# Patient Record
Sex: Female | Born: 1955 | Race: Black or African American | Hispanic: No | Marital: Married | Smoking: Never smoker
Health system: Southern US, Community
[De-identification: ages and names within clinical notes are randomized; demographics above are authoritative.]

## PROBLEM LIST (undated history)

## (undated) DIAGNOSIS — E119 Type 2 diabetes mellitus without complications: Secondary | ICD-10-CM

## (undated) DIAGNOSIS — G4730 Sleep apnea, unspecified: Secondary | ICD-10-CM

## (undated) DIAGNOSIS — T7800XA Anaphylactic reaction due to unspecified food, initial encounter: Secondary | ICD-10-CM

## (undated) DIAGNOSIS — G4733 Obstructive sleep apnea (adult) (pediatric): Secondary | ICD-10-CM

## (undated) DIAGNOSIS — Z9622 Myringotomy tube(s) status: Secondary | ICD-10-CM

## (undated) DIAGNOSIS — J324 Chronic pansinusitis: Secondary | ICD-10-CM

## (undated) DIAGNOSIS — J339 Nasal polyp, unspecified: Secondary | ICD-10-CM

## (undated) DIAGNOSIS — N189 Chronic kidney disease, unspecified: Secondary | ICD-10-CM

## (undated) DIAGNOSIS — H1010 Acute atopic conjunctivitis, unspecified eye: Secondary | ICD-10-CM

## (undated) DIAGNOSIS — M8580 Other specified disorders of bone density and structure, unspecified site: Secondary | ICD-10-CM

## (undated) DIAGNOSIS — T7840XA Allergy, unspecified, initial encounter: Secondary | ICD-10-CM

## (undated) DIAGNOSIS — J45909 Unspecified asthma, uncomplicated: Secondary | ICD-10-CM

## (undated) DIAGNOSIS — H9220 Otorrhagia, unspecified ear: Secondary | ICD-10-CM

## (undated) DIAGNOSIS — J449 Chronic obstructive pulmonary disease, unspecified: Secondary | ICD-10-CM

## (undated) DIAGNOSIS — I10 Essential (primary) hypertension: Secondary | ICD-10-CM

## (undated) DIAGNOSIS — D259 Leiomyoma of uterus, unspecified: Secondary | ICD-10-CM

## (undated) DIAGNOSIS — H7112 Cholesteatoma of tympanum, left ear: Secondary | ICD-10-CM

## (undated) HISTORY — DX: Otorrhagia, unspecified ear: H92.20

## (undated) HISTORY — DX: Other specified disorders of bone density and structure, unspecified site: M85.80

## (undated) HISTORY — PX: DILATION AND CURETTAGE OF UTERUS: SHX78

## (undated) HISTORY — DX: Obstructive sleep apnea (adult) (pediatric): G47.33

## (undated) HISTORY — DX: Unspecified asthma, uncomplicated: J45.909

## (undated) HISTORY — PX: TYMPANOSTOMY TUBE PLACEMENT: SHX32

## (undated) HISTORY — PX: NASAL SINUS SURGERY: SHX719

## (undated) HISTORY — DX: Sleep apnea, unspecified: G47.30

## (undated) HISTORY — DX: Anaphylactic reaction due to unspecified food, initial encounter: T78.00XA

## (undated) HISTORY — DX: Essential (primary) hypertension: I10

## (undated) HISTORY — DX: Allergic rhinitis, unspecified: H10.10

## (undated) HISTORY — DX: Type 2 diabetes mellitus without complications: E11.9

## (undated) HISTORY — DX: Allergy, unspecified, initial encounter: T78.40XA

## (undated) HISTORY — PX: COLONOSCOPY: SHX174

## (undated) HISTORY — DX: Chronic pansinusitis: J32.4

## (undated) HISTORY — DX: Cholesteatoma of tympanum, left ear: H71.12

## (undated) HISTORY — DX: Chronic obstructive pulmonary disease, unspecified: J44.9

## (undated) HISTORY — DX: Leiomyoma of uterus, unspecified: D25.9

## (undated) HISTORY — DX: Chronic kidney disease, unspecified: N18.9

## (undated) HISTORY — DX: Myringotomy tube(s) status: Z96.22

## (undated) HISTORY — DX: Nasal polyp, unspecified: J33.9

---
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
## 1998-01-07 ENCOUNTER — Ambulatory Visit (HOSPITAL_COMMUNITY): Admission: RE | Admit: 1998-01-07 | Discharge: 1998-01-07 | Payer: Self-pay | Admitting: Obstetrics & Gynecology

## 1998-08-14 ENCOUNTER — Ambulatory Visit (HOSPITAL_COMMUNITY): Admission: RE | Admit: 1998-08-14 | Discharge: 1998-08-14 | Payer: Self-pay | Admitting: Obstetrics & Gynecology

## 1998-08-14 ENCOUNTER — Encounter: Payer: Self-pay | Admitting: Obstetrics & Gynecology

## 2000-10-26 ENCOUNTER — Other Ambulatory Visit: Admission: RE | Admit: 2000-10-26 | Discharge: 2000-10-26 | Payer: Self-pay | Admitting: Gynecology

## 2000-11-09 ENCOUNTER — Ambulatory Visit (HOSPITAL_COMMUNITY): Admission: RE | Admit: 2000-11-09 | Discharge: 2000-11-09 | Payer: Self-pay | Admitting: Gynecology

## 2000-11-09 ENCOUNTER — Encounter: Payer: Self-pay | Admitting: Gynecology

## 2001-11-28 ENCOUNTER — Ambulatory Visit (HOSPITAL_COMMUNITY): Admission: RE | Admit: 2001-11-28 | Discharge: 2001-11-28 | Payer: Self-pay | Admitting: Gynecology

## 2001-11-28 ENCOUNTER — Encounter: Payer: Self-pay | Admitting: Gynecology

## 2001-12-20 ENCOUNTER — Other Ambulatory Visit: Admission: RE | Admit: 2001-12-20 | Discharge: 2001-12-20 | Payer: Self-pay | Admitting: Gynecology

## 2002-12-01 ENCOUNTER — Encounter: Payer: Self-pay | Admitting: Gynecology

## 2002-12-01 ENCOUNTER — Ambulatory Visit (HOSPITAL_COMMUNITY): Admission: RE | Admit: 2002-12-01 | Discharge: 2002-12-01 | Payer: Self-pay | Admitting: Gynecology

## 2003-01-03 ENCOUNTER — Other Ambulatory Visit: Admission: RE | Admit: 2003-01-03 | Discharge: 2003-01-03 | Payer: Self-pay | Admitting: Gynecology

## 2003-07-03 ENCOUNTER — Other Ambulatory Visit: Admission: RE | Admit: 2003-07-03 | Discharge: 2003-07-03 | Payer: Self-pay | Admitting: Gynecology

## 2003-12-18 ENCOUNTER — Ambulatory Visit (HOSPITAL_COMMUNITY): Admission: RE | Admit: 2003-12-18 | Discharge: 2003-12-18 | Payer: Self-pay | Admitting: Gynecology

## 2004-01-30 ENCOUNTER — Other Ambulatory Visit: Admission: RE | Admit: 2004-01-30 | Discharge: 2004-01-30 | Payer: Self-pay | Admitting: Gynecology

## 2005-02-02 ENCOUNTER — Ambulatory Visit (HOSPITAL_COMMUNITY): Admission: RE | Admit: 2005-02-02 | Discharge: 2005-02-02 | Payer: Self-pay | Admitting: Gynecology

## 2005-02-02 ENCOUNTER — Other Ambulatory Visit: Admission: RE | Admit: 2005-02-02 | Discharge: 2005-02-02 | Payer: Self-pay | Admitting: Gynecology

## 2006-02-04 ENCOUNTER — Ambulatory Visit (HOSPITAL_COMMUNITY): Admission: RE | Admit: 2006-02-04 | Discharge: 2006-02-04 | Payer: Self-pay | Admitting: Gynecology

## 2006-02-19 ENCOUNTER — Other Ambulatory Visit: Admission: RE | Admit: 2006-02-19 | Discharge: 2006-02-19 | Payer: Self-pay | Admitting: Gynecology

## 2007-03-02 ENCOUNTER — Ambulatory Visit (HOSPITAL_COMMUNITY): Admission: RE | Admit: 2007-03-02 | Discharge: 2007-03-02 | Payer: Self-pay | Admitting: Obstetrics & Gynecology

## 2007-04-18 ENCOUNTER — Ambulatory Visit (HOSPITAL_COMMUNITY): Admission: RE | Admit: 2007-04-18 | Discharge: 2007-04-18 | Payer: Self-pay | Admitting: Internal Medicine

## 2008-03-07 ENCOUNTER — Ambulatory Visit (HOSPITAL_COMMUNITY): Admission: RE | Admit: 2008-03-07 | Discharge: 2008-03-07 | Payer: Self-pay | Admitting: Obstetrics & Gynecology

## 2009-03-13 ENCOUNTER — Ambulatory Visit (HOSPITAL_COMMUNITY): Admission: RE | Admit: 2009-03-13 | Discharge: 2009-03-13 | Payer: Self-pay | Admitting: Obstetrics & Gynecology

## 2009-10-09 ENCOUNTER — Encounter: Admission: RE | Admit: 2009-10-09 | Discharge: 2009-10-09 | Payer: Self-pay | Admitting: Internal Medicine

## 2010-03-21 ENCOUNTER — Ambulatory Visit (HOSPITAL_COMMUNITY): Admission: RE | Admit: 2010-03-21 | Discharge: 2010-03-21 | Payer: Self-pay | Admitting: Obstetrics & Gynecology

## 2011-03-03 ENCOUNTER — Other Ambulatory Visit (HOSPITAL_COMMUNITY): Payer: Self-pay | Admitting: Obstetrics & Gynecology

## 2011-03-03 DIAGNOSIS — Z1231 Encounter for screening mammogram for malignant neoplasm of breast: Secondary | ICD-10-CM

## 2011-03-26 ENCOUNTER — Emergency Department (HOSPITAL_COMMUNITY)
Admission: EM | Admit: 2011-03-26 | Discharge: 2011-03-26 | Disposition: A | Discharge status: Home / Self Care | Payer: BC Managed Care – PPO | Attending: Emergency Medicine | Admitting: Emergency Medicine

## 2011-04-10 ENCOUNTER — Ambulatory Visit (HOSPITAL_COMMUNITY)
Admission: RE | Admit: 2011-04-10 | Discharge: 2011-04-10 | Disposition: A | Discharge status: Home / Self Care | Payer: BC Managed Care – PPO | Source: Ambulatory Visit | Attending: Obstetrics & Gynecology | Admitting: Obstetrics & Gynecology

## 2011-04-10 DIAGNOSIS — Z1231 Encounter for screening mammogram for malignant neoplasm of breast: Secondary | ICD-10-CM | POA: Insufficient documentation

## 2011-08-10 ENCOUNTER — Ambulatory Visit: Payer: BC Managed Care – PPO

## 2011-08-10 ENCOUNTER — Ambulatory Visit (INDEPENDENT_AMBULATORY_CARE_PROVIDER_SITE_OTHER): Payer: BC Managed Care – PPO | Admitting: Internal Medicine

## 2011-08-10 ENCOUNTER — Encounter: Payer: Self-pay | Admitting: Internal Medicine

## 2011-08-10 VITALS — BP 158/78 | HR 73 | Temp 98.4°F | Resp 16 | Ht 67.0 in | Wt 161.0 lb

## 2011-08-10 DIAGNOSIS — E119 Type 2 diabetes mellitus without complications: Secondary | ICD-10-CM | POA: Insufficient documentation

## 2011-08-10 DIAGNOSIS — R059 Cough, unspecified: Secondary | ICD-10-CM

## 2011-08-10 DIAGNOSIS — E782 Mixed hyperlipidemia: Secondary | ICD-10-CM

## 2011-08-10 DIAGNOSIS — I10 Essential (primary) hypertension: Secondary | ICD-10-CM

## 2011-08-10 DIAGNOSIS — Z131 Encounter for screening for diabetes mellitus: Secondary | ICD-10-CM

## 2011-08-10 DIAGNOSIS — R05 Cough: Secondary | ICD-10-CM

## 2011-08-10 DIAGNOSIS — T783XXA Angioneurotic edema, initial encounter: Secondary | ICD-10-CM

## 2011-08-10 DIAGNOSIS — I119 Hypertensive heart disease without heart failure: Secondary | ICD-10-CM | POA: Insufficient documentation

## 2011-08-10 DIAGNOSIS — Z7189 Other specified counseling: Secondary | ICD-10-CM

## 2011-08-10 DIAGNOSIS — Z789 Other specified health status: Secondary | ICD-10-CM

## 2011-08-10 DIAGNOSIS — Z79899 Other long term (current) drug therapy: Secondary | ICD-10-CM

## 2011-08-10 DIAGNOSIS — J45909 Unspecified asthma, uncomplicated: Secondary | ICD-10-CM

## 2011-08-10 DIAGNOSIS — E785 Hyperlipidemia, unspecified: Secondary | ICD-10-CM | POA: Insufficient documentation

## 2011-08-10 DIAGNOSIS — J45901 Unspecified asthma with (acute) exacerbation: Secondary | ICD-10-CM | POA: Insufficient documentation

## 2011-08-10 DIAGNOSIS — Z0000 Encounter for general adult medical examination without abnormal findings: Secondary | ICD-10-CM

## 2011-08-10 LAB — POCT URINALYSIS DIPSTICK
Bilirubin, UA: NEGATIVE
Glucose, UA: NEGATIVE
Ketones, UA: NEGATIVE
Leukocytes, UA: NEGATIVE
Protein, UA: 100

## 2011-08-10 LAB — POCT UA - MICROSCOPIC ONLY
Bacteria, U Microscopic: NEGATIVE
Crystals, Ur, HPF, POC: NEGATIVE
Yeast, UA: NEGATIVE

## 2011-08-10 LAB — GLUCOSE, POCT (MANUAL RESULT ENTRY): POC Glucose: 87

## 2011-08-10 LAB — POCT GLYCOSYLATED HEMOGLOBIN (HGB A1C): Hemoglobin A1C: 6

## 2011-08-10 MED ORDER — EPINEPHRINE 0.3 MG/0.3ML IJ DEVI: 0.3000 mg | Freq: Once | INTRAMUSCULAR | Status: DC

## 2011-08-10 NOTE — Progress Notes (Signed)
Addended by: Anselm Pancoast R on: 08/10/2011 02:59 PM   Modules accepted: Orders

## 2011-08-10 NOTE — Progress Notes (Signed)
  Subjective:    Patient ID: Ashley Villarreal, female    DOB: [birth year], 55 y.o.   MRN: [id number]  HPI Asthma ,allergy stable HTN not to goal Niddm controlled Hyperlipidemia near goal Recovering from laryngitis, sinusitis  See scanned hx.   Review of Systems    See scanned ROS Objective:   Physical Exam  Vitals reviewed. Constitutional: She is oriented to person, place, and time. She appears well-developed and well-nourished. No distress.  HENT:  Head: Normocephalic.  Right Ear: Tympanic membrane normal.  Left Ear: Tympanic membrane normal.  Nose: Mucosal edema and rhinorrhea present.  Mouth/Throat: Uvula is midline, oropharynx is clear and moist and mucous membranes are normal.       Hoarseness  Cardiovascular: Normal rate, regular rhythm and normal heart sounds.   Pulmonary/Chest: Effort normal and breath sounds normal.  Abdominal: Soft. Bowel sounds are normal.  Musculoskeletal: Normal range of motion.  Neurological: She is alert and oriented to person, place, and time. She has normal reflexes. No cranial nerve deficit or sensory deficit.  Skin: Skin is warm and dry.  Psychiatric: Her speech is normal and behavior is normal.   BP 148/77  Results for orders placed in visit on 08/10/11  POCT UA - MICROSCOPIC ONLY      Component Value Range   WBC, Ur, HPF, POC neg     RBC, urine, microscopic 0-2     Bacteria, U Microscopic neg     Mucus, UA trace     Epithelial cells, urine per micros 0-2     Crystals, Ur, HPF, POC neg     Casts, Ur, LPF, POC neg     Yeast, UA neg    POCT URINALYSIS DIPSTICK      Component Value Range   Color, UA yellow     Clarity, UA clear     Glucose, UA neg     Bilirubin, UA neg     Ketones, UA neg     Spec Grav, UA 1.015     Blood, UA trace     pH, UA 7.0     Protein, UA 100     Urobilinogen, UA 1.0     Nitrite, UA neg     Leukocytes, UA Negative    GLUCOSE, POCT (MANUAL RESULT ENTRY)      Component Value Range   POC Glucose 87      POCT GLYCOSYLATED HEMOGLOBIN (HGB A1C)      Component Value Range   Hemoglobin A1C 6.0          Assessment & Plan:  Healthy CPE except for URI Refill meds 1 yr.

## 2011-08-11 ENCOUNTER — Encounter: Payer: Self-pay | Admitting: *Deleted

## 2011-08-11 LAB — COMPREHENSIVE METABOLIC PANEL
ALT: 16 U/L (ref 0–35)
AST: 17 U/L (ref 0–37)
Albumin: 4.1 g/dL (ref 3.5–5.2)
Alkaline Phosphatase: 64 U/L (ref 39–117)
BUN: 12 mg/dL (ref 6–23)
CO2: 30 mEq/L (ref 19–32)
Calcium: 9.3 mg/dL (ref 8.4–10.5)
Chloride: 104 mEq/L (ref 96–112)
Creat: 0.73 mg/dL (ref 0.50–1.10)
Sodium: 142 mEq/L (ref 135–145)
Total Protein: 6.5 g/dL (ref 6.0–8.3)

## 2011-08-11 LAB — CBC WITH DIFFERENTIAL/PLATELET
Eosinophils Relative: 12 % — ABNORMAL HIGH (ref 0–5)
Lymphs Abs: 2.3 10*3/uL (ref 0.7–4.0)
MCHC: 30.7 g/dL (ref 30.0–36.0)
MCV: 86.6 fL (ref 78.0–100.0)
Monocytes Absolute: 0.4 10*3/uL (ref 0.1–1.0)
Neutrophils Relative %: 55 % (ref 43–77)
Platelets: 294 10*3/uL (ref 150–400)
RBC: 4.63 MIL/uL (ref 3.87–5.11)

## 2011-08-11 LAB — LIPID PANEL
LDL Cholesterol: 120 mg/dL — ABNORMAL HIGH (ref 0–99)
Triglycerides: 104 mg/dL (ref <150)

## 2011-08-31 ENCOUNTER — Other Ambulatory Visit: Payer: Self-pay | Admitting: Family Medicine

## 2011-08-31 MED ORDER — LOSARTAN POTASSIUM 100 MG PO TABS: 100.0000 mg | ORAL_TABLET | Freq: Every day | ORAL | Status: DC

## 2011-10-15 ENCOUNTER — Other Ambulatory Visit: Payer: Self-pay | Admitting: Internal Medicine

## 2011-11-08 ENCOUNTER — Other Ambulatory Visit: Payer: Self-pay | Admitting: Internal Medicine

## 2011-11-08 DIAGNOSIS — D259 Leiomyoma of uterus, unspecified: Secondary | ICD-10-CM | POA: Insufficient documentation

## 2012-03-31 ENCOUNTER — Other Ambulatory Visit (HOSPITAL_COMMUNITY): Payer: Self-pay | Admitting: Obstetrics & Gynecology

## 2012-03-31 DIAGNOSIS — Z1231 Encounter for screening mammogram for malignant neoplasm of breast: Secondary | ICD-10-CM

## 2012-04-11 ENCOUNTER — Ambulatory Visit (HOSPITAL_COMMUNITY)
Admission: RE | Admit: 2012-04-11 | Discharge: 2012-04-11 | Disposition: A | Discharge status: Home / Self Care | Payer: BC Managed Care – PPO | Source: Ambulatory Visit | Attending: Obstetrics & Gynecology | Admitting: Obstetrics & Gynecology

## 2012-04-11 DIAGNOSIS — Z1231 Encounter for screening mammogram for malignant neoplasm of breast: Secondary | ICD-10-CM

## 2012-04-23 ENCOUNTER — Ambulatory Visit (INDEPENDENT_AMBULATORY_CARE_PROVIDER_SITE_OTHER): Payer: BC Managed Care – PPO | Admitting: Family Medicine

## 2012-04-23 VITALS — BP 172/75 | HR 78 | Temp 98.3°F | Resp 18 | Ht 68.0 in | Wt 171.4 lb

## 2012-04-23 DIAGNOSIS — E119 Type 2 diabetes mellitus without complications: Secondary | ICD-10-CM

## 2012-04-23 DIAGNOSIS — J329 Chronic sinusitis, unspecified: Secondary | ICD-10-CM

## 2012-04-23 MED ORDER — PREDNISONE 20 MG PO TABS: 40.0000 mg | ORAL_TABLET | Freq: Every day | ORAL | Status: DC

## 2012-04-23 MED ORDER — AMOXICILLIN 875 MG PO TABS: 875.0000 mg | ORAL_TABLET | Freq: Two times a day (BID) | ORAL | Status: DC

## 2012-04-23 NOTE — Progress Notes (Signed)
@[email or web address] @   Patient ID: Ashley Villarreal MRN: [id number], DOB: [birth year], 56 y.o. Date of Encounter: 04/23/2012, 10:05 AM  Primary Physician: No primary provider on file.  Chief Complaint:  Chief Complaint  Patient presents with  . Bronchitis    Hx of Bronchitix and Pneumonia; sinus press; chest congestion; cough - brown; wheezing x 3 dys    HPI: 56 y.o. year old female presents with 3 day history of nasal congestion, post nasal drip, sore throat, sinus pressure, and cough. Afebrile. No chills. Nasal congestion thick and green/yellow. Sinus pressure is the worst symptom. Cough is productive secondary to post nasal drip and not associated with time of day. Ears feel full, leading to sensation of muffled hearing. Has tried OTC cold preps without success. No GI complaints. Appetite okay.  Blood sugars running "normal" Has not had problems with prednisone in the past.  No recent antibiotics, recent travels, or sick contacts   No leg trauma, sedentary periods, h/o cancer, or tobacco use.  Past Medical History  Diagnosis Date  . Allergy   . Diabetes mellitus without complication   . Asthma   . Hypertension      Home Meds: Prior to Admission medications   Medication Sig Start Date End Date Taking? Authorizing Provider  atorvastatin (LIPITOR) 10 MG tablet TAKE 1 TABLET BY MOUTH AT BEDTIME 10/15/11  Yes Rickard Patience, PA-C  BYSTOLIC 10 MG tablet TAKE 1 TABLET EVERY MORNING 11/08/11  Yes Jonita Albee, MD  EPINEPHrine (EPIPEN) 0.3 mg/0.3 mL DEVI Inject 0.3 mLs (0.3 mg total) into the muscle once. 08/10/11  Yes Jonita Albee, MD  losartan (COZAAR) 100 MG tablet Take 1 tablet (100 mg total) by mouth daily. 08/31/11  Yes Morrell Riddle, PA-C  metFORMIN (GLUCOPHAGE) 500 MG tablet TAKE 1 TABLET BY MOUTH 2 TIMES A DAY 10/15/11  Yes Rickard Patience, PA-C  omeprazole-sodium bicarbonate (ZEGERID) 40-1100 MG per capsule  06/13/11  Yes Historical Provider, MD  ONE TOUCH ULTRA TEST test strip  05/24/11   Yes Historical Provider, MD  [facility] 160-4.5 MCG/ACT inhaler  06/24/11  Yes Historical Provider, MD  [facility]off[facility] Paradise 150 MG injection  07/31/11  Yes Historical Provider, MD    Allergies:  Allergies  Allergen Reactions  . Loratadine     dizziness    History   Social History  . Marital Status: Married    Spouse Name: N/A    Number of Children: N/A  . Years of Education: N/A   Occupational History  . Not on file.   Social History Main Topics  . Smoking status: Never Smoker   . Smokeless tobacco: Not on file  . Alcohol Use: No  . Drug Use: No  . Sexually Active: Not on file   Other Topics Concern  . Not on file   Social History Narrative  . No narrative on file     Review of Systems: Constitutional: negative for chills, fever, night sweats or weight changes Cardiovascular: negative for chest pain or palpitations Respiratory: negative for hemoptysis, wheezing, or shortness of breath Abdominal: negative for abdominal pain, nausea, vomiting or diarrhea Dermatological: negative for rash Neurologic: negative for headache   Physical Exam: Blood pressure 172/75, pulse 78, temperature 98.3 F (36.8 C), temperature source Oral, resp. rate 18, height 5\' 8"  (1.727 m), weight 171 lb 6.4 oz (77.747 kg), SpO2 96.00%., Body mass index is 26.06 kg/(m^2). General: Well developed, well nourished, in no acute distress. Head: Normocephalic, atraumatic, eyes without discharge, sclera non-icteric,  nares are congested. Bilateral auditory canals clear, TM's are without perforation, pearly grey with reflective cone of light bilaterally. Serous effusion bilaterally behind TM's. Maxillary sinus TTP. Oral cavity moist, dentition normal. Posterior pharynx with post nasal drip and mild erythema. No peritonsillar abscess or tonsillar exudate. Neck: Supple. No thyromegaly. Full ROM. No lymphadenopathy. Lungs: Clear bilaterally to auscultation without wheezes, rales, or rhonchi. Breathing is unlabored.  Heart:  RRR with S1 S2. No murmurs, rubs, or gallops appreciated. Msk:  Strength and tone normal for age. Extremities: No clubbing or cyanosis. No edema. Neuro: Alert and oriented X 3. Moves all extremities spontaneously. CNII-XII grossly in tact. Psych:  Responds to questions appropriately with a normal affect.   Labs:   ASSESSMENT AND PLAN:  56 y.o. year old female with sinusitis -  -Tylenol/Motrin prn -Rest/fluids -RTC precautions -RTC 3-5 days if no improvement  Signed, Elvina Sidle, MD 04/23/2012 10:05 AM

## 2012-04-23 NOTE — Patient Instructions (Addendum)

## 2012-05-02 ENCOUNTER — Other Ambulatory Visit: Payer: Self-pay | Admitting: Physician Assistant

## 2012-05-04 ENCOUNTER — Ambulatory Visit (INDEPENDENT_AMBULATORY_CARE_PROVIDER_SITE_OTHER): Payer: BC Managed Care – PPO | Admitting: Family Medicine

## 2012-05-04 ENCOUNTER — Ambulatory Visit: Payer: BC Managed Care – PPO

## 2012-05-04 VITALS — BP 153/79 | HR 86 | Resp 18 | Ht 68.25 in | Wt 171.0 lb

## 2012-05-04 DIAGNOSIS — R0789 Other chest pain: Secondary | ICD-10-CM

## 2012-05-04 DIAGNOSIS — R079 Chest pain, unspecified: Secondary | ICD-10-CM

## 2012-05-04 DIAGNOSIS — R071 Chest pain on breathing: Secondary | ICD-10-CM

## 2012-05-04 MED ORDER — METHYLPREDNISOLONE ACETATE 80 MG/ML IJ SUSP
80.0000 mg | Freq: Once | INTRAMUSCULAR | Status: AC
  Administered 2012-05-04: 80 mg via INTRAMUSCULAR

## 2012-05-04 NOTE — Progress Notes (Signed)
Chest Pain: Patient complains of chest pain. Onset was 1 day ago, with unchanged course since that time. The patient describes the pain as constant, costochondral in nature, does not radiate. Patient rates pain as a 5/10 in intensity.  Associated symptoms are none. Aggravating factors are exercise and palpation of chest.  Alleviating factors are: rest. Patient's cardiac risk factors are diabetes mellitus, hypertension and microalbuminuria.  Patient's risk factors for DVT/PE: none. Previous cardiac testing: none.  The patient does give history though of having a cold approximately 2-3 weeks ago. Patient was seen was given Augmentin as well as prednisone. Patient has a past medical history for asthma but does not feel like she is wheezing or having shortness of breath.  Patient also denies any fever, chills, any sweating, any cough out of the ordinary. Patient did have a cough though when she did have her cold.  Significant past medical history includes diabetes, asthma, and hypertension. Patient also had an abnormal chest x-ray in the past which was worked up with a CT scan which was unremarkable.  Lab Results  Component Value Date   HGBA1C 6.0 08/10/2011   EKG was ordered in view of by me today. Patient has sinus rhythm with regular activities. Patient has no significant QRS or T-wave inversions. Normal EKG.  Chest x-ray was ordered and reviewed by me today. Patient does have cardiomegaly but this seems to be stable from previous x-ray. There is no infiltrate or any other concerning abnormality.  Physical exam Blood pressure 153/79, pulse 86, resp. rate 18, height 5' 8.25" (1.734 m), weight 171 lb (77.565 kg), SpO2 100.00%. General: No apparent distress alert and oriented x3 mood and affect normal. Cardiovascular: Regular rate and rhythm no murmur appreciated Pulmonary: Clear to auscultation bilaterally HEENT: Patient does have a postnasal drip otherwise unremarkable with moist mucous  membranes. Abdomen: Bowel sounds positive nontender nondistended no palpable mass no pulsatile mass. Chest wall exam: Patient is severely tender on the left side of the sternum at the costochondral insertion mostly of her ribs 3 through 6.  Assessment: Costochondritis  Plan: Patient does have risk factors of diabetes and hypertension for cardiac disease. Patient though does not presenting such as coronary artery disease. Patient does not have any lower extremity edema and cardiomegaly appears to be stable. Patient is not short of breath and I did not think that this is an asthma exacerbation. At this point with her having palpable tenderness over the costochondral regions I do think this is costochondritis. Patient is given a shot of Depo-Medrol today. Patient given home modalities that she can do including icing that could be helping. Patient's nose a red flags and when to seek medical attention. Patient will followup again next Friday for further evaluation. At that time as well she will get a flu shot.

## 2012-05-04 NOTE — Patient Instructions (Signed)
Your EKG as well as your chest x-ray are normal. Come back next Friday for your flu shot as well as reevaluation.   Costochondritis Costochondritis (Tietze syndrome), or costochondral separation, is a swelling and irritation (inflammation) of the tissue (cartilage) that connects your ribs with your breastbone (sternum). It may occur on its own (spontaneously), through damage caused by an accident (trauma), or simply from coughing or minor exercise. It may take up to 6 weeks to get better and longer if you are unable to be conservative in your activities. HOME CARE INSTRUCTIONS   Avoid exhausting physical activity. Try not to strain your ribs during normal activity. This would include any activities using chest, belly (abdominal), and side muscles, especially if heavy weights are used.  Use ice for 15 to 20 minutes per hour while awake for the first 2 days. Place the ice in a plastic bag, and place a towel between the bag of ice and your skin.  Only take over-the-counter or prescription medicines for pain, discomfort, or fever as directed by your caregiver. SEEK IMMEDIATE MEDICAL CARE IF:   Your pain increases or you are very uncomfortable.  You have a fever.  You develop difficulty with your breathing.  You cough up blood.  You develop worse chest pains, shortness of breath, sweating, or vomiting.  You develop new, unexplained problems (symptoms). MAKE SURE YOU:   Understand these instructions.  Will watch your condition.  Will get help right away if you are not doing well or get worse. Document Released: 03/18/2005 Document Revised: 08/31/2011 Document Reviewed: 01/25/2008 [facility] Patient Information 2013 [place], [place].

## 2012-05-04 NOTE — Progress Notes (Signed)
Patient discussed with Dr. Katrinka Blazing.EKG reviewed.  Agree with assessment and plan of care per his note. Radiology overread: IMPRESSION:  1. No acute cardiopulmonary abnormalities.

## 2012-05-14 ENCOUNTER — Other Ambulatory Visit: Payer: Self-pay | Admitting: Physician Assistant

## 2012-07-06 ENCOUNTER — Other Ambulatory Visit: Payer: Self-pay | Admitting: Internal Medicine

## 2012-07-11 ENCOUNTER — Ambulatory Visit (INDEPENDENT_AMBULATORY_CARE_PROVIDER_SITE_OTHER): Payer: BC Managed Care – PPO | Admitting: Emergency Medicine

## 2012-07-11 VITALS — BP 140/82 | HR 77 | Temp 98.6°F | Resp 16 | Ht 67.0 in | Wt 161.0 lb

## 2012-07-11 DIAGNOSIS — A088 Other specified intestinal infections: Secondary | ICD-10-CM

## 2012-07-11 MED ORDER — LOPERAMIDE HCL 2 MG PO TABS: ORAL_TABLET | ORAL | Status: DC

## 2012-07-11 MED ORDER — ONDANSETRON 8 MG PO TBDP: 8.0000 mg | ORAL_TABLET | Freq: Three times a day (TID) | ORAL | Status: DC | PRN

## 2012-07-11 NOTE — Patient Instructions (Addendum)
Viral Gastroenteritis Viral gastroenteritis is also known as stomach flu. This condition affects the stomach and intestinal tract. It can cause sudden diarrhea and vomiting. The illness typically lasts 3 to 8 days. Most people develop an immune response that eventually gets rid of the virus. While this natural response develops, the virus can make you quite ill. CAUSES  Many different viruses can cause gastroenteritis, such as rotavirus or noroviruses. You can catch one of these viruses by consuming contaminated food or water. You may also catch a virus by sharing utensils or other personal items with an infected person or by touching a contaminated surface. SYMPTOMS  The most common symptoms are diarrhea and vomiting. These problems can cause a severe loss of body fluids (dehydration) and a body salt (electrolyte) imbalance. Other symptoms may include:  Fever.  Headache.  Fatigue.  Abdominal pain. DIAGNOSIS  Your caregiver can usually diagnose viral gastroenteritis based on your symptoms and a physical exam. A stool sample may also be taken to test for the presence of viruses or other infections. TREATMENT  This illness typically goes away on its own. Treatments are aimed at rehydration. The most serious cases of viral gastroenteritis involve vomiting so severely that you are not able to keep fluids down. In these cases, fluids must be given through an intravenous line (IV). HOME CARE INSTRUCTIONS   Drink enough fluids to keep your urine clear or pale yellow. Drink small amounts of fluids frequently and increase the amounts as tolerated.  Ask your caregiver for specific rehydration instructions.  Avoid:  Foods high in sugar.  Alcohol.  Carbonated drinks.  Tobacco.  Juice.  Caffeine drinks.  Extremely hot or cold fluids.  Fatty, greasy foods.  Too much intake of anything at one time.  Dairy products until 24 to 48 hours after diarrhea stops.  You may consume probiotics.  Probiotics are active cultures of beneficial bacteria. They may lessen the amount and number of diarrheal stools in adults. Probiotics can be found in yogurt with active cultures and in supplements.  Wash your hands well to avoid spreading the virus.  Only take over-the-counter or prescription medicines for pain, discomfort, or fever as directed by your caregiver. Do not give aspirin to children. Antidiarrheal medicines are not recommended.  Ask your caregiver if you should continue to take your regular prescribed and over-the-counter medicines.  Keep all follow-up appointments as directed by your caregiver. SEEK IMMEDIATE MEDICAL CARE IF:   You are unable to keep fluids down.  You do not urinate at least once every 6 to 8 hours.  You develop shortness of breath.  You notice blood in your stool or vomit. This may look like coffee grounds.  You have abdominal pain that increases or is concentrated in one small area (localized).  You have persistent vomiting or diarrhea.  You have a fever.  The patient is a child younger than 3 months, and he or she has a fever.  The patient is a child older than 3 months, and he or she has a fever and persistent symptoms.  The patient is a child older than 3 months, and he or she has a fever and symptoms suddenly get worse.  The patient is a baby, and he or she has no tears when crying. MAKE SURE YOU:   Understand these instructions.  Will watch your condition.  Will get help right away if you are not doing well or get worse. Document Released: 06/08/2005 Document Revised: 08/31/2011 Document Reviewed: 03/25/2011   [employer] Patient Information 2013 [employer].  

## 2012-07-11 NOTE — Progress Notes (Signed)
[facility] [address], [place] [address] [phone]  Date:  07/11/2012   Name:  Ashley Villarreal   DOB:  04-Jun-1956   MRN:  [id number]  PCP:  Tally Due, MD    Chief Complaint: Abdominal Cramping, Nausea and Diarrhea   History of Present Illness:  Ashley Villarreal is a 57 y.o. very pleasant female patient who presents with the following:  Has been fasting for three weeks through church.  Eating only fruits and vegetables.  Has had nausea and diarrhea since last week.  No vomiting.  No fever or chills.  No muscle aches or joints.  No sick contacts.  Denies blood mucous or pus in stools, no black stools.  No improvement with pepto bismol.   No specific food intolerance.  No fried food.  Patient Active Problem List  Diagnosis  . HTN (hypertension)  . DM (diabetes mellitus screen)  . Hyperlipemia  . Asthma  . Allergy history unknown    Past Medical History  Diagnosis Date  . Allergy   . Diabetes mellitus without complication   . Asthma   . Hypertension     Past Surgical History  Procedure Date  . Tonsillectomy 1975    History  Substance Use Topics  . Smoking status: Never Smoker   . Smokeless tobacco: Not on file  . Alcohol Use: No    Family History  Problem Relation Age of Onset  . Stroke Father     Allergies  Allergen Reactions  . Loratadine     dizziness    Medication list has been reviewed and updated.  Current Outpatient Prescriptions on File Prior to Visit  Medication Sig Dispense Refill  . atorvastatin (LIPITOR) 10 MG tablet Take 1 tablet (10 mg total) by mouth daily. Needs office visit  30 tablet  0  . BYSTOLIC 10 MG tablet TAKE 1 TABLET EVERY MORNING  90 tablet  2  . EPINEPHrine (EPIPEN) 0.3 mg/0.3 mL DEVI Inject 0.3 mLs (0.3 mg total) into the muscle once.  2 Device  3  . losartan (COZAAR) 100 MG tablet TAKE 1 TABLET (100 MG TOTAL) BY MOUTH DAILY.  90 tablet  1  . losartan (COZAAR) 100 MG tablet TAKE 1  TABLET (100 MG TOTAL) BY MOUTH DAILY.  90 tablet  0  . metFORMIN (GLUCOPHAGE) 500 MG tablet Take 1 tablet (500 mg total) by mouth 2 (two) times daily with a meal. Needs office visit  60 tablet  0  . omeprazole-sodium bicarbonate (ZEGERID) 40-1100 MG per capsule       . SYMBICORT 160-4.5 MCG/ACT inhaler       . XOLAIR 150 MG injection       . ONE TOUCH ULTRA TEST test strip       . predniSONE (DELTASONE) 20 MG tablet Take 2 tablets (40 mg total) by mouth daily.  10 tablet  0    Review of Systems: As per HPI, otherwise negative.    Physical Examination: Filed Vitals:   07/11/12 1405  BP: 140/82  Pulse: 77  Temp: 98.6 F (37 C)  Resp: 16   Filed Vitals:   07/11/12 1405  Height: 5\' 7"  (1.702 m)  Weight: 161 lb (73.029 kg)   Body mass index is 25.22 kg/(m^2). Ideal Body Weight: Weight in (lb) to have BMI = 25: 159.3   GEN: WDWN, NAD, Non-toxic, A & O x 3 HEENT: Atraumatic, Normocephalic. Neck supple. No masses, No LAD. Ears and  Nose: No external deformity. CV: RRR, No M/G/R. No JVD. No thrill. No extra heart sounds. PULM: CTA B, no wheezes, crackles, rhonchi. No retractions. No resp. distress. No accessory muscle use. ABD: S, diffuse tenderness, ND, +BS. No rebound. No HSM. EXTR: No c/c/e NEURO Normal gait.  PSYCH: Normally interactive. Conversant. Not depressed or anxious appearing.  Calm demeanor.    Assessment and Plan: Gastroenteritis zofran Imodium Fluids   Carmelina Dane, MD

## 2012-07-16 ENCOUNTER — Ambulatory Visit (INDEPENDENT_AMBULATORY_CARE_PROVIDER_SITE_OTHER): Payer: BC Managed Care – PPO | Admitting: Physician Assistant

## 2012-07-16 VITALS — BP 150/80 | HR 88 | Temp 98.2°F | Resp 18 | Wt 163.0 lb

## 2012-07-16 DIAGNOSIS — J069 Acute upper respiratory infection, unspecified: Secondary | ICD-10-CM

## 2012-07-16 MED ORDER — IPRATROPIUM BROMIDE 0.03 % NA SOLN: 2.0000 | Freq: Three times a day (TID) | NASAL | Status: DC

## 2012-07-16 MED ORDER — HYDROCOD POLST-CHLORPHEN POLST 10-8 MG/5ML PO LQCR: 5.0000 mL | Freq: Two times a day (BID) | ORAL | Status: DC

## 2012-07-16 NOTE — Progress Notes (Signed)
   [address] [place] [address]   Phone [phone]  Subjective:    Patient ID: Ashley Villarreal, female    DOB: May 20, 1956, 57 y.o.   MRN: [id number]  HPI Pt presents to clinic with sinus congestion and cough for the last 24h.  She was terribly congestion last pm and took a left over prednisone and in the middle of the night she was able to breath through her nose.  She has some yellow rhinorrhea.  She has a cough that is relieved by her Xopenex but she does not feel like her asthma is acting up.  She has no SOB or wheezing.  Productive cough from PND.  She has used no otc medications.   Review of Systems  Constitutional: Negative for chills and fatigue.  HENT: Positive for congestion (yellow), rhinorrhea, postnasal drip and sinus pressure (better this am). Negative for sore throat.   Respiratory: Positive for cough. Negative for shortness of breath and wheezing.   Neurological: Positive for dizziness. Negative for headaches.       Objective:   Physical Exam  Vitals reviewed. Constitutional: She is oriented to person, place, and time. She appears well-developed and well-nourished.  HENT:  Head: Normocephalic and atraumatic.  Right Ear: Hearing, tympanic membrane, external ear and ear canal normal.  Left Ear: Hearing, tympanic membrane, external ear and ear canal normal.  Nose: Mucosal edema present.  Mouth/Throat: Uvula is midline, oropharynx is clear and moist and mucous membranes are normal.  Eyes: Conjunctivae normal are normal.  Neck: Neck supple.  Cardiovascular: Normal rate, regular rhythm and normal heart sounds.   Pulmonary/Chest: Effort normal and breath sounds normal. No respiratory distress. She has no wheezes.  Lymphadenopathy:    She has no cervical adenopathy.  Neurological: She is alert and oriented to person, place, and time.  Skin: Skin is warm and dry.  Psychiatric: She has a normal mood and affect. Her behavior is normal. Judgment and thought content  normal.          Assessment & Plan:   1. Viral URI with cough  chlorpheniramine-HYDROcodone (TUSSIONEX PENNKINETIC ER) 10-8 MG/5ML LQCR, ipratropium (ATROVENT) 0.03 % nasal spray   Due to symptoms only being <24h I do not believe the patient has a bacterial sinus infection, I think that she has a viral URI.  We do have to be careful with her asthma but at this time she should continue to use her rescue inhaler and monitor.  If she is not better in 48h with symptomatic care she should call with her symptoms and an antibiotic may be called in.  If her asthma starts to cause problems she should RTC because it is not a problem today.

## 2012-08-17 ENCOUNTER — Telehealth: Payer: Self-pay

## 2012-08-17 ENCOUNTER — Other Ambulatory Visit: Payer: Self-pay | Admitting: Physician Assistant

## 2012-08-17 ENCOUNTER — Other Ambulatory Visit: Payer: Self-pay | Admitting: Internal Medicine

## 2012-08-17 NOTE — Telephone Encounter (Signed)
Pt called today, she had an appt with Dr. Perrin Maltese for March 10 but he cancelled the clinic. She is already scheduled for her CPE with him for April 14 and he has no other appts before that date. She has multiple medicines that need refilling and is contacting [facility] [address] for refills. Just wanted [place] to have this info when considering the refill request.

## 2012-08-17 NOTE — Telephone Encounter (Signed)
What meds does she require refills on?

## 2012-08-18 NOTE — Telephone Encounter (Signed)
She has several meds and did not have all of their names, she was going to contact [facility] so they would send [place] the specific refill info.

## 2012-08-29 ENCOUNTER — Ambulatory Visit: Payer: BC Managed Care – PPO | Admitting: Internal Medicine

## 2012-09-13 ENCOUNTER — Other Ambulatory Visit: Payer: Self-pay | Admitting: Physician Assistant

## 2012-09-13 ENCOUNTER — Other Ambulatory Visit: Payer: Self-pay

## 2012-09-13 MED ORDER — ATORVASTATIN CALCIUM 10 MG PO TABS: 10.0000 mg | ORAL_TABLET | Freq: Every day | ORAL | Status: DC

## 2012-09-24 ENCOUNTER — Ambulatory Visit (INDEPENDENT_AMBULATORY_CARE_PROVIDER_SITE_OTHER): Payer: BC Managed Care – PPO | Admitting: Physician Assistant

## 2012-09-24 VITALS — BP 169/75 | HR 76 | Temp 98.3°F | Resp 16 | Ht 67.25 in | Wt 161.2 lb

## 2012-09-24 DIAGNOSIS — J029 Acute pharyngitis, unspecified: Secondary | ICD-10-CM

## 2012-09-24 DIAGNOSIS — J069 Acute upper respiratory infection, unspecified: Secondary | ICD-10-CM

## 2012-09-24 DIAGNOSIS — M940 Chondrocostal junction syndrome [Tietze]: Secondary | ICD-10-CM

## 2012-09-24 LAB — POCT RAPID STREP A (OFFICE): Rapid Strep A Screen: NEGATIVE

## 2012-09-24 MED ORDER — IPRATROPIUM BROMIDE 0.03 % NA SOLN: 2.0000 | Freq: Three times a day (TID) | NASAL | Status: DC

## 2012-09-24 MED ORDER — FIRST-DUKES MOUTHWASH MT SUSP: 10.0000 mL | OROMUCOSAL | Status: DC | PRN

## 2012-09-24 MED ORDER — AMOXICILLIN 875 MG PO TABS: 875.0000 mg | ORAL_TABLET | Freq: Two times a day (BID) | ORAL | Status: DC

## 2012-09-24 NOTE — Patient Instructions (Addendum)
Begin using the Magic Mouthwash every 2 hours if needed for sore throat.  Additionally you may use 600mg  ibuprofen every 8 hours with food for sore throat and costochondritis (chest pain).  Continue using the Atrovent nasal spray to help with runny nose and post-nasal drainage.  I would stay away from cold medicines because they are elevating your blood pressure.  I have sent amoxicillin to your pharmacy - start this if you are feeling worse, or if you hear from me that your culture is positive for strep.  If you begin this medication, you need to be sure to finish the full course.  Plenty of fluids and rest.  Please let me know if anything is worsening or not improving

## 2012-09-24 NOTE — Progress Notes (Signed)
Subjective:    Patient ID: Ashley Villarreal, female    DOB: [birth year], 57 y.o.   MRN: [id number]  HPI   Ms. Ashley Villarreal is a 57 yr old female here with concern for illness.  "I have a terrible sore throat."   This started 5 days ago.  States someone was sick with strep at work.  In addition to the sore throat she endorses cough, ear fullness, nasal congestion, post-nasal drainage, and headache.  She denies fevers or chills.  Denies GI symptoms, body aches, or muscle aches.  She does state she is having some chest pain that started at the same time as the sore throat and cough.  Her chest is tender to the touch.  States this feels similar to an episode of costochondritis that she had several months ago.  Denies radiation of the pain, dyspnea, dizziness, diaphoresis.   Review of Systems  Constitutional: Negative for fever and diaphoresis.  HENT: Positive for ear pain, congestion, sore throat, rhinorrhea and postnasal drip. Negative for sinus pressure.   Respiratory: Positive for cough. Negative for shortness of breath and wheezing.   Cardiovascular: Positive for chest pain.  Gastrointestinal: Negative.   Musculoskeletal: Negative.   Skin: Negative.   Neurological: Positive for headaches.       Objective:   Physical Exam  Vitals reviewed. Constitutional: She is oriented to person, place, and time. She appears well-developed and well-nourished. No distress.  HENT:  Head: Normocephalic and atraumatic.  Right Ear: Tympanic membrane and ear canal normal.  Left Ear: Tympanic membrane and ear canal normal.  Nose: Mucosal edema present. Right sinus exhibits no maxillary sinus tenderness and no frontal sinus tenderness. Left sinus exhibits no maxillary sinus tenderness and no frontal sinus tenderness.  Mouth/Throat: Uvula is midline and mucous membranes are normal. Oropharyngeal exudate and posterior oropharyngeal erythema present. No posterior oropharyngeal edema or tonsillar abscesses.  Eyes:  Conjunctivae are normal. No scleral icterus.  Neck: Neck supple.  Cardiovascular: Normal rate, regular rhythm and normal heart sounds.  Exam reveals no gallop and no friction rub.   No murmur heard. Pulmonary/Chest: Effort normal and breath sounds normal. She has no wheezes. She has no rales. She exhibits tenderness.  Lymphadenopathy:    She has cervical adenopathy.  Neurological: She is alert and oriented to person, place, and time.  Skin: Skin is warm and dry.  Psychiatric: She has a normal mood and affect. Her behavior is normal.     Filed Vitals:   09/24/12 0912  BP: 169/75  Pulse: 76  Temp: 98.3 F (36.8 C)  Resp: 16     Results for orders placed in visit on 09/24/12  POCT RAPID STREP A (OFFICE)      Result Value Range   Rapid Strep A Screen Negative  Negative        Assessment & Plan:  Sore throat - Plan: POCT rapid strep A, Culture, Group A Strep, amoxicillin (AMOXIL) 875 MG tablet, Diphenhyd-Hydrocort-Nystatin (FIRST-DUKES MOUTHWASH) SUSP  -- Rapid strep is negative.  There is a small amount of exudate in the oropharynx.  There is some tender anterior cervical adenopathy.  Pt also has URI symptoms.  I have sent a throat culture to the lab.  Suspect that this is viral, but given some concerning symptoms with her known exposure I have sent amoxicillin to the pharmacy.  Discussed with pt that she could start the today if she would like, or she may wait to hear final culture results.  Magic Mouthwash  and ibuprofen for pain relief.  Push fluids.    Viral URI with cough - Plan: ipratropium (ATROVENT) 0.03 % nasal spray  -- Atrovent nasal spray for relief of nasal congestion, post-nasal drainage, and ear fullness.  Encouraged pt to refrain from otc cold preps due to elevation of BP.  Push fluids, rest.    Costochondritis  -- Pt complaining of chest pain with onset of sore throat and cough.  Feels similar to previous episode of costochronditis.  Pain is reproducible on  palpation of anterior chest.  Symptoms are not concerning for ACS.  Will treat with NSAIDs.  If worsening, not improving, or if new symptoms develop, pt will RTC.

## 2012-09-26 LAB — CULTURE, GROUP A STREP: Organism ID, Bacteria: NORMAL

## 2012-10-03 ENCOUNTER — Encounter: Payer: Self-pay | Admitting: Internal Medicine

## 2012-10-03 ENCOUNTER — Ambulatory Visit (INDEPENDENT_AMBULATORY_CARE_PROVIDER_SITE_OTHER): Payer: BC Managed Care – PPO | Admitting: Internal Medicine

## 2012-10-03 VITALS — BP 144/96 | HR 84 | Temp 98.5°F | Resp 16 | Ht 67.0 in | Wt 158.8 lb

## 2012-10-03 DIAGNOSIS — Z23 Encounter for immunization: Secondary | ICD-10-CM

## 2012-10-03 DIAGNOSIS — E119 Type 2 diabetes mellitus without complications: Secondary | ICD-10-CM

## 2012-10-03 DIAGNOSIS — I10 Essential (primary) hypertension: Secondary | ICD-10-CM

## 2012-10-03 DIAGNOSIS — J45909 Unspecified asthma, uncomplicated: Secondary | ICD-10-CM

## 2012-10-03 DIAGNOSIS — T7840XD Allergy, unspecified, subsequent encounter: Secondary | ICD-10-CM

## 2012-10-03 DIAGNOSIS — Z0000 Encounter for general adult medical examination without abnormal findings: Secondary | ICD-10-CM

## 2012-10-03 DIAGNOSIS — Z7189 Other specified counseling: Secondary | ICD-10-CM

## 2012-10-03 DIAGNOSIS — Z79899 Other long term (current) drug therapy: Secondary | ICD-10-CM

## 2012-10-03 DIAGNOSIS — J301 Allergic rhinitis due to pollen: Secondary | ICD-10-CM

## 2012-10-03 LAB — POCT URINALYSIS DIPSTICK
Glucose, UA: NEGATIVE
Nitrite, UA: NEGATIVE
Protein, UA: >=300
Spec Grav, UA: >=1.03
Urobilinogen, UA: 0.2

## 2012-10-03 LAB — CBC WITH DIFFERENTIAL/PLATELET
Eosinophils Relative: 7 % — ABNORMAL HIGH (ref 0–5)
HCT: 39.8 % (ref 36.0–46.0)
Hemoglobin: 13 g/dL (ref 12.0–15.0)
Lymphocytes Relative: 27 % (ref 12–46)
Lymphs Abs: 2.2 10*3/uL (ref 0.7–4.0)
MCV: 82.9 fL (ref 78.0–100.0)
Monocytes Absolute: 0.5 10*3/uL (ref 0.1–1.0)
Monocytes Relative: 6 % (ref 3–12)
Platelets: 305 10*3/uL (ref 150–400)
RBC: 4.8 MIL/uL (ref 3.87–5.11)
WBC: 8.1 10*3/uL (ref 4.0–10.5)

## 2012-10-03 LAB — GLUCOSE, POCT (MANUAL RESULT ENTRY): POC Glucose: 113 mg/dl — AB (ref 70–99)

## 2012-10-03 LAB — COMPREHENSIVE METABOLIC PANEL
ALT: 17 U/L (ref 0–35)
CO2: 28 mEq/L (ref 19–32)
Calcium: 9.6 mg/dL (ref 8.4–10.5)
Chloride: 103 mEq/L (ref 96–112)
Creat: 0.79 mg/dL (ref 0.50–1.10)

## 2012-10-03 LAB — LIPID PANEL
Cholesterol: 171 mg/dL (ref 0–200)
Total CHOL/HDL Ratio: 4.3 Ratio

## 2012-10-03 LAB — POCT UA - MICROSCOPIC ONLY
Casts, Ur, LPF, POC: NEGATIVE
Crystals, Ur, HPF, POC: NEGATIVE
Yeast, UA: NEGATIVE

## 2012-10-03 MED ORDER — LOSARTAN POTASSIUM 100 MG PO TABS: 100.0000 mg | ORAL_TABLET | Freq: Every day | ORAL | Status: DC

## 2012-10-03 MED ORDER — METFORMIN HCL 500 MG PO TABS: 500.0000 mg | ORAL_TABLET | Freq: Two times a day (BID) | ORAL | Status: DC

## 2012-10-03 MED ORDER — METHYLPREDNISOLONE ACETATE 80 MG/ML IJ SUSP
80.0000 mg | Freq: Once | INTRAMUSCULAR | Status: AC
  Administered 2012-10-03: 80 mg via INTRAMUSCULAR

## 2012-10-03 MED ORDER — PREDNISONE 10 MG PO TABS: ORAL_TABLET | ORAL | Status: DC

## 2012-10-03 MED ORDER — SIMVASTATIN 10 MG PO TABS: 10.0000 mg | ORAL_TABLET | Freq: Every day | ORAL | Status: DC

## 2012-10-03 MED ORDER — CHLORTHALIDONE 25 MG PO TABS: 25.0000 mg | ORAL_TABLET | Freq: Every day | ORAL | Status: DC

## 2012-10-03 MED ORDER — NEBIVOLOL HCL 10 MG PO TABS: 10.0000 mg | ORAL_TABLET | Freq: Every day | ORAL | Status: DC

## 2012-10-03 NOTE — Progress Notes (Signed)
  Subjective:    Patient ID: Ashley Villarreal, female    DOB: 01/06/1956, 57 y.o.   MRN: [id number]  HPI Struggling with allergys and asthma daily, BP not to goal, DM is controlled nicely. Needs CPE today, needs all meds rfed.  Mammogram colonoscopy UTD. Needs Tdap   Review of Systems  Constitutional: Negative.   HENT: Positive for congestion, rhinorrhea and sneezing.   Eyes: Negative.   Respiratory: Positive for cough and wheezing.   Cardiovascular: Negative.   Gastrointestinal: Negative.   Endocrine: Negative.   Genitourinary: Negative.   Musculoskeletal: Negative.   Allergic/Immunologic: Positive for environmental allergies.  Neurological: Positive for speech difficulty.  Hematological: Negative.   Psychiatric/Behavioral: Negative.        Objective:   Physical Exam  Vitals reviewed. Constitutional: She is oriented to person, place, and time. She appears well-developed and well-nourished. No distress.  HENT:  Right Ear: External ear normal.  Left Ear: External ear normal.  Nose: Nose normal.  Mouth/Throat: Oropharynx is clear and moist.  Eyes: Conjunctivae and EOM are normal. Pupils are equal, round, and reactive to light. Right eye exhibits no discharge. Left eye exhibits no discharge.  Neck: Normal range of motion. Neck supple. No tracheal deviation present. No thyromegaly present.  Cardiovascular: Normal rate, regular rhythm, normal heart sounds and intact distal pulses.   No murmur heard. Pulmonary/Chest: Effort normal. No respiratory distress. She has wheezes. She has no rales. She exhibits no tenderness.  Abdominal: Bowel sounds are normal. She exhibits no mass. There is no tenderness.  Musculoskeletal: Normal range of motion.  Lymphadenopathy:    She has no cervical adenopathy.  Neurological: She is alert and oriented to person, place, and time. She has normal strength and normal reflexes. No cranial nerve deficit or sensory deficit. She exhibits normal muscle  tone. She displays a negative Romberg sign. Coordination and gait normal.      Results for orders placed in visit on 10/03/12  POCT UA - MICROSCOPIC ONLY      Result Value Range   WBC, Ur, HPF, POC 0-4     RBC, urine, microscopic 0-6     Bacteria, U Microscopic trace     Mucus, UA large     Epithelial cells, urine per micros 0-6     Crystals, Ur, HPF, POC neg     Casts, Ur, LPF, POC neg     Yeast, UA neg    POCT URINALYSIS DIPSTICK      Result Value Range   Color, UA yellow     Clarity, UA clear     Glucose, UA neg     Bilirubin, UA neg     Ketones, UA neg     Spec Grav, UA >=1.030     Blood, UA moderate     pH, UA 6.5     Protein, UA >=300     Urobilinogen, UA 0.2     Nitrite, UA neg     Leukocytes, UA Negative    GLUCOSE, POCT (MANUAL RESULT ENTRY)      Result Value Range   POC Glucose 113 (*) 70 - 99 mg/dl  POCT GLYCOSYLATED HEMOGLOBIN (HGB A1C)      Result Value Range   Hemoglobin A1C 6.1         Assessment & Plan:  Start chlorthalidone 25mg  qd/Eat K RF meds Depomedrol to relieve acute severe allergys Reck 2-4 weeks bmet and bp

## 2012-10-03 NOTE — Patient Instructions (Signed)
Potassium (K) Potassium is an electrolyte that helps regulate the amount of fluid in the body. It also stimulates muscle contraction and maintains a stable acid-base balance. Most of the body's potassium is inside of cells, and only a very small amount is in the blood. Because the amount in the blood is so small, minor changes can have big effects. PREPARATION FOR TEST Testing for potassium requires taking a blood sample taken by needle from a vein in the arm. The skin is cleaned thoroughly before the sample is drawn. There is no other special preparation needed. NORMAL FINDINGS  Adults: 3.5-5 mEq/L (3.5-5 mmol/L).  Premature neonates, cord blood: 5-10.2 mEq/L (5-10.2 mmol/L).  Premature neonates, 48 hours: 3-6 mEq/L (3-6 mmol/L).  Neonates: 3.7-5.9 mEq/L (3.7-5.9 mmol/L).  Neonates, cord blood: 5.6-12 mEq/L (5.6-12 mmol/L).  Infants: 4.1-5.3 mEq/L (4.1-5.3 mmol/L).  Children: 3.4-4.7 mEq/L (3.4-4.7 mmol/L). Ranges for normal findings may vary among different laboratories and hospitals. You should always check with your doctor after having lab work or other tests done to discuss the meaning of your test results and whether your values are considered within normal limits. MEANING OF TEST Your caregiver will go over the test results with you and discuss the importance and meaning of your results, as well as treatment options and the need for additional tests if necessary. OBTAINING THE TEST RESULTS It is your responsibility to obtain your test results. Ask the lab or department performing the test when and how you will get your results. Document Released: 07/11/2004 Document Revised: 08/31/2011 Document Reviewed: 05/20/2008 [facility] Patient Information 2013 [place], [place]. DASH Diet The DASH diet stands for "Dietary Approaches to Stop Hypertension." It is a healthy eating plan that has been shown to reduce high blood pressure (hypertension) in as little as 14 days, while also possibly  providing other significant health benefits. These other health benefits include reducing the risk of breast cancer after menopause and reducing the risk of type 2 diabetes, heart disease, colon cancer, and stroke. Health benefits also include weight loss and slowing kidney failure in patients with chronic kidney disease.  DIET GUIDELINES  Limit salt (sodium). Your diet should contain less than 1500 mg of sodium daily.  Limit refined or processed carbohydrates. Your diet should include mostly whole grains. Desserts and added sugars should be used sparingly.  Include small amounts of heart-healthy fats. These types of fats include nuts, oils, and tub margarine. Limit saturated and trans fats. These fats have been shown to be harmful in the body. CHOOSING FOODS  The following food groups are based on a 2000 calorie diet. See your Registered Dietitian for individual calorie needs. Grains and Grain Products (6 to 8 servings daily)  Eat More Often: Whole-wheat bread, brown rice, whole-grain or wheat pasta, quinoa, popcorn without added fat or salt (air popped).  Eat Less Often: White bread, white pasta, white rice, cornbread. Vegetables (4 to 5 servings daily)  Eat More Often: Fresh, frozen, and canned vegetables. Vegetables may be raw, steamed, roasted, or grilled with a minimal amount of fat.  Eat Less Often/Avoid: Creamed or fried vegetables. Vegetables in a cheese sauce. Fruit (4 to 5 servings daily)  Eat More Often: All fresh, canned (in natural juice), or frozen fruits. Dried fruits without added sugar. One hundred percent fruit juice ( cup [237 mL] daily).  Eat Less Often: Dried fruits with added sugar. Canned fruit in light or heavy syrup. [employer], Fish, and Poultry (2 servings or less daily. One serving is 3 to  4 oz [85-114 g]).  Eat More Often: Ninety percent or leaner ground beef, tenderloin, sirloin. Round cuts of beef, chicken breast, [place] breast. All fish. Grill, bake, or  broil your meat. Nothing should be fried.  Eat Less Often/Avoid: Fatty cuts of meat, [place], or chicken leg, thigh, or wing. Fried cuts of meat or fish. Dairy (2 to 3 servings)  Eat More Often: Low-fat or fat-free milk, low-fat plain or light yogurt, reduced-fat or part-skim cheese.  Eat Less Often/Avoid: Milk (whole, 2%).Whole milk yogurt. Full-fat cheeses. Nuts, Seeds, and Legumes (4 to 5 servings per week)  Eat More Often: All without added salt.  Eat Less Often/Avoid: Salted nuts and seeds, canned beans with added salt. Fats and Sweets (limited)  Eat More Often: Vegetable oils, tub margarines without trans fats, sugar-free gelatin. Mayonnaise and salad dressings.  Eat Less Often/Avoid: Coconut oils, palm oils, butter, stick margarine, cream, half and half, cookies, candy, pie. FOR MORE INFORMATION The Dash Diet Eating Plan: www.dashdiet.org Document Released: 05/28/2011 Document Revised: 08/31/2011 Document Reviewed: 05/28/2011 [facility] Patient Information 2013 [place], [place].

## 2012-10-03 NOTE — Progress Notes (Signed)
  Subjective:    Patient ID: Ashley Villarreal, female    DOB: 27-Dec-1955, 57 y.o.   MRN: [id number]  HPI    Review of Systems  Constitutional: Negative.   HENT: Negative.   Eyes: Negative.   Respiratory: Negative.   Cardiovascular: Negative.   Gastrointestinal: Negative.   Endocrine: Negative.   Genitourinary: Negative.   Musculoskeletal: Negative.   Skin: Negative.   Allergic/Immunologic: Negative.   Neurological: Negative.   Hematological: Negative.   Psychiatric/Behavioral: Negative.        Objective:   Physical Exam        Assessment & Plan:

## 2012-10-06 ENCOUNTER — Telehealth: Payer: Self-pay

## 2012-10-06 NOTE — Telephone Encounter (Signed)
Pt missed a call from here and she doesn't know who called if someone could call her back at [phone]

## 2012-10-06 NOTE — Telephone Encounter (Signed)
I did not call, there is not any indication we called her , will you review labs ? So I can advise on this?

## 2012-10-07 ENCOUNTER — Encounter: Payer: Self-pay | Admitting: Family Medicine

## 2012-10-07 NOTE — Telephone Encounter (Signed)
Pt states that she was placed on a new medication and was supposed to see Dr.Guest one month after, pt was told that he did not have an appt available until July. Pt would like to know if Dr.Guest could let the appt building know she can be scheduled before July. Best# [phone]

## 2012-10-07 NOTE — Telephone Encounter (Signed)
Double book

## 2012-10-10 NOTE — Telephone Encounter (Signed)
Overbook appt made per provider for 5/12 at 9:30, pt informed.

## 2012-10-17 ENCOUNTER — Other Ambulatory Visit: Payer: Self-pay

## 2012-10-17 DIAGNOSIS — Z0000 Encounter for general adult medical examination without abnormal findings: Secondary | ICD-10-CM

## 2012-10-17 DIAGNOSIS — Z7189 Other specified counseling: Secondary | ICD-10-CM

## 2012-10-17 DIAGNOSIS — E119 Type 2 diabetes mellitus without complications: Secondary | ICD-10-CM

## 2012-10-17 DIAGNOSIS — I10 Essential (primary) hypertension: Secondary | ICD-10-CM

## 2012-10-17 MED ORDER — SIMVASTATIN 10 MG PO TABS: 10.0000 mg | ORAL_TABLET | Freq: Every day | ORAL | Status: DC

## 2012-10-17 MED ORDER — LOSARTAN POTASSIUM 100 MG PO TABS: 100.0000 mg | ORAL_TABLET | Freq: Every day | ORAL | Status: DC

## 2012-10-17 MED ORDER — METFORMIN HCL 500 MG PO TABS: 500.0000 mg | ORAL_TABLET | Freq: Two times a day (BID) | ORAL | Status: DC

## 2012-10-17 MED ORDER — NEBIVOLOL HCL 10 MG PO TABS: 10.0000 mg | ORAL_TABLET | Freq: Every day | ORAL | Status: DC

## 2012-10-31 ENCOUNTER — Ambulatory Visit (INDEPENDENT_AMBULATORY_CARE_PROVIDER_SITE_OTHER): Payer: BC Managed Care – PPO | Admitting: Internal Medicine

## 2012-10-31 ENCOUNTER — Encounter: Payer: Self-pay | Admitting: Internal Medicine

## 2012-10-31 VITALS — BP 117/68 | HR 77 | Temp 97.4°F | Resp 16 | Ht 67.0 in | Wt 155.0 lb

## 2012-10-31 DIAGNOSIS — E119 Type 2 diabetes mellitus without complications: Secondary | ICD-10-CM

## 2012-10-31 DIAGNOSIS — Z79899 Other long term (current) drug therapy: Secondary | ICD-10-CM

## 2012-10-31 DIAGNOSIS — I10 Essential (primary) hypertension: Secondary | ICD-10-CM

## 2012-10-31 LAB — BASIC METABOLIC PANEL
BUN: 19 mg/dL (ref 6–23)
CO2: 28 mEq/L (ref 19–32)
Calcium: 9.8 mg/dL (ref 8.4–10.5)
Glucose, Bld: 152 mg/dL — ABNORMAL HIGH (ref 70–99)
Sodium: 138 mEq/L (ref 135–145)

## 2012-10-31 NOTE — Progress Notes (Signed)
Subjective:    Patient ID: Ashley Villarreal, female    DOB: [birth year], 57 y.o.   MRN: [id number]  HPI HTN not to goal and new med started. Needs f/up and bmet BP home record excellent.  Review of Systems     Objective:   Physical Exam  Vitals reviewed. Constitutional: She is oriented to person, place, and time. She appears well-developed and well-nourished.  Eyes: EOM are normal.  Cardiovascular: Normal rate, regular rhythm and normal heart sounds.   Pulmonary/Chest: Effort normal.  Neurological: She is alert and oriented to person, place, and time. She exhibits normal muscle tone. Coordination normal.  Psychiatric: She has a normal mood and affect.     Results for orders placed in visit on 10/03/12  CBC WITH DIFFERENTIAL      Result Value Range   WBC 8.1  4.0 - 10.5 K/uL   RBC 4.80  3.87 - 5.11 MIL/uL   Hemoglobin 13.0  12.0 - 15.0 g/dL   HCT 14.7  82.9 - 56.2 %   MCV 82.9  78.0 - 100.0 fL   MCH 27.1  26.0 - 34.0 pg   MCHC 32.7  30.0 - 36.0 g/dL   RDW 13.0  86.5 - 78.4 %   Platelets 305  150 - 400 K/uL   Neutrophils Relative 59  43 - 77 %   Neutro Abs 4.8  1.7 - 7.7 K/uL   Lymphocytes Relative 27  12 - 46 %   Lymphs Abs 2.2  0.7 - 4.0 K/uL   Monocytes Relative 6  3 - 12 %   Monocytes Absolute 0.5  0.1 - 1.0 K/uL   Eosinophils Relative 7 (*) 0 - 5 %   Eosinophils Absolute 0.6  0.0 - 0.7 K/uL   Basophils Relative 1  0 - 1 %   Basophils Absolute 0.1  0.0 - 0.1 K/uL   Smear Review Criteria for review not met    COMPREHENSIVE METABOLIC PANEL      Result Value Range   Sodium 141  135 - 145 mEq/L   Potassium 4.0  3.5 - 5.3 mEq/L   Chloride 103  96 - 112 mEq/L   CO2 28  19 - 32 mEq/L   Glucose, Bld 102 (*) 70 - 99 mg/dL   BUN 14  6 - 23 mg/dL   Creat 6.96  2.95 - 2.84 mg/dL   Total Bilirubin 0.4  0.3 - 1.2 mg/dL   Alkaline Phosphatase 68  39 - 117 U/L   AST 17  0 - 37 U/L   ALT 17  0 - 35 U/L   Total Protein 6.9  6.0 - 8.3 g/dL   Albumin 4.2  3.5 - 5.2 g/dL   Calcium 9.6  8.4 - 13.2 mg/dL  LIPID PANEL      Result Value Range   Cholesterol 171  0 - 200 mg/dL   Triglycerides 84  <440 mg/dL   HDL 40  >10 mg/dL   Total CHOL/HDL Ratio 4.3     VLDL 17  0 - 40 mg/dL   LDL Cholesterol 272 (*) 0 - 99 mg/dL  TSH      Result Value Range   TSH 0.742  0.350 - 4.500 uIU/mL  POCT UA - MICROSCOPIC ONLY      Result Value Range   WBC, Ur, HPF, POC 0-4     RBC, urine, microscopic 0-6     Bacteria, U Microscopic trace     Mucus, UA large  Epithelial cells, urine per micros 0-6     Crystals, Ur, HPF, POC neg     Casts, Ur, LPF, POC neg     Yeast, UA neg    POCT URINALYSIS DIPSTICK      Result Value Range   Color, UA yellow     Clarity, UA clear     Glucose, UA neg     Bilirubin, UA neg     Ketones, UA neg     Spec Grav, UA >=1.030     Blood, UA moderate     pH, UA 6.5     Protein, UA >=300     Urobilinogen, UA 0.2     Nitrite, UA neg     Leukocytes, UA Negative    GLUCOSE, POCT (MANUAL RESULT ENTRY)      Result Value Range   POC Glucose 113 (*) 70 - 99 mg/dl  IFOBT (OCCULT BLOOD)      Result Value Range   IFOBT Negative    POCT GLYCOSYLATED HEMOGLOBIN (HGB A1C)      Result Value Range   Hemoglobin A1C 6.1          Assessment & Plan:  RF meds 90 day and 3 rfs when pharm calls

## 2012-10-31 NOTE — Patient Instructions (Signed)
Chlorthalidone tablets What is this medicine? CHLORTHALIDONE (klor THAL i done) is a diuretic. It increases the amount of urine passed, which causes the body to lose salt and water. This medicine is used to treat high blood pressure and edema or water retention. This medicine may be used for other purposes; ask your health care provider or pharmacist if you have questions. What should I tell my health care provider before I take this medicine? They need to know if you have any of these conditions: -asthma -diabetes -gout -kidney disease -liver disease -parathyroid disease -systemic lupus erythematosus (SLE) -taking cortisone, digoxin, lithium carbonate, or drugs for diabetes -an unusual or allergic reaction to chlorthalidone, sulfa drugs, other medicines, foods, dyes, or preservatives -pregnant or trying to get pregnant -breast-feeding How should I use this medicine? Take this medicine by mouth with a glass of water. Follow the directions on the prescription label. It is best to take your dose in the morning with food. Take your medicine at regular intervals. Do not take your medicine more often than directed. Do not stop taking except on your doctor's advice. Talk to your pediatrician regarding the use of this medicine in children. Special care may be needed. Overdosage: If you think you have taken too much of this medicine contact a poison control center or emergency room at once. NOTE: This medicine is only for you. Do not share this medicine with others. What if I miss a dose? If you miss a dose, take it as soon as you can. If it is almost time for your next dose, take only that dose. Do not take double or extra doses. What may interact with this medicine? -barbiturate medicines for sleep or seizure control -digoxin -lithium -medicines for diabetes -norepinephrine -other medicines for high blood pressure -some pain medicines -steroid hormones like prednisone, cortisone,  hydrocortisone, corticotropin -tubocurarine This list may not describe all possible interactions. Give your health care provider a list of all the medicines, herbs, non-prescription drugs, or dietary supplements you use. Also tell them if you smoke, drink alcohol, or use illegal drugs. Some items may interact with your medicine. What should I watch for while using this medicine? Visit your doctor or health care professional for regular check ups. Check your blood pressure as directed. Ask your doctor or health care professional what your blood pressure should be and when you should contact him or her. You may need to be on a special diet while taking this medicine. Ask your doctor. You may get drowsy or dizzy. Do not drive, use machinery, or do anything that needs mental alertness until you know how this medicine affects you. Do not stand or sit up quickly, especially if you are an older patient. This reduces the risk of dizzy or fainting spells. Alcohol may interfere with the effect of this medicine. Avoid alcoholic drinks. This medicine may affect your blood sugar level. If you have diabetes, check with your doctor or health care professional before changing the dose of your diabetic medicine. This medicine can make you more sensitive to the sun. Keep out of the sun. If you cannot avoid being in the sun, wear protective clothing and use sunscreen. Do not use sun lamps or tanning beds/booths. What side effects may I notice from receiving this medicine? Side effects that you should report to your doctor or health care professional as soon as possible: -allergic reactions like skin rash, itching or hives, swelling of the face, lips, or tongue -dark urine -dry mouth -excess  thirst -fast, irregular heart rate -fever, chills -muscle pain, cramps, or spasm -nausea, vomiting -redness, blistering, peeling or loosening of the skin, including inside the mouth -tingling, pain or numbness in the hands or  feet -unusually weak or tired -yellowing of the eyes or skin Side effects that usually do not require medical attention (report to your doctor or health care professional if they continue or are bothersome): -diarrhea or constipation -headache -impotence -loss of appetite -stomach upset This list may not describe all possible side effects. Call your doctor for medical advice about side effects. You may report side effects to FDA at 1-800-FDA-1088. Where should I keep my medicine? Keep out of the reach of children. Store at room temperature between 15 and 30 degrees C (59 and 86 degrees F). Keep container tightly closed. Throw away any unused medicine after the expiration date. NOTE: This sheet is a summary. It may not cover all possible information. If you have questions about this medicine, talk to your doctor, pharmacist, or health care provider.  2012, [employer]. (09/13/2007 3:28:48 PM)

## 2013-03-10 ENCOUNTER — Other Ambulatory Visit (HOSPITAL_COMMUNITY): Payer: Self-pay | Admitting: Obstetrics & Gynecology

## 2013-03-10 DIAGNOSIS — Z1231 Encounter for screening mammogram for malignant neoplasm of breast: Secondary | ICD-10-CM

## 2013-04-12 ENCOUNTER — Ambulatory Visit (HOSPITAL_COMMUNITY): Payer: BC Managed Care – PPO

## 2013-04-14 ENCOUNTER — Ambulatory Visit (HOSPITAL_COMMUNITY)
Admission: RE | Admit: 2013-04-14 | Discharge: 2013-04-14 | Disposition: A | Discharge status: Home / Self Care | Payer: BC Managed Care – PPO | Source: Ambulatory Visit | Attending: Obstetrics & Gynecology | Admitting: Obstetrics & Gynecology

## 2013-04-14 DIAGNOSIS — Z1231 Encounter for screening mammogram for malignant neoplasm of breast: Secondary | ICD-10-CM | POA: Insufficient documentation

## 2013-06-24 ENCOUNTER — Ambulatory Visit: Payer: BC Managed Care – PPO

## 2013-06-24 ENCOUNTER — Ambulatory Visit (INDEPENDENT_AMBULATORY_CARE_PROVIDER_SITE_OTHER): Payer: BC Managed Care – PPO | Admitting: Internal Medicine

## 2013-06-24 VITALS — BP 94/46 | HR 96 | Temp 102.6°F | Resp 18 | Ht 67.0 in | Wt 162.0 lb

## 2013-06-24 DIAGNOSIS — J189 Pneumonia, unspecified organism: Secondary | ICD-10-CM

## 2013-06-24 DIAGNOSIS — R509 Fever, unspecified: Secondary | ICD-10-CM

## 2013-06-24 DIAGNOSIS — R059 Cough, unspecified: Secondary | ICD-10-CM

## 2013-06-24 DIAGNOSIS — R05 Cough: Secondary | ICD-10-CM

## 2013-06-24 LAB — POCT CBC
Granulocyte percent: 53.4 %G (ref 37–80)
HCT, POC: 39.3 % (ref 37.7–47.9)
HEMOGLOBIN: 12 g/dL — AB (ref 12.2–16.2)
LYMPH, POC: 2.4 (ref 0.6–3.4)
MCH: 26.7 pg — AB (ref 27–31.2)
MCHC: 30.5 g/dL — AB (ref 31.8–35.4)
MCV: 87.3 fL (ref 80–97)
MID (CBC): 0.6 (ref 0–0.9)
MPV: 10.6 fL (ref 0–99.8)
POC Granulocyte: 3.5 (ref 2–6.9)
POC LYMPH PERCENT: 36.8 %L (ref 10–50)
POC MID %: 9.8 %M (ref 0–12)
Platelet Count, POC: 257 10*3/uL (ref 142–424)
RBC: 4.5 M/uL (ref 4.04–5.48)
RDW, POC: 14.8 %
WBC: 6.6 10*3/uL (ref 4.6–10.2)

## 2013-06-24 LAB — POCT INFLUENZA A/B
Influenza A, POC: POSITIVE
Influenza B, POC: NEGATIVE

## 2013-06-24 MED ORDER — HYDROCODONE-HOMATROPINE 5-1.5 MG/5ML PO SYRP: 5.0000 mL | ORAL_SOLUTION | Freq: Four times a day (QID) | ORAL | Status: DC | PRN

## 2013-06-24 MED ORDER — LEVOFLOXACIN 750 MG PO TABS: 750.0000 mg | ORAL_TABLET | Freq: Every day | ORAL | Status: DC

## 2013-06-24 NOTE — Progress Notes (Signed)
Subjective:    Patient ID: Ashley Villarreal, female    DOB: [birth year], 58 y.o.   MRN: [id number]  HPI Cough,fever,HAs 3 days No st,nasal congestion No N/V Has been requiring Xopenex/history of asthma as detailed below No night sweats or weight loss Cough interferes with sleep  Patient Active Problem List   Diagnosis Date Noted  . HTN (hypertension) 08/10/2011  . DM (diabetes mellitus screen) 08/10/2011  . Hyperlipemia 08/10/2011  . Asthma 08/10/2011  . Allergy history unknown 08/10/2011  Current outpatient prescriptions:atorvastatin (LIPITOR) 10 MG tablet, Take 1 tablet (10 mg total) by mouth daily.   chlorthalidone (HYGROTON) 25 MG tablet, Take 1 tablet (25 mg total) by mouth daily. EPINEPHrine (EPIPEN) 0.3 mg/0.3 mL DEVI, Inject 0.3 mLs (0.3 mg total) into the muscle once., Disp: 2 Device, Rfl: 3 ipratropium (ATROVENT) 0.03 % nasal spray, Place 2 sprays into the nose 3 (three) times daily.  levalbuterol (XOPENEX HFA) 45 MCG/ACT inhaler, Inhale 1-2 puffs into the lungs every 4 (four) hours as needed. losartan (COZAAR) 100 MG tablet, Take 1 tablet (100 mg total) by mouth daily., Disp: 90 tablet, Rfl: 3 metFORMIN (GLUCOPHAGE) 500 MG tablet, Take 1 tablet (500 mg total) by mouth 2 (two) times daily with a meal.,   nebivolol (BYSTOLIC) 10 MG tablet, Take 1 tablet (10 mg total) by mouth daily., Disp: 90 tablet, Rfl: 3;  omeprazole-sodium bicarbonate (ZEGERID) 40-1100 MG per capsule simvastatin (ZOCOR) 10 MG tablet, Take 1 tablet (10 mg total) by mouth at bedtime., SYMBICORT 160-4.5 MCG/ACT inhaler, , Disp: , Rfl: ;   XOLAIR 150 MG injection, , Disp: , Rfl: ;   Her asthma has been stable for many months since starting Xolair   Review of Systems  Constitutional: Positive for activity change, appetite change and fatigue. Negative for chills and unexpected weight change.  HENT: Negative for sore throat and trouble swallowing.   Cardiovascular: Negative for chest pain, palpitations and  leg swelling.  Gastrointestinal: Negative for nausea and abdominal pain.  Skin: Negative for rash.       Objective:   Physical Exam BP 94/46  Pulse 96  Temp(Src) 102.6 F (39.2 C) (Oral)  Resp 18  Ht 5\' 7"  (1.702 m)  Wt 162 lb (73.483 kg)  BMI 25.37 kg/m2  SpO2 97% No acute distress Conjunctiva clear TMs clear/nares clear Oropharynx clear No nodes Chest with rales on the right posteriorly and wheezing bilaterally with forced expiration    Results for orders placed in visit on 06/24/13  POCT CBC      Result Value Range   WBC 6.6  4.6 - 10.2 K/uL   Lymph, poc 2.4  0.6 - 3.4   POC LYMPH PERCENT 36.8  10 - 50 %L   MID (cbc) 0.6  0 - 0.9   POC MID % 9.8  0 - 12 %M   POC Granulocyte 3.5  2 - 6.9   Granulocyte percent 53.4  37 - 80 %G   RBC 4.50  4.04 - 5.48 M/uL   Hemoglobin 12.0 (*) 12.2 - 16.2 g/dL   HCT, POC 39.3  37.7 - 47.9 %   MCV 87.3  80 - 97 fL   MCH, POC 26.7 (*) 27 - 31.2 pg   MCHC 30.5 (*) 31.8 - 35.4 g/dL   RDW, POC 14.8     Platelet Count, POC 257  142 - 424 K/uL   MPV 10.6  0 - 99.8 fL   [facility] reading (PRIMARY) by  Dr. Johnney Killian  infiltr   Assessment & Plan:  Fever, unspecified - CAP (community acquired pneumonia) - -underlying asthma  Meds ordered this encounter  Medications  . levofloxacin (LEVAQUIN) 750 MG tablet    Sig: Take 1 tablet (750 mg total) by mouth daily.    Dispense:  7 tablet    Refill:  0  . HYDROcodone-homatropine (HYCODAN) 5-1.5 MG/5ML syrup    Sig: Take 5 mLs by mouth every 6 (six) hours as needed for cough.    Dispense:  120 mL    Refill:  0

## 2013-06-27 ENCOUNTER — Telehealth: Payer: Self-pay

## 2013-06-27 NOTE — Telephone Encounter (Signed)
Patient calling dr Laney Pastor to let him know she is not feeling any better please call patient at [phone]

## 2013-06-27 NOTE — Telephone Encounter (Signed)
Any suggestions Dr Laney Pastor or questions you would like me to ask pt.

## 2013-06-27 NOTE — Telephone Encounter (Signed)
Very likely the flu if not responding to levaquin-- If having more trouble breathing or worse in any way, we should recheck

## 2013-06-28 NOTE — Telephone Encounter (Signed)
Called her to advise. She does feel better today.

## 2013-09-30 ENCOUNTER — Ambulatory Visit (INDEPENDENT_AMBULATORY_CARE_PROVIDER_SITE_OTHER): Payer: BC Managed Care – PPO | Admitting: Family Medicine

## 2013-09-30 VITALS — BP 122/70 | HR 77 | Temp 98.4°F | Resp 16 | Ht 67.5 in | Wt 157.0 lb

## 2013-09-30 DIAGNOSIS — J189 Pneumonia, unspecified organism: Secondary | ICD-10-CM

## 2013-09-30 DIAGNOSIS — J45901 Unspecified asthma with (acute) exacerbation: Secondary | ICD-10-CM

## 2013-09-30 DIAGNOSIS — R059 Cough, unspecified: Secondary | ICD-10-CM

## 2013-09-30 DIAGNOSIS — R05 Cough: Secondary | ICD-10-CM

## 2013-09-30 DIAGNOSIS — J209 Acute bronchitis, unspecified: Secondary | ICD-10-CM

## 2013-09-30 MED ORDER — AZITHROMYCIN 250 MG PO TABS: ORAL_TABLET | ORAL | Status: DC

## 2013-09-30 MED ORDER — HYDROCODONE-HOMATROPINE 5-1.5 MG/5ML PO SYRP: 5.0000 mL | ORAL_SOLUTION | Freq: Four times a day (QID) | ORAL | Status: DC | PRN

## 2013-09-30 MED ORDER — BENZONATATE 100 MG PO CAPS: 100.0000 mg | ORAL_CAPSULE | Freq: Three times a day (TID) | ORAL | Status: DC | PRN

## 2013-09-30 NOTE — Progress Notes (Signed)
Subjective

## 2013-09-30 NOTE — Patient Instructions (Signed)
Drink plenty of fluids and get enough rest  Take the azithromycin 2 tablets initially, then one daily for 4 days for antibiotic  Take the Tessalon cough pills (benzonatate) one or 2 pills 3 times daily as needed for cough  Use the cough syrup at nighttime for cough or rest  Return if worse

## 2013-10-13 ENCOUNTER — Ambulatory Visit (INDEPENDENT_AMBULATORY_CARE_PROVIDER_SITE_OTHER): Payer: BC Managed Care – PPO | Admitting: Internal Medicine

## 2013-10-13 VITALS — BP 110/58 | HR 71 | Temp 98.5°F | Resp 16 | Ht 66.5 in | Wt 156.6 lb

## 2013-10-13 DIAGNOSIS — Z79899 Other long term (current) drug therapy: Secondary | ICD-10-CM

## 2013-10-13 DIAGNOSIS — Z0000 Encounter for general adult medical examination without abnormal findings: Secondary | ICD-10-CM

## 2013-10-13 DIAGNOSIS — E119 Type 2 diabetes mellitus without complications: Secondary | ICD-10-CM

## 2013-10-13 DIAGNOSIS — I10 Essential (primary) hypertension: Secondary | ICD-10-CM

## 2013-10-13 DIAGNOSIS — J45909 Unspecified asthma, uncomplicated: Secondary | ICD-10-CM

## 2013-10-13 LAB — POCT URINALYSIS DIPSTICK
BILIRUBIN UA: NEGATIVE
GLUCOSE UA: NEGATIVE
Ketones, UA: NEGATIVE
LEUKOCYTES UA: NEGATIVE
NITRITE UA: NEGATIVE
Protein, UA: 100
Spec Grav, UA: 1.015
UROBILINOGEN UA: 0.2
pH, UA: 8

## 2013-10-13 LAB — POCT CBC
Granulocyte percent: 69.7 %G (ref 37–80)
HCT, POC: 36.8 % — AB (ref 37.7–47.9)
Hemoglobin: 11.4 g/dL — AB (ref 12.2–16.2)
LYMPH, POC: 2.2 (ref 0.6–3.4)
MCH, POC: 27 pg (ref 27–31.2)
MCHC: 31 g/dL — AB (ref 31.8–35.4)
MCV: 87 fL (ref 80–97)
MID (CBC): 0.6 (ref 0–0.9)
MPV: 10.6 fL (ref 0–99.8)
PLATELET COUNT, POC: 298 10*3/uL (ref 142–424)
POC Granulocyte: 6.5 (ref 2–6.9)
POC LYMPH PERCENT: 23.7 %L (ref 10–50)
POC MID %: 6.6 % (ref 0–12)
RBC: 4.23 M/uL (ref 4.04–5.48)
RDW, POC: 14.3 %
WBC: 9.3 10*3/uL (ref 4.6–10.2)

## 2013-10-13 LAB — GLUCOSE, POCT (MANUAL RESULT ENTRY): POC GLUCOSE: 123 mg/dL — AB (ref 70–99)

## 2013-10-13 LAB — POCT UA - MICROSCOPIC ONLY
Bacteria, U Microscopic: NEGATIVE
CASTS, UR, LPF, POC: NEGATIVE
Crystals, Ur, HPF, POC: NEGATIVE
Mucus, UA: NEGATIVE
YEAST UA: NEGATIVE

## 2013-10-13 LAB — POCT GLYCOSYLATED HEMOGLOBIN (HGB A1C): HEMOGLOBIN A1C: 7

## 2013-10-13 NOTE — Patient Instructions (Signed)
DASH Diet  The DASH diet stands for "Dietary Approaches to Stop Hypertension." It is a healthy eating plan that has been shown to reduce high blood pressure (hypertension) in as little as 14 days, while also possibly providing other significant health benefits. These other health benefits include reducing the risk of breast cancer after menopause and reducing the risk of type 2 diabetes, heart disease, colon cancer, and stroke. Health benefits also include weight loss and slowing kidney failure in patients with chronic kidney disease.   DIET GUIDELINES  · Limit salt (sodium). Your diet should contain less than 1500 mg of sodium daily.  · Limit refined or processed carbohydrates. Your diet should include mostly whole grains. Desserts and added sugars should be used sparingly.  · Include small amounts of heart-healthy fats. These types of fats include nuts, oils, and tub margarine. Limit saturated and trans fats. These fats have been shown to be harmful in the body.  CHOOSING FOODS   The following food groups are based on a 2000 calorie diet. See your Registered Dietitian for individual calorie needs.  Grains and Grain Products (6 to 8 servings daily)  · Eat More Often: Whole-wheat bread, brown rice, whole-grain or wheat pasta, quinoa, popcorn without added fat or salt (air popped).  · Eat Less Often: White bread, white pasta, white rice, cornbread.  Vegetables (4 to 5 servings daily)  · Eat More Often: Fresh, frozen, and canned vegetables. Vegetables may be raw, steamed, roasted, or grilled with a minimal amount of fat.  · Eat Less Often/Avoid: Creamed or fried vegetables. Vegetables in a cheese sauce.  Fruit (4 to 5 servings daily)  · Eat More Often: All fresh, canned (in natural juice), or frozen fruits. Dried fruits without added sugar. One hundred percent fruit juice (½ cup [237 mL] daily).  · Eat Less Often: Dried fruits with added sugar. Canned fruit in light or heavy syrup.  Lean Meats, Fish, and Poultry (2  servings or less daily. One serving is 3 to 4 oz [85-114 g]).  · Eat More Often: Ninety percent or leaner ground beef, tenderloin, sirloin. Round cuts of beef, chicken breast, turkey breast. All fish. Grill, bake, or broil your meat. Nothing should be fried.  · Eat Less Often/Avoid: Fatty cuts of meat, turkey, or chicken leg, thigh, or wing. Fried cuts of meat or fish.  Dairy (2 to 3 servings)  · Eat More Often: Low-fat or fat-free milk, low-fat plain or light yogurt, reduced-fat or part-skim cheese.  · Eat Less Often/Avoid: Milk (whole, 2%). Whole milk yogurt. Full-fat cheeses.  Nuts, Seeds, and Legumes (4 to 5 servings per week)  · Eat More Often: All without added salt.  · Eat Less Often/Avoid: Salted nuts and seeds, canned beans with added salt.  Fats and Sweets (limited)  · Eat More Often: Vegetable oils, tub margarines without trans fats, sugar-free gelatin. Mayonnaise and salad dressings.  · Eat Less Often/Avoid: Coconut oils, palm oils, butter, stick margarine, cream, half and half, cookies, candy, pie.  FOR MORE INFORMATION  The Dash Diet Eating Plan: www.dashdiet.org  Document Released: 05/28/2011 Document Revised: 08/31/2011 Document Reviewed: 05/28/2011  ExitCare® Patient Information ©2014 [employer].

## 2013-10-13 NOTE — Progress Notes (Signed)
Subjective:    Patient ID: Ashley Villarreal, female    DOB: 1955/11/02, 58 y.o.   MRN: [id number]  HPI Ashley Villarreal very well for her. Asthma/allergy controlled better than ever by Dr. Neldon Mc. HTN well controlled T2D controlled Meds are tolerated.   Review of Systems  Constitutional: Negative.   HENT: Negative.   Eyes: Negative.   Respiratory: Negative.   Cardiovascular: Negative.   Gastrointestinal: Negative.   Endocrine: Negative.   Genitourinary: Negative.   Musculoskeletal: Negative.   Skin: Negative.   Allergic/Immunologic: Positive for environmental allergies.  Neurological: Negative.   Psychiatric/Behavioral: Negative.        Objective:   Physical Exam  Constitutional: She is oriented to person, place, and time. She appears well-developed and well-nourished.  HENT:  Head: Normocephalic.  Right Ear: External ear normal.  Left Ear: External ear normal.  Mouth/Throat: Oropharynx is clear and moist.  Eyes: Conjunctivae and EOM are normal. Pupils are equal, round, and reactive to light. No scleral icterus.  Neck: Normal range of motion. Neck supple. No tracheal deviation present. No thyromegaly present.  Cardiovascular: Normal rate, regular rhythm, normal heart sounds and intact distal pulses.   Pulmonary/Chest: No accessory muscle usage. Tachypnea noted. She has no decreased breath sounds. She has wheezes. She has no rhonchi. She exhibits no mass. Right breast exhibits no inverted nipple, no mass, no nipple discharge, no skin change and no tenderness. Left breast exhibits no inverted nipple, no mass, no nipple discharge, no skin change and no tenderness. Breasts are symmetrical.  Abdominal: Soft. Bowel sounds are normal.  Musculoskeletal: Normal range of motion.  Lymphadenopathy:    She has no cervical adenopathy.  Neurological: She is alert and oriented to person, place, and time. She has normal reflexes. No cranial nerve deficit. She exhibits normal muscle tone.  Coordination normal.  Skin: Skin is warm and dry.  Psychiatric: She has a normal mood and affect. Her behavior is normal. Judgment normal.     Results for orders placed in visit on 10/13/13  POCT UA - MICROSCOPIC ONLY      Result Value Ref Range   WBC, Ur, HPF, POC 0-1     RBC, urine, microscopic 0-2     Bacteria, U Microscopic negative     Mucus, UA negative     Epithelial cells, urine per micros 0-1     Crystals, Ur, HPF, POC negative     Casts, Ur, LPF, POC negative     Yeast, UA negative    POCT URINALYSIS DIPSTICK      Result Value Ref Range   Color, UA yellow     Clarity, UA clear     Glucose, UA negative     Bilirubin, UA negative     Ketones, UA negative     Spec Grav, UA 1.015     Blood, UA trace-lysed     pH, UA 8.0     Protein, UA 100     Urobilinogen, UA 0.2     Nitrite, UA negative     Leukocytes, UA Negative     Results for orders placed in visit on 10/13/13  POCT CBC      Result Value Ref Range   WBC 9.3  4.6 - 10.2 K/uL   Lymph, poc 2.2  0.6 - 3.4   POC LYMPH PERCENT 23.7  10 - 50 %L   MID (cbc) 0.6  0 - 0.9   POC MID % 6.6  0 - 12 %M  POC Granulocyte 6.5  2 - 6.9   Granulocyte percent 69.7  37 - 80 %G   RBC 4.23  4.04 - 5.48 M/uL   Hemoglobin 11.4 (*) 12.2 - 16.2 g/dL   HCT, POC 36.8 (*) 37.7 - 47.9 %   MCV 87.0  80 - 97 fL   MCH, POC 27.0  27 - 31.2 pg   MCHC 31.0 (*) 31.8 - 35.4 g/dL   RDW, POC 14.3     Platelet Count, POC 298  142 - 424 K/uL   MPV 10.6  0 - 99.8 fL  POCT UA - MICROSCOPIC ONLY      Result Value Ref Range   WBC, Ur, HPF, POC 0-1     RBC, urine, microscopic 0-2     Bacteria, U Microscopic negative     Mucus, UA negative     Epithelial cells, urine per micros 0-1     Crystals, Ur, HPF, POC negative     Casts, Ur, LPF, POC negative     Yeast, UA negative    POCT URINALYSIS DIPSTICK      Result Value Ref Range   Color, UA yellow     Clarity, UA clear     Glucose, UA negative     Bilirubin, UA negative     Ketones, UA  negative     Spec Grav, UA 1.015     Blood, UA trace-lysed     pH, UA 8.0     Protein, UA 100     Urobilinogen, UA 0.2     Nitrite, UA negative     Leukocytes, UA Negative    GLUCOSE, POCT (MANUAL RESULT ENTRY)      Result Value Ref Range   POC Glucose 123 (*) 70 - 99 mg/dl  POCT GLYCOSYLATED HEMOGLOBIN (HGB A1C)      Result Value Ref Range   Hemoglobin A1C 7.0          Assessment & Plan:  All controlled htn,dm,asthma,allergy RF all meds 1 year Weight is controlled

## 2013-10-14 LAB — COMPREHENSIVE METABOLIC PANEL
ALK PHOS: 67 U/L (ref 39–117)
ALT: 16 U/L (ref 0–35)
AST: 16 U/L (ref 0–37)
Albumin: 4.1 g/dL (ref 3.5–5.2)
BUN: 14 mg/dL (ref 6–23)
CO2: 29 mEq/L (ref 19–32)
CREATININE: 1.23 mg/dL — AB (ref 0.50–1.10)
Calcium: 9.6 mg/dL (ref 8.4–10.5)
Chloride: 100 mEq/L (ref 96–112)
Glucose, Bld: 113 mg/dL — ABNORMAL HIGH (ref 70–99)
Potassium: 4.2 mEq/L (ref 3.5–5.3)
SODIUM: 142 meq/L (ref 135–145)
TOTAL PROTEIN: 6.9 g/dL (ref 6.0–8.3)
Total Bilirubin: 0.3 mg/dL (ref 0.2–1.2)

## 2013-10-14 LAB — LIPID PANEL
CHOLESTEROL: 214 mg/dL — AB (ref 0–200)
HDL: 42 mg/dL (ref >39)
LDL CALC: 142 mg/dL — AB (ref 0–99)
Total CHOL/HDL Ratio: 5.1 Ratio
Triglycerides: 149 mg/dL (ref <150)
VLDL: 30 mg/dL (ref 0–40)

## 2013-10-14 LAB — TSH: TSH: 0.633 u[IU]/mL (ref 0.350–4.500)

## 2013-10-19 ENCOUNTER — Encounter: Payer: Self-pay | Admitting: *Deleted

## 2013-10-23 ENCOUNTER — Other Ambulatory Visit: Payer: Self-pay | Admitting: Internal Medicine

## 2014-01-14 ENCOUNTER — Other Ambulatory Visit: Payer: Self-pay | Admitting: Internal Medicine

## 2014-04-16 ENCOUNTER — Other Ambulatory Visit (HOSPITAL_COMMUNITY): Payer: Self-pay | Admitting: Obstetrics & Gynecology

## 2014-04-16 DIAGNOSIS — Z1231 Encounter for screening mammogram for malignant neoplasm of breast: Secondary | ICD-10-CM

## 2014-04-20 ENCOUNTER — Ambulatory Visit (HOSPITAL_COMMUNITY)
Admission: RE | Admit: 2014-04-20 | Discharge: 2014-04-20 | Disposition: A | Discharge status: Home / Self Care | Payer: BC Managed Care – PPO | Source: Ambulatory Visit | Attending: Obstetrics & Gynecology | Admitting: Obstetrics & Gynecology

## 2014-04-20 DIAGNOSIS — Z1231 Encounter for screening mammogram for malignant neoplasm of breast: Secondary | ICD-10-CM | POA: Insufficient documentation

## 2014-04-28 ENCOUNTER — Ambulatory Visit (INDEPENDENT_AMBULATORY_CARE_PROVIDER_SITE_OTHER): Payer: BC Managed Care – PPO | Admitting: Family Medicine

## 2014-04-28 VITALS — BP 128/70 | HR 93 | Temp 98.9°F | Resp 16 | Ht 67.5 in | Wt 163.6 lb

## 2014-04-28 DIAGNOSIS — J0121 Acute recurrent ethmoidal sinusitis: Secondary | ICD-10-CM

## 2014-04-28 MED ORDER — AZITHROMYCIN 250 MG PO TABS
ORAL_TABLET | ORAL | Status: DC
Start: 2014-04-28 — End: 2014-06-28

## 2014-04-28 MED ORDER — GUAIFENESIN ER 1200 MG PO TB12: 1.0000 | ORAL_TABLET | Freq: Two times a day (BID) | ORAL | Status: DC | PRN

## 2014-04-28 NOTE — Progress Notes (Signed)
Subjective:  This chart was scribed for Delman Cheadle, MD by Dellis Filbert, ED Scribe at [facility].The patient was seen in exam room 11 and the patient's care was started at 10:00 AM.     Chief Complaint  Patient presents with  . Cough    productive/yellow x 4 days  . Sore Throat  . Generalized Body Aches   Patient ID: Ashley Villarreal, female    DOB: [birth year], 59 y.o.   MRN: [id number]  HPI HPI Comments: Ashley Villarreal is a 58 y.o. female with a history of DM, asthma, and COPD who presents to [facility] complaining of a productive cough and generalized body aches which began 5 days ago, and a sore throat that began 3 days ago. Her cough is producing a yellow sputum. She has sinus pressure, rhinorrhea, and trouble sleeping as associated symptoms. Pt states her left ear felt wet but this complaint has resolved. Pt is not taking over the counter medication but she has taken 2 prednisone (10 mg) together on Tuesday. She denies fever, chills, wheezing, an asthma flare up, and trouble swallowing.  Pt has asthma and uses symbicort  two puffs once a day and xopenex as needed but she has not needed this recently. Pt has Atrovent as needed but has not been using for this current complaint.  Pt takes qnasl regularly.  Pt does not have a pulmonologist. Her allergy specialist is Dr. Roswell Nickel   Pt has gotten her flu shot one month ago on 10/13.  Past Medical History  Diagnosis Date  . Allergy   . Diabetes mellitus without complication   . Asthma   . Hypertension   . COPD (chronic obstructive pulmonary disease)    Current Outpatient Prescriptions on File Prior to Visit  Medication Sig Dispense Refill  . BYSTOLIC 10 MG tablet TAKE 1 TABLET (10 MG TOTAL) BY MOUTH DAILY. 90 tablet 3  . chlorthalidone (HYGROTON) 25 MG tablet TAKE 1 TABLET BY MOUTH EVERY DAY 90 tablet 3  . EPINEPHrine (EPIPEN) 0.3 mg/0.3 mL DEVI Inject 0.3 mLs (0.3 mg total) into the muscle once. 2 Device 3  .  ipratropium (ATROVENT) 0.03 % nasal spray Place 2 sprays into the nose 3 (three) times daily. 30 mL 0  . levalbuterol (XOPENEX HFA) 45 MCG/ACT inhaler Inhale 1-2 puffs into the lungs every 4 (four) hours as needed.    Marland Kitchen losartan (COZAAR) 100 MG tablet TAKE 1 TABLET (100 MG TOTAL) BY MOUTH DAILY. 90 tablet 3  . metFORMIN (GLUCOPHAGE) 500 MG tablet TAKE 1 TABLET (500 MG TOTAL) BY MOUTH 2 (TWO) TIMES DAILY WITH A MEAL. 180 tablet 3  . omeprazole-sodium bicarbonate (ZEGERID) 40-1100 MG per capsule     . ONE TOUCH ULTRA TEST test strip     . SYMBICORT 160-4.5 MCG/ACT inhaler     . XOLAIR 150 MG injection     . atorvastatin (LIPITOR) 10 MG tablet Take 1 tablet (10 mg total) by mouth daily. NEEDS OFFICE VISIT/LABS FOR ADD'L REFILLS 30 tablet 0   No current facility-administered medications on file prior to visit.   Allergies  Allergen Reactions  . Claritin [Loratadine]     dizziness  . Other     Magic mouthwash     Review of Systems  Constitutional: Negative for fever and chills.  HENT: Positive for rhinorrhea, sinus pressure and sore throat. Negative for trouble swallowing.   Respiratory: Positive for cough. Negative for wheezing.   Musculoskeletal: Positive for  myalgias.  Psychiatric/Behavioral: Positive for sleep disturbance.       Objective:   Physical Exam  Constitutional: She is oriented to person, place, and time. She appears well-developed and well-nourished. No distress.  HENT:  Head: Normocephalic and atraumatic.  Right Ear: Tympanic membrane, external ear and ear canal normal.  Left Ear: External ear and ear canal normal. Tympanic membrane is retracted.  Nose: Nose normal. No mucosal edema or rhinorrhea.  Mouth/Throat: Posterior oropharyngeal erythema present. No oropharyngeal exudate.     Eyes: EOM are normal.  Neck: Normal range of motion.  Cardiovascular: Normal rate and regular rhythm.  Exam reveals no gallop and no friction rub.   No murmur  heard. Pulmonary/Chest: Effort normal and breath sounds normal.  Lymphadenopathy:    She has no cervical adenopathy.  Neurological: She is alert and oriented to person, place, and time.  Skin: Skin is warm and dry.  Psychiatric: She has a normal mood and affect. Her behavior is normal.  Nursing note and vitals reviewed. BP 128/70 mmHg  Pulse 93  Temp(Src) 98.9 F (37.2 C) (Oral)  Resp 16  Ht 5' 7.5" (1.715 m)  Wt 163 lb 9.6 oz (74.208 kg)  BMI 25.23 kg/m2  SpO2 99%     Assessment & Plan:   Acute recurrent ethmoidal sinusitis  Meds ordered this encounter  Medications  . simvastatin (ZOCOR) 10 MG tablet    Sig: Take 10 mg by mouth daily.  Marland Kitchen azithromycin (ZITHROMAX) 250 MG tablet    Sig: Take 2 tabs PO x 1 dose, then 1 tab PO QD x 4 days    Dispense:  6 tablet    Refill:  0  . Guaifenesin (MUCINEX MAXIMUM STRENGTH) 1200 MG TB12    Sig: Take 1 tablet (1,200 mg total) by mouth every 12 (twelve) hours as needed.    Dispense:  14 tablet    Refill:  1    I personally performed the services described in this documentation, which was scribed in my presence. The recorded information has been reviewed and considered, and addended by me as needed.  Delman Cheadle, MD MPH

## 2014-04-28 NOTE — Patient Instructions (Signed)
Upper Respiratory Infection, Adult An upper respiratory infection (URI) is also sometimes known as the common cold. The upper respiratory tract includes the nose, sinuses, throat, trachea, and bronchi. Bronchi are the airways leading to the lungs. Most people improve within 1 week, but symptoms can last up to 2 weeks. A residual cough may last even longer.  CAUSES Many different viruses can infect the tissues lining the upper respiratory tract. The tissues become irritated and inflamed and often become very moist. Mucus production is also common. A cold is contagious. You can easily spread the virus to others by oral contact. This includes kissing, sharing a glass, coughing, or sneezing. Touching your mouth or nose and then touching a surface, which is then touched by another person, can also spread the virus. SYMPTOMS  Symptoms typically develop 1 to 3 days after you come in contact with a cold virus. Symptoms vary from person to person. They may include:  Runny nose.  Sneezing.  Nasal congestion.  Sinus irritation.  Sore throat.  Loss of voice (laryngitis).  Cough.  Fatigue.  Muscle aches.  Loss of appetite.  Headache.  Low-grade fever. DIAGNOSIS  You might diagnose your own cold based on familiar symptoms, since most people get a cold 2 to 3 times a year. Your caregiver can confirm this based on your exam. Most importantly, your caregiver can check that your symptoms are not due to another disease such as strep throat, sinusitis, pneumonia, asthma, or epiglottitis. Blood tests, throat tests, and X-rays are not necessary to diagnose a common cold, but they may sometimes be helpful in excluding other more serious diseases. Your caregiver will decide if any further tests are required. RISKS AND COMPLICATIONS  You may be at risk for a more severe case of the common cold if you smoke cigarettes, have chronic heart disease (such as heart failure) or lung disease (such as asthma), or if  you have a weakened immune system. The very young and very old are also at risk for more serious infections. Bacterial sinusitis, middle ear infections, and bacterial pneumonia can complicate the common cold. The common cold can worsen asthma and chronic obstructive pulmonary disease (COPD). Sometimes, these complications can require emergency medical care and may be life-threatening. PREVENTION  The best way to protect against getting a cold is to practice good hygiene. Avoid oral or hand contact with people with cold symptoms. Wash your hands often if contact occurs. There is no clear evidence that vitamin C, vitamin E, echinacea, or exercise reduces the chance of developing a cold. However, it is always recommended to get plenty of rest and practice good nutrition. TREATMENT  Treatment is directed at relieving symptoms. There is no cure. Antibiotics are not effective, because the infection is caused by a virus, not by bacteria. Treatment may include:  Increased fluid intake. Sports drinks offer valuable electrolytes, sugars, and fluids.  Breathing heated mist or steam (vaporizer or shower).  Eating chicken soup or other clear broths, and maintaining good nutrition.  Getting plenty of rest.  Using gargles or lozenges for comfort.  Controlling fevers with ibuprofen or acetaminophen as directed by your caregiver.  Increasing usage of your inhaler if you have asthma. Zinc gel and zinc lozenges, taken in the first 24 hours of the common cold, can shorten the duration and lessen the severity of symptoms. Pain medicines may help with fever, muscle aches, and throat pain. A variety of non-prescription medicines are available to treat congestion and runny nose. Your caregiver   can make recommendations and may suggest nasal or lung inhalers for other symptoms.  HOME CARE INSTRUCTIONS   Only take over-the-counter or prescription medicines for pain, discomfort, or fever as directed by your  caregiver.  Use a warm mist humidifier or inhale steam from a shower to increase air moisture. This may keep secretions moist and make it easier to breathe.  Drink enough water and fluids to keep your urine clear or pale yellow.  Rest as needed.  Return to work when your temperature has returned to normal or as your caregiver advises. You may need to stay home longer to avoid infecting others. You can also use a face mask and careful hand washing to prevent spread of the virus. SEEK MEDICAL CARE IF:   After the first few days, you feel you are getting worse rather than better.  You need your caregiver's advice about medicines to control symptoms.  You develop chills, worsening shortness of breath, or brown or red sputum. These may be signs of pneumonia.  You develop yellow or brown nasal discharge or pain in the face, especially when you bend forward. These may be signs of sinusitis.  You develop a fever, swollen neck glands, pain with swallowing, or white areas in the back of your throat. These may be signs of strep throat. SEEK IMMEDIATE MEDICAL CARE IF:   You have a fever.  You develop severe or persistent headache, ear pain, sinus pain, or chest pain.  You develop wheezing, a prolonged cough, cough up blood, or have a change in your usual mucus (if you have chronic lung disease).  You develop sore muscles or a stiff neck. Document Released: 12/02/2000 Document Revised: 08/31/2011 Document Reviewed: 09/13/2013 Exit[employer] Patient Information 2015 [employer]. This information is not intended to replace advice given to you by your health care provider. Make sure you discuss any questions you have with your health care provider.  

## 2014-06-28 ENCOUNTER — Ambulatory Visit (INDEPENDENT_AMBULATORY_CARE_PROVIDER_SITE_OTHER): Payer: 59 | Admitting: Family Medicine

## 2014-06-28 VITALS — BP 110/70 | HR 81 | Temp 98.2°F | Resp 16 | Ht 67.0 in | Wt 161.0 lb

## 2014-06-28 DIAGNOSIS — H6591 Unspecified nonsuppurative otitis media, right ear: Secondary | ICD-10-CM

## 2014-06-28 DIAGNOSIS — J069 Acute upper respiratory infection, unspecified: Secondary | ICD-10-CM

## 2014-06-28 MED ORDER — AMOXICILLIN 875 MG PO TABS: 875.0000 mg | ORAL_TABLET | Freq: Two times a day (BID) | ORAL | Status: DC

## 2014-06-28 NOTE — Progress Notes (Signed)
Subjective:  This chart was scribed for Ashley Ray, MD by Jeanell Sparrow, ED Scribe. This patient was seen in room 12 and the patient's care was started at 11:11 AM.  Patient ID: Ashley Villarreal, female    DOB: 09/15/1955, 59 y.o.   MRN: [id number]  HPI HPI Comments: Ashley Villarreal is a 59 y.o. female who presents to the [facility] complaining of a sore throat.  She reports that she started off with a sore throat two days ago. She states that it later on progressed to congestion, and generalized myalgias. She reports that she took no OTC treatment PTA. She states that she has sick contacts at work. She reports that she had her flu shot this year. She denies any fever.   Patient Active Problem List   Diagnosis Date Noted  . HTN (hypertension) 08/10/2011  . DM (diabetes mellitus screen) 08/10/2011  . Hyperlipemia 08/10/2011  . Asthma with acute exacerbation 08/10/2011  . Allergy history unknown 08/10/2011   Past Medical History  Diagnosis Date  . Allergy   . Diabetes mellitus without complication   . Asthma   . Hypertension   . COPD (chronic obstructive pulmonary disease)    Past Surgical History  Procedure Laterality Date  . Tonsillectomy  1975   Allergies  Allergen Reactions  . Claritin [Loratadine]     dizziness  . Other     Magic mouthwash   Prior to Admission medications   Medication Sig Start Date End Date Taking? Authorizing Provider  BYSTOLIC 10 MG tablet TAKE 1 TABLET (10 MG TOTAL) BY MOUTH DAILY. 10/23/13  Yes Orma Flaming, MD  chlorthalidone (HYGROTON) 25 MG tablet TAKE 1 TABLET BY MOUTH EVERY DAY 10/23/13  Yes Orma Flaming, MD  EPINEPHrine (EPIPEN) 0.3 mg/0.3 mL DEVI Inject 0.3 mLs (0.3 mg total) into the muscle once. 08/10/11  Yes Orma Flaming, MD  ipratropium (ATROVENT) 0.03 % nasal spray Place 2 sprays into the nose 3 (three) times daily. 09/24/12  Yes Eleanore Kurtis Bushman, PA-C  levalbuterol (XOPENEX HFA) 45 MCG/ACT inhaler Inhale 1-2 puffs  into the lungs every 4 (four) hours as needed.   Yes Historical Provider, MD  losartan (COZAAR) 100 MG tablet TAKE 1 TABLET (100 MG TOTAL) BY MOUTH DAILY. 10/23/13  Yes Orma Flaming, MD  metFORMIN (GLUCOPHAGE) 500 MG tablet TAKE 1 TABLET (500 MG TOTAL) BY MOUTH 2 (TWO) TIMES DAILY WITH A MEAL. 10/23/13  Yes Orma Flaming, MD  omeprazole-sodium bicarbonate (ZEGERID) 40-1100 MG per capsule  06/13/11  Yes Historical Provider, MD  ONE TOUCH ULTRA TEST test strip  05/24/11  Yes Historical Provider, MD  simvastatin (ZOCOR) 10 MG tablet Take 10 mg by mouth daily.   Yes Historical Provider, MD  [facility] 160-4.5 MCG/ACT inhaler  06/24/11  Yes Historical Provider, MD  Arvid Right 150 MG injection  07/31/11  Yes Historical Provider, MD  atorvastatin (LIPITOR) 10 MG tablet Take 1 tablet (10 mg total) by mouth daily. NEEDS OFFICE VISIT/LABS FOR ADD'L REFILLS Patient not taking: Reported on 06/28/2014 09/13/12   Theda Sers, PA-C   History   Social History  . Marital Status: Married    Spouse Name: N/A    Number of Children: N/A  . Years of Education: N/A   Occupational History  . Accountant    Social History Main Topics  . Smoking status: Never Smoker   . Smokeless tobacco: Not on file  . Alcohol Use: No  .  Drug Use: No  . Sexual Activity:    Partners: Male    Birth Control/ Protection: Condom   Other Topics Concern  . Not on file   Social History Narrative   Married. Education: college.   Review of Systems  Constitutional: Negative for fever.  HENT: Positive for congestion and sore throat.   Musculoskeletal: Positive for myalgias (generalized).      Objective:   Physical Exam  Constitutional: She is oriented to person, place, and time. She appears well-developed and well-nourished. No distress.  HENT:  Head: Normocephalic and atraumatic.  Minimal cerumen in the left canal, but not obstructive. Left TM is normal.  Right TM is injected with an area of yellow effusion.  Minimal sinus pressure  noted when palpating the maxillary.  Minimal swelling of the turbinates.   Neck: Neck supple.  Cardiovascular: Normal rate.   Pulmonary/Chest: Effort normal. No respiratory distress. She has no wheezes.  Musculoskeletal: Normal range of motion.  Neurological: She is alert and oriented to person, place, and time.  Skin: Skin is warm and dry.  Psychiatric: She has a normal mood and affect. Her behavior is normal.  Nursing note and vitals reviewed.  Filed Vitals:   06/28/14 1022  BP: 110/70  Pulse: 81  Temp: 98.2 F (36.8 C)  TempSrc: Oral  Resp: 16  Height: 5\' 7"  (1.702 m)  Weight: 161 lb (73.029 kg)  SpO2: 100%       Assessment & Plan:   [name] is a 59 y.o. female Acute upper respiratory infection  Right otitis media with effusion - Plan: amoxicillin (AMOXIL) 875 MG tablet  Initial sx's likely viral URI, now with early R AOM.   -start amoxicillin, sx care for pain and arthralgias, sx care for congestion. rtc precautions.   Meds ordered this encounter  Medications  . amoxicillin (AMOXIL) 875 MG tablet    Sig: Take 1 tablet (875 mg total) by mouth 2 (two) times daily.    Dispense:  20 tablet    Refill:  0   Patient Instructions  Start amoxicillin for ear infection. Tylenol or motrin as needed, and sore throat lozenges/cough drops. Return to the clinic or go to the nearest emergency room if any of your symptoms worsen or new symptoms occur.  Upper Respiratory Infection, Adult An upper respiratory infection (URI) is also sometimes known as the common cold. The upper respiratory tract includes the nose, sinuses, throat, trachea, and bronchi. Bronchi are the airways leading to the lungs. Most people improve within 1 week, but symptoms can last up to 2 weeks. A residual cough may last even longer.  CAUSES Many different viruses can infect the tissues lining the upper respiratory tract. The tissues become irritated and inflamed and often become very moist. Mucus  production is also common. A cold is contagious. You can easily spread the virus to others by oral contact. This includes kissing, sharing a glass, coughing, or sneezing. Touching your mouth or nose and then touching a surface, which is then touched by another person, can also spread the virus. SYMPTOMS  Symptoms typically develop 1 to 3 days after you come in contact with a cold virus. Symptoms vary from person to person. They may include:  Runny nose.  Sneezing.  Nasal congestion.  Sinus irritation.  Sore throat.  Loss of voice (laryngitis).  Cough.  Fatigue.  Muscle aches.  Loss of appetite.  Headache.  Low-grade fever. DIAGNOSIS  You might diagnose your own cold based on  familiar symptoms, since most people get a cold 2 to 3 times a year. Your caregiver can confirm this based on your exam. Most importantly, your caregiver can check that your symptoms are not due to another disease such as strep throat, sinusitis, pneumonia, asthma, or epiglottitis. Blood tests, throat tests, and X-rays are not necessary to diagnose a common cold, but they may sometimes be helpful in excluding other more serious diseases. Your caregiver will decide if any further tests are required. RISKS AND COMPLICATIONS  You may be at risk for a more severe case of the common cold if you smoke cigarettes, have chronic heart disease (such as heart failure) or lung disease (such as asthma), or if you have a weakened immune system. The very young and very old are also at risk for more serious infections. Bacterial sinusitis, middle ear infections, and bacterial pneumonia can complicate the common cold. The common cold can worsen asthma and chronic obstructive pulmonary disease (COPD). Sometimes, these complications can require emergency medical care and may be life-threatening. PREVENTION  The best way to protect against getting a cold is to practice good hygiene. Avoid oral or hand contact with people with cold  symptoms. Wash your hands often if contact occurs. There is no clear evidence that vitamin C, vitamin E, echinacea, or exercise reduces the chance of developing a cold. However, it is always recommended to get plenty of rest and practice good nutrition. TREATMENT  Treatment is directed at relieving symptoms. There is no cure. Antibiotics are not effective, because the infection is caused by a virus, not by bacteria. Treatment may include:  Increased fluid intake. Sports drinks offer valuable electrolytes, sugars, and fluids.  Breathing heated mist or steam (vaporizer or shower).  Eating chicken soup or other clear broths, and maintaining good nutrition.  Getting plenty of rest.  Using gargles or lozenges for comfort.  Controlling fevers with ibuprofen or acetaminophen as directed by your caregiver.  Increasing usage of your inhaler if you have asthma. Zinc gel and zinc lozenges, taken in the first 24 hours of the common cold, can shorten the duration and lessen the severity of symptoms. Pain medicines may help with fever, muscle aches, and throat pain. A variety of non-prescription medicines are available to treat congestion and runny nose. Your caregiver can make recommendations and may suggest nasal or lung inhalers for other symptoms.  HOME CARE INSTRUCTIONS   Only take over-the-counter or prescription medicines for pain, discomfort, or fever as directed by your caregiver.  Use a warm mist humidifier or inhale steam from a shower to increase air moisture. This may keep secretions moist and make it easier to breathe.  Drink enough water and fluids to keep your urine clear or pale yellow.  Rest as needed.  Return to work when your temperature has returned to normal or as your caregiver advises. You may need to stay home longer to avoid infecting others. You can also use a face mask and careful hand washing to prevent spread of the virus. SEEK MEDICAL CARE IF:   After the first few  days, you feel you are getting worse rather than better.  You need your caregiver's advice about medicines to control symptoms.  You develop chills, worsening shortness of breath, or brown or red sputum. These may be signs of pneumonia.  You develop yellow or brown nasal discharge or pain in the face, especially when you bend forward. These may be signs of sinusitis.  You develop a fever, swollen neck  glands, pain with swallowing, or white areas in the back of your throat. These may be signs of strep throat. SEEK IMMEDIATE MEDICAL CARE IF:   You have a fever.  You develop severe or persistent headache, ear pain, sinus pain, or chest pain.  You develop wheezing, a prolonged cough, cough up blood, or have a change in your usual mucus (if you have chronic lung disease).  You develop sore muscles or a stiff neck. Document Released: 12/02/2000 Document Revised: 08/31/2011 Document Reviewed: 09/13/2013 [facility] Patient Information 2015 [place], [place]. This information is not intended to replace advice given to you by your health care provider. Make sure you discuss any questions you have with your health care provider.   Otitis Media Otitis media is redness, soreness, and inflammation of the middle ear. Otitis media may be caused by allergies or, most commonly, by infection. Often it occurs as a complication of the common cold. SIGNS AND SYMPTOMS Symptoms of otitis media may include:  Earache.  Fever.  Ringing in your ear.  Headache.  Leakage of fluid from the ear. DIAGNOSIS To diagnose otitis media, your health care provider will examine your ear with an otoscope. This is an instrument that allows your health care provider to see into your ear in order to examine your eardrum. Your health care provider also will ask you questions about your symptoms. TREATMENT  Typically, otitis media resolves on its own within 3-5 days. Your health care provider may prescribe medicine to ease your  symptoms of pain. If otitis media does not resolve within 5 days or is recurrent, your health care provider may prescribe antibiotic medicines if he or she suspects that a bacterial infection is the cause. HOME CARE INSTRUCTIONS   If you were prescribed an antibiotic medicine, finish it all even if you start to feel better.  Take medicines only as directed by your health care provider.  Keep all follow-up visits as directed by your health care provider. SEEK MEDICAL CARE IF:  You have otitis media only in one ear, or bleeding from your nose, or both.  You notice a lump on your neck.  You are not getting better in 3-5 days.  You feel worse instead of better. SEEK IMMEDIATE MEDICAL CARE IF:   You have pain that is not controlled with medicine.  You have swelling, redness, or pain around your ear or stiffness in your neck.  You notice that part of your face is paralyzed.  You notice that the bone behind your ear (mastoid) is tender when you touch it. MAKE SURE YOU:   Understand these instructions.  Will watch your condition.  Will get help right away if you are not doing well or get worse. Document Released: 03/13/2004 Document Revised: 10/23/2013 Document Reviewed: 01/03/2013 [facility] Patient Information 2015 [place], [place]. This information is not intended to replace advice given to you by your health care provider. Make sure you discuss any questions you have with your health care provider.     I personally performed the services described in this documentation, which was scribed in my presence. The recorded information has been reviewed and considered, and addended by me as needed.

## 2014-06-28 NOTE — Patient Instructions (Signed)
Start amoxicillin for ear infection. Tylenol or motrin as needed, and sore throat lozenges/cough drops. Return to the clinic or go to the nearest emergency room if any of your symptoms worsen or new symptoms occur.  Upper Respiratory Infection, Adult An upper respiratory infection (URI) is also sometimes known as the common cold. The upper respiratory tract includes the nose, sinuses, throat, trachea, and bronchi. Bronchi are the airways leading to the lungs. Most people improve within 1 week, but symptoms can last up to 2 weeks. A residual cough may last even longer.  CAUSES Many different viruses can infect the tissues lining the upper respiratory tract. The tissues become irritated and inflamed and often become very moist. Mucus production is also common. A cold is contagious. You can easily spread the virus to others by oral contact. This includes kissing, sharing a glass, coughing, or sneezing. Touching your mouth or nose and then touching a surface, which is then touched by another person, can also spread the virus. SYMPTOMS  Symptoms typically develop 1 to 3 days after you come in contact with a cold virus. Symptoms vary from person to person. They may include:  Runny nose.  Sneezing.  Nasal congestion.  Sinus irritation.  Sore throat.  Loss of voice (laryngitis).  Cough.  Fatigue.  Muscle aches.  Loss of appetite.  Headache.  Low-grade fever. DIAGNOSIS  You might diagnose your own cold based on familiar symptoms, since most people get a cold 2 to 3 times a year. Your caregiver can confirm this based on your exam. Most importantly, your caregiver can check that your symptoms are not due to another disease such as strep throat, sinusitis, pneumonia, asthma, or epiglottitis. Blood tests, throat tests, and X-rays are not necessary to diagnose a common cold, but they may sometimes be helpful in excluding other more serious diseases. Your caregiver will decide if any further tests  are required. RISKS AND COMPLICATIONS  You may be at risk for a more severe case of the common cold if you smoke cigarettes, have chronic heart disease (such as heart failure) or lung disease (such as asthma), or if you have a weakened immune system. The very young and very old are also at risk for more serious infections. Bacterial sinusitis, middle ear infections, and bacterial pneumonia can complicate the common cold. The common cold can worsen asthma and chronic obstructive pulmonary disease (COPD). Sometimes, these complications can require emergency medical care and may be life-threatening. PREVENTION  The best way to protect against getting a cold is to practice good hygiene. Avoid oral or hand contact with people with cold symptoms. Wash your hands often if contact occurs. There is no clear evidence that vitamin C, vitamin E, echinacea, or exercise reduces the chance of developing a cold. However, it is always recommended to get plenty of rest and practice good nutrition. TREATMENT  Treatment is directed at relieving symptoms. There is no cure. Antibiotics are not effective, because the infection is caused by a virus, not by bacteria. Treatment may include:  Increased fluid intake. Sports drinks offer valuable electrolytes, sugars, and fluids.  Breathing heated mist or steam (vaporizer or shower).  Eating chicken soup or other clear broths, and maintaining good nutrition.  Getting plenty of rest.  Using gargles or lozenges for comfort.  Controlling fevers with ibuprofen or acetaminophen as directed by your caregiver.  Increasing usage of your inhaler if you have asthma. Zinc gel and zinc lozenges, taken in the first 24 hours of the common  cold, can shorten the duration and lessen the severity of symptoms. Pain medicines may help with fever, muscle aches, and throat pain. A variety of non-prescription medicines are available to treat congestion and runny nose. Your caregiver can make  recommendations and may suggest nasal or lung inhalers for other symptoms.  HOME CARE INSTRUCTIONS   Only take over-the-counter or prescription medicines for pain, discomfort, or fever as directed by your caregiver.  Use a warm mist humidifier or inhale steam from a shower to increase air moisture. This may keep secretions moist and make it easier to breathe.  Drink enough water and fluids to keep your urine clear or pale yellow.  Rest as needed.  Return to work when your temperature has returned to normal or as your caregiver advises. You may need to stay home longer to avoid infecting others. You can also use a face mask and careful hand washing to prevent spread of the virus. SEEK MEDICAL CARE IF:   After the first few days, you feel you are getting worse rather than better.  You need your caregiver's advice about medicines to control symptoms.  You develop chills, worsening shortness of breath, or brown or red sputum. These may be signs of pneumonia.  You develop yellow or brown nasal discharge or pain in the face, especially when you bend forward. These may be signs of sinusitis.  You develop a fever, swollen neck glands, pain with swallowing, or white areas in the back of your throat. These may be signs of strep throat. SEEK IMMEDIATE MEDICAL CARE IF:   You have a fever.  You develop severe or persistent headache, ear pain, sinus pain, or chest pain.  You develop wheezing, a prolonged cough, cough up blood, or have a change in your usual mucus (if you have chronic lung disease).  You develop sore muscles or a stiff neck. Document Released: 12/02/2000 Document Revised: 08/31/2011 Document Reviewed: 09/13/2013 [facility] Patient Information 2015 [place], [place]. This information is not intended to replace advice given to you by your health care provider. Make sure you discuss any questions you have with your health care provider.   Otitis Media Otitis media is redness,  soreness, and inflammation of the middle ear. Otitis media may be caused by allergies or, most commonly, by infection. Often it occurs as a complication of the common cold. SIGNS AND SYMPTOMS Symptoms of otitis media may include:  Earache.  Fever.  Ringing in your ear.  Headache.  Leakage of fluid from the ear. DIAGNOSIS To diagnose otitis media, your health care provider will examine your ear with an otoscope. This is an instrument that allows your health care provider to see into your ear in order to examine your eardrum. Your health care provider also will ask you questions about your symptoms. TREATMENT  Typically, otitis media resolves on its own within 3-5 days. Your health care provider may prescribe medicine to ease your symptoms of pain. If otitis media does not resolve within 5 days or is recurrent, your health care provider may prescribe antibiotic medicines if he or she suspects that a bacterial infection is the cause. HOME CARE INSTRUCTIONS   If you were prescribed an antibiotic medicine, finish it all even if you start to feel better.  Take medicines only as directed by your health care provider.  Keep all follow-up visits as directed by your health care provider. SEEK MEDICAL CARE IF:  You have otitis media only in one ear, or bleeding from your nose, or  both.  You notice a lump on your neck.  You are not getting better in 3-5 days.  You feel worse instead of better. SEEK IMMEDIATE MEDICAL CARE IF:   You have pain that is not controlled with medicine.  You have swelling, redness, or pain around your ear or stiffness in your neck.  You notice that part of your face is paralyzed.  You notice that the bone behind your ear (mastoid) is tender when you touch it. MAKE SURE YOU:   Understand these instructions.  Will watch your condition.  Will get help right away if you are not doing well or get worse. Document Released: 03/13/2004 Document Revised:  10/23/2013 Document Reviewed: 01/03/2013 [facility] Patient Information 2015 [place], [place]. This information is not intended to replace advice given to you by your health care provider. Make sure you discuss any questions you have with your health care provider.

## 2014-10-09 ENCOUNTER — Other Ambulatory Visit: Payer: Self-pay | Admitting: Internal Medicine

## 2014-10-22 ENCOUNTER — Other Ambulatory Visit: Payer: Self-pay | Admitting: Internal Medicine

## 2014-10-29 ENCOUNTER — Encounter: Payer: Self-pay | Admitting: Family Medicine

## 2014-10-29 ENCOUNTER — Ambulatory Visit (INDEPENDENT_AMBULATORY_CARE_PROVIDER_SITE_OTHER): Payer: 59 | Admitting: Family Medicine

## 2014-10-29 VITALS — BP 108/68 | HR 80 | Temp 98.3°F | Resp 16 | Ht 67.5 in | Wt 157.0 lb

## 2014-10-29 DIAGNOSIS — Z1329 Encounter for screening for other suspected endocrine disorder: Secondary | ICD-10-CM | POA: Diagnosis not present

## 2014-10-29 DIAGNOSIS — E119 Type 2 diabetes mellitus without complications: Secondary | ICD-10-CM

## 2014-10-29 DIAGNOSIS — Z0000 Encounter for general adult medical examination without abnormal findings: Secondary | ICD-10-CM | POA: Diagnosis not present

## 2014-10-29 DIAGNOSIS — Z87892 Personal history of anaphylaxis: Secondary | ICD-10-CM | POA: Diagnosis not present

## 2014-10-29 DIAGNOSIS — Z789 Other specified health status: Secondary | ICD-10-CM | POA: Diagnosis not present

## 2014-10-29 DIAGNOSIS — I10 Essential (primary) hypertension: Secondary | ICD-10-CM | POA: Diagnosis not present

## 2014-10-29 DIAGNOSIS — Z130 Encounter for screening for diseases of the blood and blood-forming organs and certain disorders involving the immune mechanism: Secondary | ICD-10-CM | POA: Diagnosis not present

## 2014-10-29 DIAGNOSIS — K219 Gastro-esophageal reflux disease without esophagitis: Secondary | ICD-10-CM | POA: Diagnosis not present

## 2014-10-29 DIAGNOSIS — E785 Hyperlipidemia, unspecified: Secondary | ICD-10-CM | POA: Diagnosis not present

## 2014-10-29 DIAGNOSIS — Z119 Encounter for screening for infectious and parasitic diseases, unspecified: Secondary | ICD-10-CM | POA: Diagnosis not present

## 2014-10-29 DIAGNOSIS — J4520 Mild intermittent asthma, uncomplicated: Secondary | ICD-10-CM

## 2014-10-29 LAB — COMPREHENSIVE METABOLIC PANEL WITH GFR
ALT: 14 U/L (ref 0–35)
AST: 19 U/L (ref 0–37)
Albumin: 4.1 g/dL (ref 3.5–5.2)
Alkaline Phosphatase: 67 U/L (ref 39–117)
BUN: 20 mg/dL (ref 6–23)
CO2: 26 meq/L (ref 19–32)
Calcium: 9.8 mg/dL (ref 8.4–10.5)
Chloride: 99 meq/L (ref 96–112)
Creat: 1.38 mg/dL — ABNORMAL HIGH (ref 0.50–1.10)
Glucose, Bld: 116 mg/dL — ABNORMAL HIGH (ref 70–99)
Potassium: 4 meq/L (ref 3.5–5.3)
Sodium: 137 meq/L (ref 135–145)
Total Bilirubin: 0.4 mg/dL (ref 0.2–1.2)
Total Protein: 7.4 g/dL (ref 6.0–8.3)

## 2014-10-29 LAB — LIPID PANEL
Cholesterol: 224 mg/dL — ABNORMAL HIGH (ref 0–200)
HDL: 39 mg/dL — ABNORMAL LOW (ref >=46)
LDL CALC: 157 mg/dL — AB (ref 0–99)
Total CHOL/HDL Ratio: 5.7 Ratio
Triglycerides: 140 mg/dL (ref <150)
VLDL: 28 mg/dL (ref 0–40)

## 2014-10-29 LAB — MICROALBUMIN, URINE: Microalb, Ur: 30.2 mg/dL — ABNORMAL HIGH

## 2014-10-29 MED ORDER — METFORMIN HCL 500 MG PO TABS: ORAL_TABLET | ORAL | Status: DC

## 2014-10-29 MED ORDER — OMEPRAZOLE-SODIUM BICARBONATE 40-1100 MG PO CAPS: 1.0000 | ORAL_CAPSULE | Freq: Every day | ORAL | Status: DC

## 2014-10-29 MED ORDER — LOSARTAN POTASSIUM 100 MG PO TABS: ORAL_TABLET | ORAL | Status: DC

## 2014-10-29 MED ORDER — BUDESONIDE-FORMOTEROL FUMARATE 160-4.5 MCG/ACT IN AERO: 2.0000 | INHALATION_SPRAY | Freq: Two times a day (BID) | RESPIRATORY_TRACT | Status: DC

## 2014-10-29 MED ORDER — SIMVASTATIN 10 MG PO TABS: 10.0000 mg | ORAL_TABLET | Freq: Every day | ORAL | Status: DC

## 2014-10-29 MED ORDER — LEVALBUTEROL TARTRATE 45 MCG/ACT IN AERO: 1.0000 | INHALATION_SPRAY | RESPIRATORY_TRACT | Status: DC | PRN

## 2014-10-29 MED ORDER — CHLORTHALIDONE 25 MG PO TABS: 25.0000 mg | ORAL_TABLET | Freq: Every day | ORAL | Status: DC

## 2014-10-29 MED ORDER — EPINEPHRINE 0.3 MG/0.3ML IJ SOAJ: 0.3000 mg | Freq: Once | INTRAMUSCULAR | Status: DC

## 2014-10-29 NOTE — Progress Notes (Addendum)
[facility] [address], [place] [address] [phone]  Date:  10/29/2014   Name:  Ashley Villarreal   DOB:  1955-07-28   MRN:  [id number]  PCP:  Kennon Portela, MD    Chief Complaint: Annual Exam and Medication Refill   History of Present Illness:  Ashley Villarreal is a 59 y.o. very pleasant female patient who presents with the following:  Here today for a CPE. History of well controlled DM She sees an OBG for her pap and mammograms.  She is fasting today for labs Her colonoscopy and eye exams are UTD Generally she is feeling very well, she is a never smoker and does not drink alcohol She would like to get more exercise and plans to work more on this She notes that she was told she might be able to reduce her BP medication given her good control.  No other CV problems as far as she knows   Lab Results  Component Value Date   HGBA1C 7.0 10/13/2013   BP Readings from Last 3 Encounters:  10/29/14 108/68  06/28/14 110/70  04/28/14 128/70     Patient Active Problem List   Diagnosis Date Noted  . HTN (hypertension) 08/10/2011  . DM (diabetes mellitus screen) 08/10/2011  . Hyperlipemia 08/10/2011  . Asthma with acute exacerbation 08/10/2011  . Allergy history unknown 08/10/2011    Past Medical History  Diagnosis Date  . Allergy   . Diabetes mellitus without complication   . Asthma   . Hypertension   . COPD (chronic obstructive pulmonary disease)     Past Surgical History  Procedure Laterality Date  . Tonsillectomy  1975    History  Substance Use Topics  . Smoking status: Never Smoker   . Smokeless tobacco: Not on file  . Alcohol Use: No    Family History  Problem Relation Age of Onset  . Stroke Father   . Heart disease Father   . Diabetes Mother   . Heart disease Mother   . Stroke Mother   . Hyperlipidemia Sister   . Hyperlipidemia Brother   . Diabetes Paternal Grandmother   . Hypertension Brother     Allergies   Allergen Reactions  . Claritin [Loratadine]     dizziness  . Other     Magic mouthwash    Medication list has been reviewed and updated.  Current Outpatient Prescriptions on File Prior to Visit  Medication Sig Dispense Refill  . BYSTOLIC 10 MG tablet TAKE 1 TABLET (10 MG TOTAL) BY MOUTH DAILY. 30 tablet 0  . chlorthalidone (HYGROTON) 25 MG tablet TAKE 1 TABLET BY MOUTH EVERY DAY 30 tablet 0  . EPINEPHrine (EPIPEN) 0.3 mg/0.3 mL DEVI Inject 0.3 mLs (0.3 mg total) into the muscle once. 2 Device 3  . levalbuterol (XOPENEX HFA) 45 MCG/ACT inhaler Inhale 1-2 puffs into the lungs every 4 (four) hours as needed.    Marland Kitchen losartan (COZAAR) 100 MG tablet TAKE 1 TABLET (100 MG TOTAL) BY MOUTH DAILY. 30 tablet 0  . metFORMIN (GLUCOPHAGE) 500 MG tablet TAKE 1 TABLET (500 MG TOTAL) BY MOUTH 2 (TWO) TIMES DAILY WITH A MEAL. 60 tablet 0  . omeprazole-sodium bicarbonate (ZEGERID) 40-1100 MG per capsule     . simvastatin (ZOCOR) 10 MG tablet Take 10 mg by mouth daily.    . SYMBICORT 160-4.5 MCG/ACT inhaler     . XOLAIR 150 MG injection     . atorvastatin (LIPITOR) 10 MG  tablet Take 1 tablet (10 mg total) by mouth daily. NEEDS OFFICE VISIT/LABS FOR ADD'L REFILLS (Patient not taking: Reported on 06/28/2014) 30 tablet 0  . ipratropium (ATROVENT) 0.03 % nasal spray Place 2 sprays into the nose 3 (three) times daily. (Patient not taking: Reported on 10/29/2014) 30 mL 0  . ONE TOUCH ULTRA TEST test strip     . simvastatin (ZOCOR) 10 MG tablet TAKE 1 TABLET (10 MG TOTAL) BY MOUTH AT BEDTIME 90 tablet 0   No current facility-administered medications on file prior to visit.    Review of Systems:  As per HPI- otherwise negative. 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 Ideal Body Weight: Weight in (lb) to have BMI = 25: 161.7  GEN: WDWN, NAD, Non-toxic, A & O x 3, looks well HEENT: Atraumatic, Normocephalic. Neck supple. No masses, No LAD.  Bilateral TM wnl, oropharynx normal.  PEERL,EOMI.   Ears and Nose: No external deformity. CV: RRR, No M/G/R. No JVD. No thrill. No extra heart sounds. PULM: CTA B, no wheezes, crackles, rhonchi. No retractions. No resp. distress. No accessory muscle use. ABD: S, NT, ND, +BS. No rebound. No HSM. EXTR: No c/c/e NEURO Normal gait.  PSYCH: Normally interactive. Conversant. Not depressed or anxious appearing.  Calm demeanor.    Assessment and Plan: Physical exam  History of anaphylaxis - Plan: EPINEPHrine (EPIPEN 2-PAK) 0.3 mg/0.3 mL IJ SOAJ injection  Allergy history unknown  Essential hypertension - Plan: losartan (COZAAR) 100 MG tablet, chlorthalidone (HYGROTON) 25 MG tablet  Asthma, chronic, mild intermittent, uncomplicated - Plan: levalbuterol (XOPENEX HFA) 45 MCG/ACT inhaler, budesonide-formoterol (SYMBICORT) 160-4.5 MCG/ACT inhaler  Diabetes mellitus type 2, controlled - Plan: metFORMIN (GLUCOPHAGE) 500 MG tablet, Comprehensive metabolic panel, Hemoglobin A1c, Microalbumin, urine  Hyperlipidemia - Plan: simvastatin (ZOCOR) 10 MG tablet, Lipid panel  Screening examination for infectious disease - Plan: HIV antibody  Screening for deficiency anemia - Plan: CBC  Screening for hypothyroidism - Plan: TSH  Gastroesophageal reflux disease without esophagitis - Plan: omeprazole-sodium bicarbonate (ZEGERID) 40-1100 MG per capsule   Remove bystolic as her BP is well controlled - continue other medications for now Refilled her medications for DM, lipids, GERD and asthma Follow-up with her pending labs  Signed Lamar Blinks, MD  Results for orders placed or performed in visit on 10/29/14  CBC  Result Value  Ref Range   WBC 8.0 4.0 - 10.5 K/uL   RBC 4.28 3.87 - 5.11 MIL/uL   Hemoglobin 11.3 (L) 12.0 - 15.0 g/dL   HCT 35.0 (L) 36.0 - 46.0 %   MCV 81.8 78.0 - 100.0 fL   MCH 26.4 26.0 - 34.0 pg   MCHC 32.3 30.0 - 36.0 g/dL   RDW 15.0 11.5 - 15.5 %   Platelets 290 150 - 400 K/uL   MPV 12.5 (H) 8.6 - 12.4 fL  Comprehensive metabolic panel  Result Value Ref Range   Sodium 137 135 - 145 mEq/L   Potassium 4.0 3.5 - 5.3 mEq/L   Chloride 99 96 - 112 mEq/L   CO2 26 19 - 32  mEq/L   Glucose, Bld 116 (H) 70 - 99 mg/dL   BUN 20 6 - 23 mg/dL   Creat 1.38 (H) 0.50 - 1.10 mg/dL   Total Bilirubin 0.4 0.2 - 1.2 mg/dL   Alkaline Phosphatase 67 39 - 117 U/L   AST 19 0 - 37 U/L   ALT 14 0 - 35 U/L   Total Protein 7.4 6.0 - 8.3 g/dL   Albumin 4.1 3.5 - 5.2 g/dL   Calcium 9.8 8.4 - 10.5 mg/dL  TSH  Result Value Ref Range   TSH 0.565 0.350 - 4.500 uIU/mL  Lipid panel  Result Value Ref Range   Cholesterol 224 (H) 0 - 200 mg/dL   Triglycerides 140 <150 mg/dL   HDL 39 (L) >=46 mg/dL   Total CHOL/HDL Ratio 5.7 Ratio   VLDL 28 0 - 40 mg/dL   LDL Cholesterol 157 (H) 0 - 99 mg/dL  Hemoglobin A1c  Result Value Ref Range   Hgb A1c MFr Bld 7.5 (H) <5.7 %   Mean Plasma Glucose 169 (H) <117 mg/dL  Microalbumin, urine  Result Value Ref Range   Microalb, Ur 30.2 (H) <2.0 mg/dL  HIV antibody  Result Value Ref Range   HIV 1&2 Ab, 4th Generation NONREACTIVE NONREACTIVE    Called 5/11 to go over her labs.  Calculated creat clearance/ GFR at 51. Renal function is borderline - will need to watch closely.  Stop chlorthalidone- she reports that her BP is doing well without bystolic and we hope stopping diuretic will also help her kidneys.  We can still use metformin but will need to keep a close watch on her renal function.  Add glipizide 5mg  to bring her A1c down.   Increase zocor to 20 mg as her cholesterol is not at goal Refer to nephrology; explained that she will be a low- priority case as her renal function  is still adequate so her appt may not be for several months.  However would like to establish care with nephrology to help [place] follow her care and preserve/ maximize renal function

## 2014-10-29 NOTE — Patient Instructions (Signed)
I will be in touch with your labs asap You can stop the bystolic as your BP looks great.  Let me know if your BP starts going up Work on getting some more exercise- a walking program is ideal.   Take care!

## 2014-10-30 ENCOUNTER — Encounter: Payer: Self-pay | Admitting: Family Medicine

## 2014-10-30 ENCOUNTER — Telehealth: Payer: Self-pay

## 2014-10-30 LAB — HIV ANTIBODY (ROUTINE TESTING W REFLEX): HIV 1&2 Ab, 4th Generation: NONREACTIVE

## 2014-10-30 LAB — CBC
HCT: 35 % — ABNORMAL LOW (ref 36.0–46.0)
HEMOGLOBIN: 11.3 g/dL — AB (ref 12.0–15.0)
MCH: 26.4 pg (ref 26.0–34.0)
MCHC: 32.3 g/dL (ref 30.0–36.0)
MCV: 81.8 fL (ref 78.0–100.0)
MPV: 12.5 fL — AB (ref 8.6–12.4)
Platelets: 290 10*3/uL (ref 150–400)
RBC: 4.28 MIL/uL (ref 3.87–5.11)
RDW: 15 % (ref 11.5–15.5)
WBC: 8 10*3/uL (ref 4.0–10.5)

## 2014-10-30 LAB — HEMOGLOBIN A1C
HEMOGLOBIN A1C: 7.5 % — AB (ref <5.7)
Mean Plasma Glucose: 169 mg/dL — ABNORMAL HIGH (ref <117)

## 2014-10-30 LAB — TSH: TSH: 0.565 u[IU]/mL (ref 0.350–4.500)

## 2014-10-30 NOTE — Telephone Encounter (Signed)
PA needed for Symbicort 160-4.5 mcg. Called [facility] and was advised that pt needs to have tried/failed both Advair and Dulera before Symbicort will be covered. I called pt and LMOM to CB to report if she has ever used these two inhalers.

## 2014-10-31 MED ORDER — GLIPIZIDE 5 MG PO TABS: 5.0000 mg | ORAL_TABLET | Freq: Every day | ORAL | Status: DC

## 2014-10-31 MED ORDER — SIMVASTATIN 20 MG PO TABS: ORAL_TABLET | ORAL | Status: DC

## 2014-10-31 NOTE — Addendum Note (Signed)
Addended by: Lamar Blinks C on: 10/31/2014 06:19 PM   Modules accepted: Orders

## 2014-11-02 NOTE — Telephone Encounter (Signed)
Spoke with pt, Yes she has tried both medications. The Advair was fine but the [facility] made her dizzy. She is going to see her allergist and will speak to him about this. She will let us know if she needs our help.

## 2014-11-02 NOTE — Telephone Encounter (Signed)
Patient called back to speak with Ms. Pamala Hurry in regards to the Symbicort. Please call! [phone]

## 2014-11-15 ENCOUNTER — Other Ambulatory Visit: Payer: Self-pay | Admitting: Family Medicine

## 2014-11-20 ENCOUNTER — Ambulatory Visit (INDEPENDENT_AMBULATORY_CARE_PROVIDER_SITE_OTHER): Payer: 59 | Admitting: Physician Assistant

## 2014-11-20 VITALS — BP 138/74 | HR 92 | Temp 99.0°F | Resp 12 | Ht 67.5 in | Wt 161.6 lb

## 2014-11-20 DIAGNOSIS — J019 Acute sinusitis, unspecified: Secondary | ICD-10-CM | POA: Diagnosis not present

## 2014-11-20 DIAGNOSIS — J309 Allergic rhinitis, unspecified: Secondary | ICD-10-CM

## 2014-11-20 DIAGNOSIS — J4520 Mild intermittent asthma, uncomplicated: Secondary | ICD-10-CM | POA: Diagnosis not present

## 2014-11-20 MED ORDER — DOXYCYCLINE HYCLATE 100 MG PO CAPS: 100.0000 mg | ORAL_CAPSULE | Freq: Two times a day (BID) | ORAL | Status: DC

## 2014-11-20 NOTE — Progress Notes (Signed)
[facility] [address], [place] [address] [phone]  Date:  11/20/2014   Name:  Ashley Villarreal   DOB:  Jun 03, 1956   MRN:  [id number]  PCP:  Kennon Portela, MD    Chief Complaint: Sinusitis; Nasal Congestion; and Headache   History of Present Illness:  This is a 59 y.o. female with PMH DM, HTN, HLD and asthma who is presenting with nasal congestion and sinus headache x 9 days. Took amoxicillin BID x 2 days at start of symptoms. She had leftover pills at home. Having post-nasal drip, no nasal discharge. Having sore throat that started over the past few days. Having a dry cough. Feels she is coughing up something in her throat. She has not noticed any SOB or wheezing although her husband has mentioned she is wheezing during her sleep. Felt feverish and chilled. Temp of 99 here today. She tried a saline spray last night that did not help. She has not tried anything else and feels her symptoms are worsening. Used to get very frequent sinus infections. Now only gets 1 a year. Last sinus infection 6 months ago. States she started getting allergy shots and this reduced the frequency. She uses xopenex and symbicort for asthma. She uses a nasal spray prescribed by her allergist daily but can't remember the name, she thinks maybe nasonex. She is not a smoker. She denies otalgia, myalgias, hoarseness, adenopathy.  Review of Systems:  Review of Systems  Constitutional: Positive for fever, chills and fatigue.  HENT: Positive for congestion, sinus pressure and sore throat. Negative for ear pain, rhinorrhea, trouble swallowing and voice change.   Eyes: Negative for redness.  Respiratory: Positive for cough and wheezing. Negative for chest tightness and shortness of breath.   Gastrointestinal: Negative for nausea and vomiting.  Skin: Negative for rash.  Allergic/Immunologic: Positive for environmental allergies.  Neurological: Positive for headaches.  Hematological:  Negative for adenopathy.  Psychiatric/Behavioral: Negative for sleep disturbance.    Patient Active Problem List   Diagnosis Date Noted  . HTN (hypertension) 08/10/2011  . DM (diabetes mellitus screen) 08/10/2011  . Hyperlipemia 08/10/2011  . Asthma with acute exacerbation 08/10/2011  . Allergy history unknown 08/10/2011    Prior to Admission medications   Medication Sig Start Date End Date Taking? Authorizing Provider  budesonide-formoterol (SYMBICORT) 160-4.5 MCG/ACT inhaler Inhale 2 puffs into the lungs 2 (two) times daily. 10/29/14  Yes Gay Filler Copland, MD  chlorthalidone (HYGROTON) 25 MG tablet Take 1 tablet (25 mg total) by mouth daily. 10/29/14  Yes Gay Filler Copland, MD  EPINEPHrine (EPIPEN 2-PAK) 0.3 mg/0.3 mL IJ SOAJ injection Inject 0.3 mLs (0.3 mg total) into the muscle once. 10/29/14  Yes Gay Filler Copland, MD  EPINEPHrine (EPIPEN) 0.3 mg/0.3 mL DEVI Inject 0.3 mLs (0.3 mg total) into the muscle once. 08/10/11  Yes Orma Flaming, MD  glipiZIDE (GLUCOTROL) 5 MG tablet Take 1 tablet (5 mg total) by mouth daily before breakfast. 10/31/14  Yes Gay Filler Copland, MD  levalbuterol (XOPENEX HFA) 45 MCG/ACT inhaler Inhale 1-2 puffs into the lungs every 4 (four) hours as needed. 10/29/14  Yes Gay Filler Copland, MD  metFORMIN (GLUCOPHAGE) 500 MG tablet TAKE 1 TABLET (500 MG TOTAL) BY MOUTH 2 (TWO) TIMES DAILY WITH A MEAL. 10/29/14  Yes Gay Filler Copland, MD  omeprazole-sodium bicarbonate (ZEGERID) 40-1100 MG per capsule Take 1 capsule by mouth daily before breakfast. 10/29/14  Yes Darreld Mclean, MD  [place]  TEST test strip  05/24/11  Yes Historical Provider, MD  simvastatin (ZOCOR) 20 MG tablet Take one daily for cholesterol 10/31/14  Yes Jessica C Copland, MD  losartan (COZAAR) 100 MG tablet TAKE 1 TABLET (100 MG TOTAL) BY MOUTH DAILY. Patient not taking: Reported on 11/20/2014 10/29/14   Darreld Mclean, MD    Allergies  Allergen Reactions  . Claritin [Loratadine]     dizziness  .  Other     Magic mouthwash    Past Surgical History  Procedure Laterality Date  . Tonsillectomy  1975    History  Substance Use Topics  . Smoking status: Never Smoker   . Smokeless tobacco: Not on file  . Alcohol Use: No    Family History  Problem Relation Age of Onset  . Stroke Father   . Heart disease Father   . Diabetes Mother   . Heart disease Mother   . Stroke Mother   . Hyperlipidemia Sister   . Hyperlipidemia Brother   . Diabetes Paternal Grandmother   . Hypertension Brother     Medication list has been reviewed and updated.  Physical Examination:  Physical Exam  Constitutional: She is oriented to person, place, and time. She appears well-developed and well-nourished. No distress.  HENT:  Head: Normocephalic and atraumatic.  Right Ear: Hearing, tympanic membrane, external ear and ear canal normal.  Left Ear: Hearing, tympanic membrane, external ear and ear canal normal.  Nose: Mucosal edema present. Right sinus exhibits maxillary sinus tenderness and frontal sinus tenderness. Left sinus exhibits maxillary sinus tenderness and frontal sinus tenderness.  Mouth/Throat: Uvula is midline and mucous membranes are normal. Posterior oropharyngeal erythema present. No oropharyngeal exudate or posterior oropharyngeal edema.  Maxillary tenderness > frontal  Eyes: Conjunctivae and lids are normal. Right eye exhibits no discharge. Left eye exhibits no discharge. No scleral icterus.  Cardiovascular: Normal rate, regular rhythm, normal heart sounds and normal pulses.   No murmur heard. Pulmonary/Chest: Effort normal and breath sounds normal. No respiratory distress. She has no wheezes. She has no rhonchi. She has no rales.  Musculoskeletal: Normal range of motion.  Lymphadenopathy:       Head (right side): No submental, no submandibular and no tonsillar adenopathy present.       Head (left side): No submental, no submandibular and no tonsillar adenopathy present.    She has  no cervical adenopathy.  Neurological: She is alert and oriented to person, place, and time.  Skin: Skin is warm, dry and intact. No lesion and no rash noted.  Psychiatric: She has a normal mood and affect. Her speech is normal and behavior is normal. Thought content normal.   BP 138/74 mmHg  Pulse 92  Temp(Src) 99 F (37.2 C) (Oral)  Resp 12  Ht 5' 7.5" (1.715 m)  Wt 161 lb 9.6 oz (73.301 kg)  BMI 24.92 kg/m2  SpO2 98%  Assessment and Plan:  1. Acute sinusitis, recurrence not specified, unspecified location 2. Allergic rhinitis, unspecified allergic rhinitis type 3. Asthma, chronic, mild intermittent, uncomplicated Prescribed doxy for sinusitis. We discussed the importance of taking all pills even if feeling better and never taking leftover antibiotics at home without a diagnosis. She will continue inhalers for asthma and nasonex. Once feeling a little better she will go to her allergist for allergy shot as she is due. She will return in 7-10 days if symptoms not improving.  - doxycycline (VIBRAMYCIN) 100 MG capsule; Take 1 capsule (100 mg total) by mouth 2 (two)  times daily.  Dispense: 20 capsule; Refill: 0  Benjaman Pott. Drenda Freeze, MHS [facility]  11/20/2014

## 2014-11-20 NOTE — Patient Instructions (Signed)
Take antibiotic twice a day for 10 days - take all the pills.  Do not take leftover pills at home in the future. Continue inhalers and nasal spray. In a few days when feeling a little better you can go get your allergy shot. Return if not getting better in 7-10 days.

## 2014-11-24 ENCOUNTER — Ambulatory Visit (INDEPENDENT_AMBULATORY_CARE_PROVIDER_SITE_OTHER): Payer: 59 | Admitting: Family Medicine

## 2014-11-24 ENCOUNTER — Ambulatory Visit (INDEPENDENT_AMBULATORY_CARE_PROVIDER_SITE_OTHER): Payer: 59

## 2014-11-24 VITALS — BP 126/74 | HR 94 | Temp 98.4°F | Resp 18 | Ht 67.25 in | Wt 159.0 lb

## 2014-11-24 DIAGNOSIS — R0600 Dyspnea, unspecified: Secondary | ICD-10-CM

## 2014-11-24 DIAGNOSIS — J4541 Moderate persistent asthma with (acute) exacerbation: Secondary | ICD-10-CM | POA: Diagnosis not present

## 2014-11-24 DIAGNOSIS — R059 Cough, unspecified: Secondary | ICD-10-CM

## 2014-11-24 DIAGNOSIS — I517 Cardiomegaly: Secondary | ICD-10-CM | POA: Diagnosis not present

## 2014-11-24 DIAGNOSIS — R05 Cough: Secondary | ICD-10-CM

## 2014-11-24 DIAGNOSIS — E119 Type 2 diabetes mellitus without complications: Secondary | ICD-10-CM

## 2014-11-24 DIAGNOSIS — J209 Acute bronchitis, unspecified: Secondary | ICD-10-CM | POA: Diagnosis not present

## 2014-11-24 LAB — GLUCOSE, POCT (MANUAL RESULT ENTRY): POC Glucose: 117 mg/dl — AB (ref 70–99)

## 2014-11-24 LAB — POCT CBC
GRANULOCYTE PERCENT: 71 % (ref 37–80)
HCT, POC: 33.9 % — AB (ref 37.7–47.9)
Hemoglobin: 10.5 g/dL — AB (ref 12.2–16.2)
LYMPH, POC: 2.8 (ref 0.6–3.4)
MCH, POC: 25.4 pg — AB (ref 27–31.2)
MCHC: 30.9 g/dL — AB (ref 31.8–35.4)
MCV: 82.4 fL (ref 80–97)
MID (cbc): 0.8 (ref 0–0.9)
MPV: 8.8 fL (ref 0–99.8)
POC Granulocyte: 8.7 — AB (ref 2–6.9)
POC LYMPH PERCENT: 22.9 %L (ref 10–50)
POC MID %: 6.1 % (ref 0–12)
Platelet Count, POC: 339 10*3/uL (ref 142–424)
RBC: 4.11 M/uL (ref 4.04–5.48)
RDW, POC: 14.7 %
WBC: 12.3 10*3/uL — AB (ref 4.6–10.2)

## 2014-11-24 MED ORDER — IPRATROPIUM-ALBUTEROL 0.5-2.5 (3) MG/3ML IN SOLN: 3.0000 mL | RESPIRATORY_TRACT | Status: DC | PRN

## 2014-11-24 MED ORDER — IPRATROPIUM BROMIDE 0.02 % IN SOLN
0.5000 mg | Freq: Once | RESPIRATORY_TRACT | Status: AC
  Administered 2014-11-24: 0.5 mg via RESPIRATORY_TRACT

## 2014-11-24 MED ORDER — AZITHROMYCIN 250 MG PO TABS: ORAL_TABLET | ORAL | Status: DC

## 2014-11-24 MED ORDER — ALBUTEROL SULFATE (2.5 MG/3ML) 0.083% IN NEBU
2.5000 mg | INHALATION_SOLUTION | Freq: Once | RESPIRATORY_TRACT | Status: AC
Start: 2014-11-24 — End: 2014-11-24
  Administered 2014-11-24: 2.5 mg via RESPIRATORY_TRACT

## 2014-11-24 MED ORDER — PREDNISONE 20 MG PO TABS: ORAL_TABLET | ORAL | Status: DC

## 2014-11-24 NOTE — Patient Instructions (Addendum)
Use the nebulizer machine every 4-6 hours if needed for shortness of breath. Recommend using only when acute problems. Am prescribing DuoNeb which is a combination of albuterol and ipratropium to use when necessary   Continue your regular breathing medicines  Follow up with your allergy doctor  Take azithromycin 2 pills initially, then 1 daily for 4 days for infection. Stop the doxycycline.  Take the prednisone 3 tablets daily for 2 days, then 2 doses 2 days, then 1 daily for 2 days  Referral is being made to cardiology for evaluation of the possibly enlarged heart. Our office should contact you when they set this up, I have recommended seeing Dr. Tollie Eth.  You are diabetic. You should take your medicines faithfully. You should avoid sweets and excessive starches. You should get regular exercise such as walking.  Advise going online and reading the American Diabetic Association website

## 2014-11-24 NOTE — Progress Notes (Signed)
Subjective:  Patient ID: Ashley Villarreal, female    DOB: January 12, 1956  Age: 59 y.o. MRN: [id number]  59 year old lady with a history of asthma and allergies. He was in here a week ago and treated for her cough and respiratory problems at that time. She was given a course of doxycycline. She is on inhaled therapies at home but does not have a nebulizer machine. She does not smoke. She does usually go to an allergist and get a monthly shot, but was unable to this month because of illness. She has attempted to take the doxycycline this week, but has vomited up some it causes her stomach to be sick. She is not getting better, continues to have bad coughing spells and is wheezing. Uses her inhalers. However had to come back in here today because she was not doing well.  She has a history of being "prediabetic", however in reviewing her last record I see that she had a recent A1c of 7.5 cm explained to her that she does indeed have type 2 diabetes mellitus.  Review of systems: Patient does have headaches mostly associated with her coughing hard. She feels weak and tired because she's been sick. She has some fainting spells when she gets coughing real hard. She does not smoke. No chest pains or palpitations except for some chest discomfort with coughing. Respiratory as above. GI unremarkable. GU unremarkable. Musculoskeletal unremarkable. Dermatologic unremarkable. Neurologic has the little fainting spells when she coughs real hard. Emotionally she is doing okay. She has not read up on diabetes much herself, though her husband has. She does get some general body edema when she eats salt.   Objective:   Patient obviously doesn't feel well. She is coughing intermittently. Her TMs normal. Throat clear. Neck supple without significant nodes. Chest has scattered wheezing intermittently, which coughs. Heart regular without murmurs. Abdomen soft without mass or tenderness. She got tight and short of breath while later  down to feel her abdomen. When she sat back up she had a very brief syncopal episode, falling backward. She was not really unconscious, lasting only about 15 seconds.. She was able to see Jori Moll back up with assistance. It was the motion of laying down and sitting up. Had caused her to have enough bronchospasm. O2 saturation 94% was noted on admission. Attempted to do a peak flow but it caused coughing when she tried to go on and she did not move the needle. No edema at this time.  Results for orders placed or performed in visit on 11/24/14  POCT CBC  Result Value Ref Range   WBC 12.3 (A) 4.6 - 10.2 K/uL   Lymph, poc 2.8 0.6 - 3.4   POC LYMPH PERCENT 22.9 10 - 50 %L   MID (cbc) 0.8 0 - 0.9   POC MID % 6.1 0 - 12 %M   POC Granulocyte 8.7 (A) 2 - 6.9   Granulocyte percent 71.0 37 - 80 %G   RBC 4.11 4.04 - 5.48 M/uL   Hemoglobin 10.5 (A) 12.2 - 16.2 g/dL   HCT, POC 33.9 (A) 37.7 - 47.9 %   MCV 82.4 80 - 97 fL   MCH, POC 25.4 (A) 27 - 31.2 pg   MCHC 30.9 (A) 31.8 - 35.4 g/dL   RDW, POC 14.7 %   Platelet Count, POC 339 142 - 424 K/uL   MPV 8.8 0 - 99.8 fL  POCT glucose (manual entry)  Result Value Ref Range   POC Glucose 117 (  A) 70 - 99 mg/dl   [facility] reading (PRIMARY) by  Dr. Linna Darner Cardiomegaly. No acute infiltrates..  Glucose is good  Will treat with steroids, nebulizer, antibodies, counseled on diabetes, and spent, sooner with patient. Peak flow was satisfactory at 380. She was unable to move the peak flow meter before the treatment. Recheck of her lungs still have some scattered wheezes. She is much better and smiling now. No more syncopal-like episodes.    Assessment & Plan:   Assessment: Asthma Acute bronchitis and bronchospasm Cardiomegaly Type 2 diabetes Mild syncopal episode Headache  Spent approximately 1 hour with the patient. Approximately one half of the time was spent in counseling.  Plan: Patient Instructions  Use the nebulizer machine every 4-6 hours if  needed for shortness of breath. Recommend using only when acute problems. Am prescribing DuoNeb which is a combination of albuterol and ipratropium to use when necessary   Continue your regular breathing medicines  Follow up with your allergy doctor  Take azithromycin 2 pills initially, then 1 daily for 4 days for infection. Stop the doxycycline.  Take the prednisone 3 tablets daily for 2 days, then 2 doses 2 days, then 1 daily for 2 days  Referral is being made to cardiology for evaluation of the possibly enlarged heart. Our office should contact you when they set this up, I have recommended seeing Dr. Tollie Eth.  You are diabetic. You should take your medicines faithfully. You should avoid sweets and excessive starches. You should get regular exercise such as walking.  Advise going online and reading the American Diabetic Association website              Stacy Sailer, MD 11/24/2014

## 2014-11-25 LAB — BASIC METABOLIC PANEL
BUN: 14 mg/dL (ref 6–23)
CO2: 25 mEq/L (ref 19–32)
CREATININE: 1.17 mg/dL — AB (ref 0.50–1.10)
Calcium: 9.9 mg/dL (ref 8.4–10.5)
Chloride: 101 mEq/L (ref 96–112)
Glucose, Bld: 105 mg/dL — ABNORMAL HIGH (ref 70–99)
Potassium: 4.4 mEq/L (ref 3.5–5.3)
Sodium: 140 mEq/L (ref 135–145)

## 2014-11-25 LAB — BRAIN NATRIURETIC PEPTIDE: Brain Natriuretic Peptide: 16.6 pg/mL (ref 0.0–100.0)

## 2014-12-05 ENCOUNTER — Other Ambulatory Visit: Payer: Self-pay | Admitting: Family Medicine

## 2014-12-20 ENCOUNTER — Encounter: Payer: Self-pay | Admitting: Cardiology

## 2014-12-20 NOTE — Progress Notes (Signed)
Patient ID: Ashley Villarreal, female   DOB: [birth year], 59 y.o.   MRN: [id number]   Ashley Villarreal    Date of visit:  12/20/2014 DOB:  1955-08-03    Age:  59 yrs. Medical record number:  [id number]     Account number:  [id number] Primary Care Provider: IRCVEL,FYBOF ____________________________ CURRENT DIAGNOSES  1. Cardiomegaly  2. Syncope and collapse  3. Asthma  4. Hyperlipidemia  5. Type 2 diabetes mellitus without complications  6. Snoring ____________________________ ALLERGIES  Claritin, Intolerance-dizziness  Doxycycline, Intolerance-unknown ____________________________ MEDICATIONS  1. Symbicort 160 mcg-4.5 mcg/actuation HFA aerosol inhaler, 2 puff bid  2. chlorthalidone 25 mg tablet, 1 p.o. daily  3. EpiPen 0.3 mg/0.3 mL injection, auto-injector, Take as directed  4. glipizide 5 mg tablet, 1 p.o. daily  5. ipratropium-albuterol 0.5 mg-3 mg(2.5 mg base)/3 mL nebulization soln, PRN  6. Xopenex HFA 45 mcg/actuation aerosol inhaler, PRN  7. losartan 100 mg tablet, 1 p.o. daily  8. metformin 500 mg tablet, BID  9. Zegerid 40 mg-1.1 gram capsule, 1 p.o. daily  10. prednisone 20 mg tablet, Take as directed  11. simvastatin 20 mg tablet, 1 p.o. daily ____________________________ CHIEF COMPLAINTS  Coughing has syncope ____________________________ HISTORY OF PRESENT ILLNESS This very nice 59 year old female seen at the request of Dr. Linna Darner for evaluation of cough-induced syncope and cardiomegaly. The patient has a long-standing history of asthma. She also has hyperlipidemia and a prior history of hypertension. Her a hemoglobin A1c was also recently increased. She was seen recently with increasing shortness of breath and had an episode of syncope in the doctor's office where she was coughing and having a severe asthma exacerbation. This is doing better at the present time. She had cardiomegaly noted on x-ray and was asked to have a cardiology evaluation. She does not have any angina and is  able to walk on weekends with her husband. She is just now getting over afebrile a significant asthma exacerbation. She denies PND orthopnea and does not have claudication. ____________________________ PAST HISTORY  Past Medical Illnesses:  asthma, DM-non-insulin dependent, hyperlipidemia, hypertension, GERD;  Cardiovascular Illnesses:  no previous history of cardiac disease.;  Surgical Procedures:  tonsillectomy, nose surgery;  NYHA Classification:  I;  Canadian Angina Classification:  Class 0: Asymptomatic;  Cardiology Procedures-Invasive:  no history of prior cardiac procedures;  Cardiology Procedures-Noninvasive:  no previous non-invasive procedures;  LVEF not documented,   ____________________________ CARDIO-PULMONARY TEST DATES EKG Date:  12/20/2014;  Chest Xray Date: 11/24/2014;   ____________________________ FAMILY HISTORY Brother -- Brother alive and well Brother -- Brother alive with problem, Hypercholesterolemia Brother -- Brother alive and well Father -- Father alive with problem, CVA, Heart Attack Mother -- Mother alive with problem, Heart Attack, Coronary artery bypass grafting Sister -- Sister alive with problem, Primary fibromyalgia syndrome Sister -- Sister alive and well ____________________________ SOCIAL HISTORY Alcohol Use:  no alcohol use;  Smoking:  never smoked;  Diet:  regular diet;  Lifestyle:  no children;  Exercise:  walking for approximately 45 minutes 2 days per week;  Occupation:  retired;  Residence:  lives with husband;   ____________________________ REVIEW OF SYSTEMS General:  denies recent weight change, fatique or change in exercise tolerance.  Integumentary:no rashes or new skin lesions. Eyes: wears eye glasses/contact lenses Ears, Nose, Throat, Mouth:  denies any hearing loss, epistaxis, hoarseness or difficulty speaking. Respiratory: cough, dyspnea with exertion, snoring Cardiovascular:  please review HPI Abdominal: history of GERDGenitourinary-Female: no  dysuria, urgency, frequency, UTIs, or stress incontinence Musculoskeletal:  denies arthritis, venous insufficiency, or muscle weakness Neurological:  denies headaches, stroke, or TIA Psychiatric:  denies depression or anxiety Hematological/Immunologic:  denies any food allergies, bleeding disorders. ____________________________ PHYSICAL EXAMINATION VITAL SIGNS  Blood Pressure:  142/80 Sitting, Left arm, regular cuff  , 146/80 Standing, Left arm and regular cuff   Pulse:  92/min. Weight:  164.00 lbs. Height:  68"BMI: 25  Constitutional:  pleasant African American female in no acute distress Skin:  warm and dry to touch, no apparent skin lesions, or masses noted. Head:  normocephalic, normal hair pattern, no masses or tenderness Eyes:  EOMS Intact, PERRLA, C and S clear, Funduscopic exam not done. ENT:  ears, nose and throat reveal no gross abnormalities.  Dentition good. Neck:  supple, without massess. No JVD, thyromegaly or carotid bruits. Carotid upstroke normal. Chest:  normal symmetry, clear to auscultation. Cardiac:  regular rhythm, normal S1 and S2, No S3 or S4, no murmurs, gallops or rubs detected. Abdomen:  abdomen soft,non-tender, no masses, no hepatospenomegaly, or aneurysm noted Peripheral Pulses:  the femoral,dorsalis pedis, and posterior tibial pulses are full and equal bilaterally with no bruits auscultated. Extremities & Back:  no deformities, clubbing, cyanosis, erythema or edema observed. Normal muscle strength and tone. Neurological:  no gross motor or sensory deficits noted, affect appropriate, oriented x3. ____________________________ IMPRESSIONS/PLAN  1. Cardiomegaly on x-ray 2. Cough-induced syncope that sounds mechanical related to her asthma 3. Persistent asthma 4. Hyperlipidemia under treatment 5. Type 2 diabetes without complications 6. Prior history of hypertension  Recommendations:  Husband notes that she snores quite heavily. I would recommend that she have  a sleep study and also that she have an echocardiogram to evaluate for the cardiomegaly. Following the above we may do an exercise treadmill test for exercise treadmill prescription. Recent labs reviewed. EKG is normal.  ____________________________ TODAYS ORDERS  1. 2D, color flow, doppler: [facility]  2. Sleep Study with c-pap: [facility]  3. 12 Lead EKG: Today                       ____________________________ Cardiology Physician:  Kerry Hough MD [facility]

## 2015-01-03 ENCOUNTER — Other Ambulatory Visit: Payer: Self-pay

## 2015-01-03 NOTE — Telephone Encounter (Signed)
Patient needs testing strips sent to [facility] on [facility]

## 2015-01-04 MED ORDER — GLUCOSE BLOOD VI STRP: ORAL_STRIP | Status: DC

## 2015-01-04 NOTE — Telephone Encounter (Signed)
Pt called again requesting diabetic test strips to be sent to [facility] on [address].

## 2015-01-04 NOTE — Telephone Encounter (Signed)
Sent in Rx and notified pt. 

## 2015-01-09 ENCOUNTER — Encounter: Payer: Self-pay | Admitting: Family Medicine

## 2015-01-17 ENCOUNTER — Other Ambulatory Visit: Payer: Self-pay

## 2015-01-17 DIAGNOSIS — E119 Type 2 diabetes mellitus without complications: Secondary | ICD-10-CM

## 2015-01-17 MED ORDER — GLIPIZIDE 5 MG PO TABS: 5.0000 mg | ORAL_TABLET | Freq: Every day | ORAL | Status: DC

## 2015-01-29 ENCOUNTER — Telehealth: Payer: Self-pay

## 2015-01-29 DIAGNOSIS — E119 Type 2 diabetes mellitus without complications: Secondary | ICD-10-CM

## 2015-01-29 DIAGNOSIS — E785 Hyperlipidemia, unspecified: Secondary | ICD-10-CM

## 2015-01-29 DIAGNOSIS — D649 Anemia, unspecified: Secondary | ICD-10-CM

## 2015-01-29 NOTE — Telephone Encounter (Signed)
Pt stopped by for labs only because Dr. Lillie Fragmin last letter that was sent to her said to RTC in 3 months to recheck labs. Pt called and was told by telephone that she would not have to see a doctor. Advised pt that I was unable to draw her blood because I didn't have any orders, and that she would probably have to see the doctor.  Dr Lorelei Pont, can pt have labs only or do you need to recheck her?

## 2015-01-30 NOTE — Telephone Encounter (Signed)
Called her- I will place orders as it is not a great time for her to try and be seen.  She will come in for BW tomorrow

## 2015-02-01 ENCOUNTER — Other Ambulatory Visit (INDEPENDENT_AMBULATORY_CARE_PROVIDER_SITE_OTHER): Payer: Commercial Managed Care - HMO | Admitting: Family Medicine

## 2015-02-01 DIAGNOSIS — D649 Anemia, unspecified: Secondary | ICD-10-CM | POA: Diagnosis not present

## 2015-02-01 DIAGNOSIS — G4730 Sleep apnea, unspecified: Secondary | ICD-10-CM

## 2015-02-01 DIAGNOSIS — E785 Hyperlipidemia, unspecified: Secondary | ICD-10-CM

## 2015-02-01 DIAGNOSIS — E119 Type 2 diabetes mellitus without complications: Secondary | ICD-10-CM

## 2015-02-01 LAB — CBC
HEMATOCRIT: 34.7 % — AB (ref 36.0–46.0)
Hemoglobin: 11.2 g/dL — ABNORMAL LOW (ref 12.0–15.0)
MCH: 25.9 pg — ABNORMAL LOW (ref 26.0–34.0)
MCHC: 32.3 g/dL (ref 30.0–36.0)
MCV: 80.3 fL (ref 78.0–100.0)
MPV: 11.2 fL (ref 8.6–12.4)
PLATELETS: 317 10*3/uL (ref 150–400)
RBC: 4.32 MIL/uL (ref 3.87–5.11)
RDW: 14.9 % (ref 11.5–15.5)
WBC: 8.5 10*3/uL (ref 4.0–10.5)

## 2015-02-01 LAB — BASIC METABOLIC PANEL
BUN: 17 mg/dL (ref 7–25)
CHLORIDE: 103 mmol/L (ref 98–110)
CO2: 28 mmol/L (ref 20–31)
Calcium: 9.8 mg/dL (ref 8.6–10.4)
Creat: 1.32 mg/dL — ABNORMAL HIGH (ref 0.50–1.05)
Glucose, Bld: 80 mg/dL (ref 65–99)
POTASSIUM: 4.6 mmol/L (ref 3.5–5.3)
SODIUM: 141 mmol/L (ref 135–146)

## 2015-02-01 LAB — LIPID PANEL
CHOLESTEROL: 201 mg/dL — AB (ref 125–200)
HDL: 46 mg/dL (ref >=46)
LDL CALC: 134 mg/dL — AB (ref <130)
Total CHOL/HDL Ratio: 4.4 Ratio (ref <=5.0)
Triglycerides: 104 mg/dL (ref <150)
VLDL: 21 mg/dL (ref <30)

## 2015-02-01 LAB — HEMOGLOBIN A1C
Hgb A1c MFr Bld: 6.1 % — ABNORMAL HIGH (ref <5.7)
Mean Plasma Glucose: 128 mg/dL — ABNORMAL HIGH (ref <117)

## 2015-02-04 ENCOUNTER — Telehealth: Payer: Self-pay | Admitting: Family Medicine

## 2015-02-04 DIAGNOSIS — N189 Chronic kidney disease, unspecified: Secondary | ICD-10-CM

## 2015-02-04 NOTE — Telephone Encounter (Signed)
Received her follow-up labs  Results for orders placed or performed in visit on 02/01/15  CBC  Result Value Ref Range   WBC 8.5 4.0 - 10.5 K/uL   RBC 4.32 3.87 - 5.11 MIL/uL   Hemoglobin 11.2 (L) 12.0 - 15.0 g/dL   HCT 34.7 (L) 36.0 - 46.0 %   MCV 80.3 78.0 - 100.0 fL   MCH 25.9 (L) 26.0 - 34.0 pg   MCHC 32.3 30.0 - 36.0 g/dL   RDW 14.9 11.5 - 15.5 %   Platelets 317 150 - 400 K/uL   MPV 11.2 8.6 - 12.4 fL  Basic metabolic panel  Result Value Ref Range   Sodium 141 135 - 146 mmol/L   Potassium 4.6 3.5 - 5.3 mmol/L   Chloride 103 98 - 110 mmol/L   CO2 28 20 - 31 mmol/L   Glucose, Bld 80 65 - 99 mg/dL   BUN 17 7 - 25 mg/dL   Creat 1.32 (H) 0.50 - 1.05 mg/dL   Calcium 9.8 8.6 - 10.4 mg/dL  Lipid panel  Result Value Ref Range   Cholesterol 201 (H) 125 - 200 mg/dL   Triglycerides 104 <150 mg/dL   HDL 46 >=46 mg/dL   Total CHOL/HDL Ratio 4.4 <=5.0 Ratio   VLDL 21 <30 mg/dL   LDL Cholesterol 134 (H) <130 mg/dL  Hemoglobin A1c  Result Value Ref Range   Hgb A1c MFr Bld 6.1 (H) <5.7 %   Mean Plasma Glucose 128 (H) <117 mg/dL   GFR is now 46- she is just barely ok to continue metformin.  Do not see referral to nephrology under referrals- will place this again.  Left detailed message on machine- see letter from today for details   BP Readings from Last 3 Encounters:  11/24/14 126/74  11/20/14 138/74  10/29/14 108/68

## 2015-02-11 ENCOUNTER — Ambulatory Visit (HOSPITAL_BASED_OUTPATIENT_CLINIC_OR_DEPARTMENT_OTHER): Payer: Commercial Managed Care - HMO | Attending: Cardiology

## 2015-02-11 VITALS — Ht 69.0 in | Wt 161.0 lb

## 2015-02-11 DIAGNOSIS — G4733 Obstructive sleep apnea (adult) (pediatric): Secondary | ICD-10-CM | POA: Diagnosis present

## 2015-02-11 DIAGNOSIS — R0683 Snoring: Secondary | ICD-10-CM | POA: Diagnosis not present

## 2015-02-11 DIAGNOSIS — Z9989 Dependence on other enabling machines and devices: Secondary | ICD-10-CM

## 2015-02-16 DIAGNOSIS — G4733 Obstructive sleep apnea (adult) (pediatric): Secondary | ICD-10-CM | POA: Diagnosis not present

## 2015-02-16 NOTE — Progress Notes (Signed)
Patient Name: Ashley Villarreal, Ashley Villarreal Date: 02/11/2015 Gender: Female D.O.B: 28-Nov-1955 Age (years): 58 Referring Provider: Lou Miner Height (inches): 69 Interpreting Physician: Baird Lyons MD, ABSM Weight (lbs): 161 RPSGT: Jonna Coup BMI: 24 MRN: [id number] Neck Size: 14.50 CLINICAL INFORMATION Sleep Study Type: Split Night CPAP     Indication for sleep study: N/A     Epworth Sleepiness Score:3/24  SLEEP STUDY TECHNIQUE As per the AASM Manual for the Scoring of Sleep and Associated Events v2.3 (April 2016) with a hypopnea requiring 4% desaturations.  The channels recorded and monitored were frontal, central and occipital EEG, electrooculogram (EOG), submentalis EMG (chin), nasal and oral airflow, thoracic and abdominal wall motion, anterior tibialis EMG, snore microphone, electrocardiogram, and pulse oximetry. Continuous positive airway pressure (CPAP) was initiated when the patient met split night criteria and was titrated according to treat sleep-disordered breathing.  MEDICATIONS Medications taken by the patient charted for review Medications administered by patient during sleep study : Sleep medicine administered - None  RESPIRATORY PARAMETERS Diagnostic  Total AHI (/hr): 12.5 RDI (/hr): 12.5 OA Index (/hr): 0.8 CA Index (/hr): 0.0 REM AHI (/hr): 26.7 NREM AHI (/hr): 9.5 Supine AHI (/hr): 5.0 Non-supine AHI (/hr): 13.95 Min O2 Sat (%): 82.00 Mean O2 (%): 91.57 Time below 88% (min): 2.2    Titration Optimal Pressure (cm): 9 AHI at Optimal Pressure (/hr): 0.0 Min O2 at Optimal Pressure (%): 92.0 Supine % at Optimal (%): 35 Sleep % at Optimal (%): 46    SLEEP ARCHITECTURE The recording time for the entire night was 371.3 minutes.  During a baseline period of 161.2 minutes, the patient slept for 76.5 minutes in REM and nonREM, yielding a sleep efficiency of 47.5%. Sleep onset after lights out was 42.6 minutes with a REM latency of 80.5 minutes. The patient  spent 2.61% of the night in stage N1 sleep, 47.71% in stage N2 sleep, 32.03% in stage N3 and 17.65% in REM.  During the titration period of 207.4 minutes, the patient slept for 162.0 minutes in REM and nonREM, yielding a sleep efficiency of 78.1%. Sleep onset after CPAP initiation was 7.7 minutes with a REM latency of 56.0 minutes. The patient spent 2.78% of the night in stage N1 sleep, 63.58% in stage N2 sleep, 12.35% in stage N3 and 21.30% in REM.  CARDIAC DATA The 2 lead EKG demonstrated sinus rhythm. The mean heart rate was 85.23 beats per minute. Other EKG findings include: None.  LEG MOVEMENT DATA The total Periodic Limb Movements of Sleep (PLMS) were 1. The PLMS index was 0.25 .  IMPRESSIONS Mild obstructive sleep apnea occurred during the diagnostic portion of the study (AHI = 12.5 /hour). An optimal PAP pressure was selected for this patient ( 9 cm of water) No significant central sleep apnea occurred during the diagnostic portion of the study (CAI = 0.0/hour). Severe oxygen desaturation was noted during the diagnostic portion of the study (Min O2 = 82.00%). The patient snored with Soft snoring volume during the diagnostic portion of the study. No cardiac abnormalities were noted during this study. Clinically significant periodic limb movements did not occur during sleep.  DIAGNOSIS Obstructive Sleep Apnea (327.23 [G47.33 ICD-10])  RECOMMENDATIONS Trial of CPAP therapy on 9 cm H2O with a Standard size Fisher&Paykel Nasal Pillow Mask [place] Q mask and heated humidification. Avoid alcohol, sedatives and other CNS depressants that may worsen sleep apnea and disrupt normal sleep architecture. Sleep hygiene should be reviewed to assess factors that may improve sleep quality. Weight management and  regular exercise should be initiated or continued. Return to Sleep Center for re-evaluation after 4 weeks of therapy  [place], American Board of Sleep  Medicine  ELECTRONICALLY SIGNED ON:  02/16/2015, 1:22 PM [place] PH: [phone]   FX: [phone] [place]

## 2015-02-23 DIAGNOSIS — K219 Gastro-esophageal reflux disease without esophagitis: Secondary | ICD-10-CM | POA: Insufficient documentation

## 2015-03-01 ENCOUNTER — Other Ambulatory Visit: Payer: Self-pay

## 2015-03-01 MED ORDER — OMALIZUMAB 150 MG ~~LOC~~ SOLR
150.0000 mg | SUBCUTANEOUS | Status: DC
  Administered 2015-03-21 – 2015-10-31 (×9): 150 mg via SUBCUTANEOUS

## 2015-03-11 LAB — HM DIABETES EYE EXAM

## 2015-03-13 DIAGNOSIS — G4733 Obstructive sleep apnea (adult) (pediatric): Secondary | ICD-10-CM | POA: Insufficient documentation

## 2015-03-21 ENCOUNTER — Ambulatory Visit (INDEPENDENT_AMBULATORY_CARE_PROVIDER_SITE_OTHER): Payer: Commercial Managed Care - HMO

## 2015-03-21 DIAGNOSIS — J4540 Moderate persistent asthma, uncomplicated: Secondary | ICD-10-CM

## 2015-03-21 DIAGNOSIS — J45901 Unspecified asthma with (acute) exacerbation: Secondary | ICD-10-CM

## 2015-03-22 ENCOUNTER — Encounter: Payer: Self-pay | Admitting: Family Medicine

## 2015-04-11 ENCOUNTER — Other Ambulatory Visit: Payer: Self-pay | Admitting: Family Medicine

## 2015-04-18 ENCOUNTER — Other Ambulatory Visit: Payer: Self-pay

## 2015-04-18 ENCOUNTER — Ambulatory Visit (INDEPENDENT_AMBULATORY_CARE_PROVIDER_SITE_OTHER): Payer: Commercial Managed Care - HMO | Admitting: Neurology

## 2015-04-18 DIAGNOSIS — Z1231 Encounter for screening mammogram for malignant neoplasm of breast: Secondary | ICD-10-CM

## 2015-04-18 DIAGNOSIS — J4540 Moderate persistent asthma, uncomplicated: Secondary | ICD-10-CM | POA: Diagnosis not present

## 2015-04-18 DIAGNOSIS — J45901 Unspecified asthma with (acute) exacerbation: Secondary | ICD-10-CM

## 2015-04-30 ENCOUNTER — Encounter: Payer: Self-pay | Admitting: Cardiology

## 2015-04-30 NOTE — Progress Notes (Signed)
Patient ID: Ashley Villarreal, female   DOB: March 13, 1956, 59 y.o.   MRN: [id number]   Ashley Villarreal, Ashley Villarreal    Date of visit:  04/30/2015 DOB:  1956-01-14    Age:  59 yrs. Medical record number:  [id number]     Account number:  [id number] Primary Care Provider: FYBOFB,PZWCH ____________________________ CURRENT DIAGNOSES  1. Sleep apnea  2. Hypertensive heart disease without heart failure  3. Cardiomegaly  4. Asthma  5. Hyperlipidemia  6. Type 2 diabetes mellitus without complications ____________________________ ALLERGIES  Claritin, Intolerance-dizziness  Doxycycline, Intolerance-unknown ____________________________ MEDICATIONS  1. Symbicort 160 mcg-4.5 mcg/actuation HFA aerosol inhaler, 2 puff bid  2. EpiPen 0.3 mg/0.3 mL injection, auto-injector, Take as directed  3. glipizide 5 mg tablet, 1 p.o. daily  4. ipratropium-albuterol 0.5 mg-3 mg(2.5 mg base)/3 mL nebulization soln, PRN  5. Xopenex HFA 45 mcg/actuation aerosol inhaler, PRN  6. losartan 100 mg tablet, 1 p.o. daily  7. metformin 500 mg tablet, BID  8. Zegerid 40 mg-1.1 gram capsule, 1 p.o. daily  9. prednisone 20 mg tablet, Take as directed  10. simvastatin 20 mg tablet, 1 p.o. daily ____________________________ HISTORY OF PRESENT ILLNESS Patient seen for cardiac followup. She was seen earlier in the year with hypertension and cardiomegaly. An echocardiogram showed concentric LVH with good systolic function. She had a sleep study that showed sleep apnea and was placed on CPAP. She has not had any followup since then the sleep Center is giving a hard time about coming back there. She reports improvement in her energy level and overall sense of well-being since being treated for sleep apnea. She had some vague chest symptoms have all improved since she has been wearing CPAP. ontrol. BP is running somewhat elevated. ____________________________ PAST HISTORY  Past Medical Illnesses:  asthma, DM-non-insulin dependent, hyperlipidemia,  hypertension, GERD, sleep apnea;  Cardiovascular Illnesses:  no previous history of cardiac disease.;  Surgical Procedures:  tonsillectomy, nose surgery;  NYHA Classification:  I;  Canadian Angina Classification:  Class 0: Asymptomatic;  Cardiology Procedures-Invasive:  no history of prior cardiac procedures;  Cardiology Procedures-Noninvasive:  echocardiogram July 2016;  LVEF of % documented via echocardiogram on 12/31/2014,   ____________________________ CARDIO-PULMONARY TEST DATES EKG Date:  12/20/2014;  Chest Xray Date: 11/24/2014;   ____________________________ SOCIAL HISTORY Alcohol Use:  no alcohol use;  Smoking:  never smoked;  Diet:  regular diet;  Lifestyle:  no children;  Exercise:  walking for approximately 45 minutes 2 days per week;  Occupation:  retired;  Residence:  lives with husband;   ____________________________ PHYSICAL EXAMINATION VITAL SIGNS  Blood Pressure:  150/80 Sitting, Right arm, regular cuff   Pulse:  84/min. Weight:  165.00 lbs. Height:  68"BMI: 25  Constitutional:  pleasant African American female in no acute distress Skin:  warm and dry to touch, no apparent skin lesions, or masses noted. Head:  normocephalic, normal hair pattern, no masses or tenderness Chest:  normal symmetry, clear to auscultation. Cardiac:  regular rhythm, normal S1 and S2, No S3 or S4, no murmurs, gallops or rubs detected. Peripheral Pulses:  the femoral,dorsalis pedis, and posterior tibial pulses are full and equal bilaterally with no bruits auscultated. Extremities & Back:  no deformities, clubbing, cyanosis, erythema or edema observed. Normal muscle strength and tone. Neurological:  no gross motor or sensory deficits noted, affect appropriate, oriented x3. ____________________________ IMPRESSIONS/PLAN 1. Hypertensive heart disease 2. Sleep apnea under treatment 3. Hyperlipidemia 4. Diabetes under treatment  Recommendations:  She will have a treadmill test because of  her risk  factors and help with exercise prescription. Recommended referral to a sleep specialist to help manage her sleep apnea. ____________________________ TODAYS ORDERS  1. Pulmonary Consult- sleep specialist: Schedule ASAP  2. treadmill:  Regular TM At [facility]                       ____________________________ Cardiology Physician:  Kerry Hough. MD [facility]

## 2015-05-01 ENCOUNTER — Ambulatory Visit (INDEPENDENT_AMBULATORY_CARE_PROVIDER_SITE_OTHER): Payer: Commercial Managed Care - HMO | Admitting: Family Medicine

## 2015-05-01 ENCOUNTER — Telehealth: Payer: Self-pay

## 2015-05-01 ENCOUNTER — Encounter: Payer: Self-pay | Admitting: Family Medicine

## 2015-05-01 VITALS — BP 140/60 | HR 86 | Temp 98.1°F | Resp 16 | Ht 67.25 in | Wt 159.8 lb

## 2015-05-01 DIAGNOSIS — E1129 Type 2 diabetes mellitus with other diabetic kidney complication: Secondary | ICD-10-CM

## 2015-05-01 DIAGNOSIS — I10 Essential (primary) hypertension: Secondary | ICD-10-CM | POA: Diagnosis not present

## 2015-05-01 DIAGNOSIS — R748 Abnormal levels of other serum enzymes: Secondary | ICD-10-CM

## 2015-05-01 DIAGNOSIS — Z119 Encounter for screening for infectious and parasitic diseases, unspecified: Secondary | ICD-10-CM

## 2015-05-01 DIAGNOSIS — N289 Disorder of kidney and ureter, unspecified: Secondary | ICD-10-CM

## 2015-05-01 DIAGNOSIS — Z23 Encounter for immunization: Secondary | ICD-10-CM

## 2015-05-01 DIAGNOSIS — R7989 Other specified abnormal findings of blood chemistry: Secondary | ICD-10-CM | POA: Insufficient documentation

## 2015-05-01 LAB — BASIC METABOLIC PANEL
BUN: 23 mg/dL (ref 7–25)
CO2: 27 mmol/L (ref 20–31)
Calcium: 9.7 mg/dL (ref 8.6–10.4)
Chloride: 103 mmol/L (ref 98–110)
Creat: 1.25 mg/dL — ABNORMAL HIGH (ref 0.50–1.05)
Glucose, Bld: 93 mg/dL (ref 65–99)
POTASSIUM: 4.3 mmol/L (ref 3.5–5.3)
SODIUM: 142 mmol/L (ref 135–146)

## 2015-05-01 LAB — HEMOGLOBIN A1C
HEMOGLOBIN A1C: 6.2 % — AB (ref <5.7)
MEAN PLASMA GLUCOSE: 131 mg/dL — AB (ref <117)

## 2015-05-01 NOTE — Telephone Encounter (Signed)
Dr. Wynonia Lawman calling to speak to [facility] about a sleep study that he read for this patient.  Dr. Wynonia Lawman states this is not urgent but would like to discuss directly with CY.   Dr. Wynonia Lawman can be reached at [phone].  CY please advise.  Thanks!

## 2015-05-01 NOTE — Progress Notes (Addendum)
[facility] [address], [place] [address] [phone]  Date:  05/01/2015   Name:  Ashley Villarreal   DOB:  August 25, 1955   MRN:  [id number]  PCP:  Kennon Portela, MD    Chief Complaint: Follow-up and Bloodwork, kidney functions   History of Present Illness:  Ashley Villarreal is a 59 y.o. very pleasant female patient who presents with the following:  Here today to recheck labs- at her last visit her creat was running high.   She has heard from nephrology, but has not yet had an appt made; she is low priority as her disease is not severe.  She did stop her chlorthalidone as we discussed- she has not noted any adverse effects to stopping this medication.  She avoids salt and this helps her avoid any swelling. She is keeping an eye on her BP at home and it generally runs less than 140/90  Flu shot done today  She was also dx with OSA not long ago- she is using CPAP now. This does seem to be helping her overall health and she is feeling quite well  Wt Readings from Last 3 Encounters:  05/01/15 159 lb 12.8 oz (72.485 kg)  02/11/15 161 lb (73.029 kg)  11/24/14 159 lb (72.122 kg)     BP Readings from Last 3 Encounters:  05/01/15 140/60  12/14/14 138/78  11/24/14 126/74   Lab Results  Component Value Date   HGBA1C 6.1* 02/01/2015     Patient Active Problem List   Diagnosis Date Noted  . Sleep apnea in adult   . Laryngopharyngeal reflux 02/23/2015  . Hyperlipemia 08/10/2011  . Asthma with acute exacerbation 08/10/2011  . Hypertensive heart disease   . Diabetes mellitus type 2, noninsulin dependent [facility])     Past Medical History  Diagnosis Date  . Allergy   . Diabetes mellitus without complication ([place])   . Asthma   . Hypertension   . COPD (chronic obstructive pulmonary disease) [facility])     Past Surgical History  Procedure Laterality Date  . Tonsillectomy  1975    Social History  Substance Use Topics  . Smoking status: Never Smoker    . Smokeless tobacco: None  . Alcohol Use: No    Family History  Problem Relation Age of Onset  . Stroke Father   . Heart disease Father   . Diabetes Mother   . Heart disease Mother   . Stroke Mother   . Hyperlipidemia Sister   . Hyperlipidemia Brother   . Diabetes Paternal Grandmother   . Hypertension Brother     Allergies  Allergen Reactions  . Claritin [Loratadine]     dizziness  . Doxycycline Nausea Only  . Other     Magic mouthwash    Medication list has been reviewed and updated.  Current Outpatient Prescriptions on File Prior to Visit  Medication Sig Dispense Refill  . beclomethasone (QVAR) 80 MCG/ACT inhaler Inhale 2 puffs into the lungs 2 (two) times daily.    Marland Kitchen EPINEPHrine (EPIPEN 2-PAK) 0.3 mg/0.3 mL IJ SOAJ injection Inject 0.3 mLs (0.3 mg total) into the muscle once. 2 Device 1  . EPINEPHrine (EPIPEN) 0.3 mg/0.3 mL DEVI Inject 0.3 mLs (0.3 mg total) into the muscle once. 2 Device 3  . fluticasone-salmeterol (ADVAIR HFA) 230-21 MCG/ACT inhaler Inhale 2 puffs into the lungs 2 (two) times daily.    Marland Kitchen glipiZIDE (GLUCOTROL) 5 MG tablet TAKE 1 TABLET (5 MG TOTAL) BY  MOUTH DAILY BEFORE BREAKFAST 90 tablet 0  . glucose blood (ONE TOUCH ULTRA TEST) test strip Please check blood sugar daily. Dx code: E11.9 100 each 3  . levalbuterol (XOPENEX HFA) 45 MCG/ACT inhaler Inhale 1-2 puffs into the lungs every 4 (four) hours as needed. 3 Inhaler 3  . losartan (COZAAR) 100 MG tablet TAKE 1 TABLET (100 MG TOTAL) BY MOUTH DAILY. 90 tablet 3  . metFORMIN (GLUCOPHAGE) 500 MG tablet TAKE 1 TABLET (500 MG TOTAL) BY MOUTH 2 (TWO) TIMES DAILY WITH A MEAL. 180 tablet 3  . simvastatin (ZOCOR) 20 MG tablet Take one daily for cholesterol 90 tablet 3  . budesonide-formoterol (SYMBICORT) 160-4.5 MCG/ACT inhaler Inhale 2 puffs into the lungs 2 (two) times daily. (Patient not taking: Reported on 05/01/2015) 3 Inhaler 3  . chlorthalidone (HYGROTON) 25 MG tablet Take 1 tablet (25 mg total) by mouth  daily. (Patient not taking: Reported on 05/01/2015) 90 tablet 3  . ipratropium-albuterol (DUONEB) 0.5-2.5 (3) MG/3ML SOLN Take 3 mLs by nebulization every 4 (four) hours as needed. (Patient not taking: Reported on 05/01/2015) 120 mL 2  . omeprazole-sodium bicarbonate (ZEGERID) 40-1100 MG per capsule Take 1 capsule by mouth daily before breakfast. (Patient not taking: Reported on 05/01/2015) 90 capsule 3   Current Facility-Administered Medications on File Prior to Visit  Medication Dose Route Frequency Provider Last Rate Last Dose  . omalizumab Arvid Right) injection 150 mg  150 mg Subcutaneous Q28 days Jiles Prows, MD   150 mg at 04/18/15 1019    Review of Systems:  As per HPI- otherwise negative.   Physical Examination: Filed Vitals:   05/01/15 0930  BP: 140/60  Pulse: 86  Temp: 98.1 F (36.7 C)  Resp: 16   Filed Vitals:   05/01/15 0930  Height: 5' 7.25" (1.708 m)  Weight: 159 lb 12.8 oz (72.485 kg)   Body mass index is 24.85 kg/(m^2). Ideal Body Weight: Weight in (lb) to have BMI = 25: 160.5  GEN: WDWN, NAD, Non-toxic, A & O x 3, looks well HEENT: Atraumatic, Normocephalic. Neck supple. No masses, No LAD. Ears and Nose: No external deformity. CV: RRR, No M/G/R. No JVD. No thrill. No extra heart sounds. PULM: CTA B, no wheezes, crackles, rhonchi. No retractions. No resp. distress. No accessory muscle use. EXTR: No c/c/e- absolutely no edema NEURO Normal gait.  PSYCH: Normally interactive. Conversant. Not depressed or anxious appearing.  Calm demeanor.    Assessment and Plan: Renal insufficiency - Plan: Basic metabolic panel  Flu vaccine need - Plan: Flu Vaccine QUAD 36+ mos IM  Controlled type 2 diabetes mellitus with other diabetic kidney complication ([place]) - Plan: Hemoglobin A1c  Screening examination for infectious disease - Plan: Hepatitis C antibody  Essential hypertension  Check her BMP and A1c today BP is acceptable without her diuretic which we stopped due to  elevated creat Hep C screening Flu shot today Follow-up with her pending labs Nephrology referral is pending   Signed Lamar Blinks, MD  Received her labs 11/10- GFR is 55 so she may continue her metformin  Results for orders placed or performed in visit on 35/70/17  Basic metabolic panel  Result Value Ref Range   Sodium 142 135 - 146 mmol/L   Potassium 4.3 3.5 - 5.3 mmol/L   Chloride 103 98 - 110 mmol/L   CO2 27 20 - 31 mmol/L   Glucose, Bld 93 65 - 99 mg/dL   BUN 23 7 - 25 mg/dL   Creat 1.25 (  H) 0.50 - 1.05 mg/dL   Calcium 9.7 8.6 - 10.4 mg/dL  Hemoglobin A1c  Result Value Ref Range   Hgb A1c MFr Bld 6.2 (H) <5.7 %   Mean Plasma Glucose 131 (H) <117 mg/dL  Hepatitis C antibody  Result Value Ref Range   HCV Ab NEGATIVE NEGATIVE   Her GFR is

## 2015-05-01 NOTE — Patient Instructions (Signed)
It was great to see you today!   I will be in touch with your labs asap Assuming that your kidney function looks good and that your A1c is still controlled, we could consider stopping your glipizide Continue to keep an eye on your BP at home; if you start running higher than 140/90 on a regular basis please let me know

## 2015-05-02 ENCOUNTER — Encounter: Payer: Self-pay | Admitting: Family Medicine

## 2015-05-02 LAB — HEPATITIS C ANTIBODY: HCV Ab: NEGATIVE

## 2015-05-03 ENCOUNTER — Telehealth: Payer: Self-pay | Admitting: Internal Medicine

## 2015-05-03 NOTE — Telephone Encounter (Signed)
Opened this message in error.

## 2015-05-03 NOTE — Telephone Encounter (Signed)
I spoke with Dr Wynonia Lawman. I asked Katie to find a sleep doc new spot for this patient who had sleep study at [facility]

## 2015-05-03 NOTE — Telephone Encounter (Signed)
LMTCB-pt to ask for Eul[facility] Walkup. Thanks.

## 2015-05-03 NOTE — Telephone Encounter (Signed)
Spoke with pt. Was told that after 4 weeks starting her CPAP to return to the sleep center. She wanted to make sure she needed to do this. Advised her that I had never heard of this before. States she's not having any issues with her CPAP and there's no reason to go back there. Nothing further was needed.

## 2015-05-03 NOTE — Telephone Encounter (Signed)
Pt returned my call-she is able to come in on Monday 05/27/15 at 11:15am. Pt is aware of where our office is located as well. Nothing more needed at this time.

## 2015-05-06 ENCOUNTER — Other Ambulatory Visit: Payer: Self-pay | Admitting: Neurology

## 2015-05-06 MED ORDER — BECLOMETHASONE DIPROPIONATE 80 MCG/ACT IN AERS: 2.0000 | INHALATION_SPRAY | Freq: Two times a day (BID) | RESPIRATORY_TRACT | Status: DC

## 2015-05-06 NOTE — Telephone Encounter (Signed)
Spoke with patient last week and she has been scheduled with CY for sleep consult. Nothing more is needed at this time.Thanks.

## 2015-05-20 ENCOUNTER — Ambulatory Visit (INDEPENDENT_AMBULATORY_CARE_PROVIDER_SITE_OTHER): Payer: Commercial Managed Care - HMO

## 2015-05-20 ENCOUNTER — Ambulatory Visit
Admission: RE | Admit: 2015-05-20 | Discharge: 2015-05-20 | Disposition: A | Discharge status: Home / Self Care | Payer: Commercial Managed Care - HMO | Source: Ambulatory Visit

## 2015-05-20 DIAGNOSIS — J4540 Moderate persistent asthma, uncomplicated: Secondary | ICD-10-CM

## 2015-05-20 DIAGNOSIS — Z1231 Encounter for screening mammogram for malignant neoplasm of breast: Secondary | ICD-10-CM

## 2015-05-27 ENCOUNTER — Encounter: Payer: Self-pay | Admitting: Internal Medicine

## 2015-05-27 ENCOUNTER — Other Ambulatory Visit: Payer: Self-pay | Admitting: Neurology

## 2015-05-27 ENCOUNTER — Ambulatory Visit (INDEPENDENT_AMBULATORY_CARE_PROVIDER_SITE_OTHER): Payer: Commercial Managed Care - HMO | Admitting: Internal Medicine

## 2015-05-27 VITALS — BP 128/72 | HR 86 | Ht 69.0 in | Wt 169.0 lb

## 2015-05-27 DIAGNOSIS — G4733 Obstructive sleep apnea (adult) (pediatric): Secondary | ICD-10-CM | POA: Diagnosis not present

## 2015-05-27 DIAGNOSIS — I119 Hypertensive heart disease without heart failure: Secondary | ICD-10-CM | POA: Diagnosis not present

## 2015-05-27 MED ORDER — FLUTICASONE-SALMETEROL 230-21 MCG/ACT IN AERO: 2.0000 | INHALATION_SPRAY | Freq: Two times a day (BID) | RESPIRATORY_TRACT | Status: DC

## 2015-05-27 NOTE — Patient Instructions (Signed)
Order-DME [facility]-continue CPAP 9 CWP, mask of choice, humidifier, supplies, please add Air View  DX OSA  Please call if we can help

## 2015-05-27 NOTE — Progress Notes (Signed)
05/27/2015-59 year old female never smoker for sleep medicine evaluation. Pt referred by Dr. Wynonia Lawman for Sleep Apnea.  Sleep study completed in September.  pt states she had some loud snoring and waking very restless. pt currently using CPAP for about 2 months now and it is working great for pt. Epworth Score: 8 DME: CPAP 9/Advanced NPSG 02/11/15- AHI 12.5 per hour, CPAP to 9  Witnessed loud snoring and apneas described by her husband. Usual bedtime between 10 and 11 PM with latency 10-15 minutes, up once or twice before getting up 8:30 AM. ENT surgery-tonsils, sinus surgery Complicating medical problems including hypertension. History of asthma controlled with inhalers and Xolair injections (Dr. Neldon Mc) She reports being extremely pleased with her initial time on CPAP. She has no complaints and is able to wear it all night every night. Snoring was prevented and sleep quality is better.  Prior to Admission medications   Medication Sig Start Date End Date Taking? Authorizing Provider  beclomethasone (QVAR) 80 MCG/ACT inhaler Inhale 2 puffs into the lungs 2 (two) times daily. 05/06/15  Yes Jiles Prows, MD  EPINEPHrine (EPIPEN 2-PAK) 0.3 mg/0.3 mL IJ SOAJ injection Inject 0.3 mLs (0.3 mg total) into the muscle once. 10/29/14  Yes Gay Filler Copland, MD  fluticasone-salmeterol (ADVAIR HFA) 230-21 MCG/ACT inhaler Inhale 2 puffs into the lungs 2 (two) times daily. 05/27/15  Yes Jiles Prows, MD  glipiZIDE (GLUCOTROL) 5 MG tablet TAKE 1 TABLET (5 MG TOTAL) BY MOUTH DAILY BEFORE BREAKFAST 04/11/15  Yes Jessica C Copland, MD  glucose blood (ONE TOUCH ULTRA TEST) test strip Please check blood sugar daily. Dx code: E11.9 01/04/15  Yes Chelle Jeffery, PA-C  ipratropium-albuterol (DUONEB) 0.5-2.5 (3) MG/3ML SOLN Take 3 mLs by nebulization every 4 (four) hours as needed. 11/24/14  Yes Posey Boyer, MD  levalbuterol [facility] HFA) 45 MCG/ACT inhaler Inhale 1-2 puffs into the lungs every 4 (four) hours as needed. 10/29/14   Yes Gay Filler Copland, MD  losartan (COZAAR) 100 MG tablet TAKE 1 TABLET (100 MG TOTAL) BY MOUTH DAILY. 10/29/14  Yes Gay Filler Copland, MD  metFORMIN (GLUCOPHAGE) 500 MG tablet TAKE 1 TABLET (500 MG TOTAL) BY MOUTH 2 (TWO) TIMES DAILY WITH A MEAL. 10/29/14  Yes Gay Filler Copland, MD  omeprazole-sodium bicarbonate (ZEGERID) 40-1100 MG per capsule Take 1 capsule by mouth daily before breakfast. 10/29/14  Yes Gay Filler Copland, MD  simvastatin (ZOCOR) 20 MG tablet Take one daily for cholesterol 10/31/14  Yes Darreld Mclean, MD   Past Medical History  Diagnosis Date  . Allergy   . Diabetes mellitus without complication ([place])   . Asthma   . Hypertension   . COPD (chronic obstructive pulmonary disease) [facility])    Past Surgical History  Procedure Laterality Date  . Tonsillectomy  1975   Family History  Problem Relation Age of Onset  . Stroke Father   . Heart disease Father   . Diabetes Mother   . Heart disease Mother   . Stroke Mother   . Hyperlipidemia Sister   . Hyperlipidemia Brother   . Diabetes Paternal Grandmother   . Hypertension Brother    Social History   Social History  . Marital Status: Married    Spouse Name: N/A  . Number of Children: N/A  . Years of Education: N/A   Occupational History  . Accountant    Social History Main Topics  . Smoking status: Never Smoker   . Smokeless tobacco: Not on file  . Alcohol Use:  No  . Drug Use: No  . Sexual Activity:    Partners: Male    Birth Control/ Protection: Condom   Other Topics Concern  . Not on file   Social History Narrative   Married. Education: college.   ROS-see HPI   Negative unless "+" Constitutional:    weight loss, night sweats, fevers, chills, fatigue, lassitude. HEENT:    headaches, difficulty swallowing, tooth/dental problems, sore throat,       sneezing, itching, ear ache, nasal congestion, post nasal drip, snoring CV:    chest pain, orthopnea, PND, swelling in lower extremities, anasarca,                                                           dizziness, palpitations Resp:   shortness of breath with exertion or at rest.                productive cough,   non-productive cough, coughing up of blood.              change in color of mucus.  wheezing.   Skin:    rash or lesions. GI:  No-   heartburn, indigestion, abdominal pain, nausea, vomiting,  GU: . MS:   joint pain, stiffness, decreased range of motion, back pain. Neuro-     nothing unusual Psych:  change in mood or affect.  depression or anxiety.   memory loss.  OBJ- Physical Exam General- Alert, Oriented, Affect-appropriate, Distress- none acute, normal weight Skin- rash-none, lesions- none, excoriation- none Lymphadenopathy- none Head- atraumatic            Eyes- Gross vision intact, PERRLA, conjunctivae and secretions clear            Ears- Hearing, canals-normal            Nose- Clear, no-Septal dev, mucus, polyps, erosion, perforation             Throat- Mallampati III-IV , mucosa clear , drainage- none, tonsils- atrophic Neck- flexible , trachea midline, no stridor , thyroid nl, carotid no bruit Chest - symmetrical excursion , unlabored           Heart/CV- RRR , no murmur , no gallop  , no rub, nl s1 s2                           - JVD- none , edema- none, stasis changes- none, varices- none           Lung- clear to P&A, wheeze- none, cough- none , dullness-none, rub- none           Chest wall-  Abd-  Br/ Gen/ Rectal- Not done, not indicated Extrem- cyanosis- none, clubbing, none, atrophy- none, strength- nl Neuro- grossly intact to observation

## 2015-05-27 NOTE — Assessment & Plan Note (Signed)
Mild obstructive sleep apnea with appropriate history. Excellent response to initial CPAP trial at 9 CWP/Advanced. We discussed comfort measures and long-term goals. Plan-continue CPAP 9

## 2015-05-27 NOTE — Assessment & Plan Note (Signed)
Discussed interaction between CPAP therapy and hypertensive heart disease

## 2015-05-30 ENCOUNTER — Other Ambulatory Visit: Payer: Self-pay | Admitting: Neurology

## 2015-05-30 DIAGNOSIS — K219 Gastro-esophageal reflux disease without esophagitis: Secondary | ICD-10-CM

## 2015-05-30 MED ORDER — OMEPRAZOLE-SODIUM BICARBONATE 40-1100 MG PO CAPS: 1.0000 | ORAL_CAPSULE | Freq: Every day | ORAL | Status: DC

## 2015-06-12 ENCOUNTER — Encounter: Payer: Self-pay | Admitting: Allergy and Immunology

## 2015-06-12 ENCOUNTER — Ambulatory Visit: Payer: Commercial Managed Care - HMO

## 2015-06-12 ENCOUNTER — Ambulatory Visit (INDEPENDENT_AMBULATORY_CARE_PROVIDER_SITE_OTHER): Payer: Commercial Managed Care - HMO | Admitting: Allergy and Immunology

## 2015-06-12 VITALS — BP 148/60 | HR 84 | Resp 16

## 2015-06-12 DIAGNOSIS — K219 Gastro-esophageal reflux disease without esophagitis: Secondary | ICD-10-CM

## 2015-06-12 DIAGNOSIS — T7800XD Anaphylactic reaction due to unspecified food, subsequent encounter: Secondary | ICD-10-CM | POA: Diagnosis not present

## 2015-06-12 DIAGNOSIS — J309 Allergic rhinitis, unspecified: Secondary | ICD-10-CM

## 2015-06-12 DIAGNOSIS — T7800XA Anaphylactic reaction due to unspecified food, initial encounter: Secondary | ICD-10-CM | POA: Insufficient documentation

## 2015-06-12 DIAGNOSIS — J4550 Severe persistent asthma, uncomplicated: Secondary | ICD-10-CM | POA: Diagnosis not present

## 2015-06-12 DIAGNOSIS — J387 Other diseases of larynx: Secondary | ICD-10-CM

## 2015-06-12 DIAGNOSIS — H1010 Acute atopic conjunctivitis, unspecified eye: Secondary | ICD-10-CM

## 2015-06-12 DIAGNOSIS — J4540 Moderate persistent asthma, uncomplicated: Secondary | ICD-10-CM

## 2015-06-12 NOTE — Patient Instructions (Signed)
  1. Continue Advair 230 two inhalations two times per day  2. Attempt to decrease Qvar 80 to one inhalation twice a day  3. Continue Zegerid 40 mg daily  4. Continue Qnasl one puff each nostril daily  5. Continue Xolair and EpiPen  6. Return to clinic in 6 months or earlier if problem

## 2015-06-12 NOTE — Progress Notes (Signed)
[facility]  Follow-up Note  Refering Provider: Orma Flaming, MD Primary Provider: Lamar Blinks, MD  Subjective:   Ashley Villarreal is a 59 y.o. female who returns to the [facility] [place] in re-evaluation of the following:  HPI Comments:  Ashley Villarreal returns to this clinic on 06/12/2015 in reevaluation of her severe asthma treated with Xolair, allergic rhinitis, LPR, and history of food allergy. She's had a very good 6 month interval with only 1 exacerbation of her asthma in July of this year requiring a systemic steroid. This appeared to be precipitated by a viral respiratory tract infection involving her upper airways. Otherwise, she has no significant respiratory tract symptoms and does not use a short acting bronchodilator and can exert herself to some degree without any difficulty. She was diagnosed with an enlarged heart and had a stress test and a sleep study which identified a component of sleep apnea and she is now using a CPAP machine. Her reflux is under excellent control while using Zegerid. Her nose is not been causing her any problem while using Qnasl. She did obtain a flu vaccine   Current Outpatient Prescriptions on File Prior to Visit  Medication Sig Dispense Refill  . beclomethasone (QVAR) 80 MCG/ACT inhaler Inhale 2 puffs into the lungs 2 (two) times daily. 1 Inhaler 1  . EPINEPHrine (EPIPEN 2-PAK) 0.3 mg/0.3 mL IJ SOAJ injection Inject 0.3 mLs (0.3 mg total) into the muscle once. 2 Device 1  . fluticasone-salmeterol (ADVAIR HFA) 230-21 MCG/ACT inhaler Inhale 2 puffs into the lungs 2 (two) times daily. 1 Inhaler 0  . glipiZIDE (GLUCOTROL) 5 MG tablet TAKE 1 TABLET (5 MG TOTAL) BY MOUTH DAILY BEFORE BREAKFAST 90 tablet 0  . glucose blood (ONE TOUCH ULTRA TEST) test strip Please check blood sugar daily. Dx code: E11.9 100 each 3  . ipratropium-albuterol (DUONEB) 0.5-2.5 (3) MG/3ML SOLN Take 3 mLs by nebulization  every 4 (four) hours as needed. 120 mL 2  . levalbuterol (XOPENEX HFA) 45 MCG/ACT inhaler Inhale 1-2 puffs into the lungs every 4 (four) hours as needed. 3 Inhaler 3  . losartan (COZAAR) 100 MG tablet TAKE 1 TABLET (100 MG TOTAL) BY MOUTH DAILY. 90 tablet 3  . metFORMIN (GLUCOPHAGE) 500 MG tablet TAKE 1 TABLET (500 MG TOTAL) BY MOUTH 2 (TWO) TIMES DAILY WITH A MEAL. 180 tablet 3  . omeprazole-sodium bicarbonate (ZEGERID) 40-1100 MG capsule Take 1 capsule by mouth daily before breakfast. 90 capsule 0  . simvastatin (ZOCOR) 20 MG tablet Take one daily for cholesterol 90 tablet 3   Current Facility-Administered Medications on File Prior to Visit  Medication Dose Route Frequency Provider Last Rate Last Dose  . omalizumab Arvid Right) injection 150 mg  150 mg Subcutaneous Q28 days Jiles Prows, MD   150 mg at 06/12/15 0956    No orders of the defined types were placed in this encounter.    Past Medical History  Diagnosis Date  . Allergy   . Diabetes mellitus without complication ([place])   . Asthma   . Hypertension   . COPD (chronic obstructive pulmonary disease) [facility])     Past Surgical History  Procedure Laterality Date  . Tonsillectomy  1975    Allergies  Allergen Reactions  . Claritin [Loratadine]     dizziness  . Doxycycline Nausea Only  . Other     Magic mouthwash    Review of Systems  Constitutional: Negative.   HENT:  Negative.   Eyes: Negative.   Respiratory: Negative.   Cardiovascular: Negative.   Gastrointestinal: Negative.   Endocrine: Negative.   Musculoskeletal: Negative.   Skin: Negative.   Neurological: Negative.   Hematological: Negative.   Psychiatric/Behavioral: Negative.      Objective:   Filed Vitals:   06/12/15 1032  BP: 148/60  Pulse: 84  Resp: 16          Physical Exam  Constitutional: She appears well-developed and well-nourished. No distress.  HENT:  Head: Normocephalic and atraumatic. Head is without right periorbital erythema and  without left periorbital erythema.  Right Ear: Tympanic membrane, external ear and ear canal normal. No drainage or tenderness. No foreign bodies. Tympanic membrane is not injected, not scarred, not perforated, not erythematous, not retracted and not bulging. No middle ear effusion.  Left Ear: Tympanic membrane, external ear and ear canal normal. No drainage or tenderness. No foreign bodies. Tympanic membrane is not injected, not scarred, not perforated, not erythematous, not retracted and not bulging.  No middle ear effusion.  Nose: Nose normal. No mucosal edema, rhinorrhea, nose lacerations or sinus tenderness.  No foreign bodies.  Mouth/Throat: Oropharynx is clear and moist. No oropharyngeal exudate, posterior oropharyngeal edema, posterior oropharyngeal erythema or tonsillar abscesses.  Eyes: Lids are normal. Right eye exhibits no chemosis, no discharge and no exudate. No foreign body present in the right eye. Left eye exhibits no chemosis, no discharge and no exudate. No foreign body present in the left eye. Right conjunctiva is not injected. Left conjunctiva is not injected.  Neck: Neck supple. No tracheal tenderness present. No tracheal deviation and no edema present. No thyroid mass and no thyromegaly present.  Cardiovascular: Normal rate, regular rhythm, S1 normal and S2 normal.  Exam reveals no gallop.   No murmur heard. Pulmonary/Chest: No accessory muscle usage or stridor. No respiratory distress. She has no wheezes. She has no rhonchi. She has no rales.  Abdominal: Soft.  Lymphadenopathy:       Head (right side): No tonsillar adenopathy present.       Head (left side): No tonsillar adenopathy present.    She has no cervical adenopathy.  Neurological: She is alert.  Skin: No rash noted. She is not diaphoretic.  Psychiatric: She has a normal mood and affect. Her behavior is normal.    Diagnostics:    Spirometry was performed and demonstrated an FEV1 of 1.74 at 74 % of  predicted.  The patient had an Asthma Control Test with the following results:  .    Assessment and Plan:   1. Asthma with acute exacerbation, mild intermittent   2. Severe persistent asthma, uncomplicated   3. Allergic rhinoconjunctivitis   4. LPRD (laryngopharyngeal reflux disease)   5. Allergy with anaphylaxis due to food, subsequent encounter      1. Continue Advair 230 two inhalations two times per day  2. Attempt to decrease Qvar 80 to one inhalation twice a day  3. Continue Zegerid 40 mg daily  4. Continue Qnasl one puff each nostril daily  5. Continue Xolair and EpiPen  6. Return to clinic in 6 months or earlier if problem  Ashley Villarreal has done very well over the course of the past 6 months while using Xolair. I'll make the assumption that we can consolidate her medical therapy and have her decrease her Qvar by 50% while she continues to use her Advair to 230 every day. Her reflux appears to be doing very well and we'll not change  his therapy and her nose appears to be doing very well and we'll not change this therapy. She has a very good understanding of her respiratory condition and understands how to use medications consistently and we will continue to have her use this plan and see her back in this clinic in 6 months or earlier if there is a problem.   Allena Katz, MD [place]

## 2015-07-02 ENCOUNTER — Other Ambulatory Visit: Payer: Self-pay

## 2015-07-02 ENCOUNTER — Other Ambulatory Visit: Payer: Self-pay | Admitting: *Deleted

## 2015-07-02 MED ORDER — BECLOMETHASONE DIPROPIONATE 80 MCG/ACT IN AERS: 2.0000 | INHALATION_SPRAY | Freq: Two times a day (BID) | RESPIRATORY_TRACT | Status: DC

## 2015-07-08 ENCOUNTER — Other Ambulatory Visit: Payer: Self-pay | Admitting: Family Medicine

## 2015-07-11 ENCOUNTER — Ambulatory Visit (INDEPENDENT_AMBULATORY_CARE_PROVIDER_SITE_OTHER): Payer: 59

## 2015-07-11 DIAGNOSIS — J4540 Moderate persistent asthma, uncomplicated: Secondary | ICD-10-CM | POA: Diagnosis not present

## 2015-07-12 ENCOUNTER — Encounter: Payer: Self-pay | Admitting: Family Medicine

## 2015-07-17 ENCOUNTER — Encounter: Payer: Self-pay | Admitting: Family Medicine

## 2015-07-17 ENCOUNTER — Telehealth: Payer: Self-pay

## 2015-07-17 NOTE — Telephone Encounter (Signed)
Called her back but had to [facility]- I received a RF request so I thought she was still taking it.  However if she has stopped glipizide that is certainly fine.  I do not think that she needs it for glucose control.   Simply do not pick it up Let's plan to recheck A1c in 3-4 months

## 2015-07-17 NOTE — Telephone Encounter (Signed)
Patient states dr. Lorelei Pont told her not to take glipiZIDE (GLUCOTROL) 5 MG tablet Yet that is the medication that is waiting for her at the pharmacy    [phone]

## 2015-07-17 NOTE — Telephone Encounter (Signed)
Patient Instructions     It was great to see you today! I will be in touch with your labs asap Assuming that your kidney function looks good and that your A1c is still controlled, we could consider stopping your glipizide Continue to keep an eye on your BP at home; if you start running higher than 140/90 on a regular basis please let me know    Please clarify.

## 2015-08-03 ENCOUNTER — Encounter: Payer: Self-pay | Admitting: Family Medicine

## 2015-08-03 ENCOUNTER — Ambulatory Visit (INDEPENDENT_AMBULATORY_CARE_PROVIDER_SITE_OTHER): Payer: Commercial Managed Care - HMO | Admitting: Family Medicine

## 2015-08-03 VITALS — BP 140/78 | HR 81 | Temp 99.0°F | Resp 16 | Ht 67.5 in | Wt 159.2 lb

## 2015-08-03 DIAGNOSIS — E119 Type 2 diabetes mellitus without complications: Secondary | ICD-10-CM | POA: Diagnosis not present

## 2015-08-03 DIAGNOSIS — E785 Hyperlipidemia, unspecified: Secondary | ICD-10-CM | POA: Diagnosis not present

## 2015-08-03 DIAGNOSIS — H9190 Unspecified hearing loss, unspecified ear: Secondary | ICD-10-CM

## 2015-08-03 LAB — COMPREHENSIVE METABOLIC PANEL
ALBUMIN: 4.2 g/dL (ref 3.6–5.1)
ALT: 15 U/L (ref 6–29)
AST: 17 U/L (ref 10–35)
Alkaline Phosphatase: 64 U/L (ref 33–130)
BUN: 18 mg/dL (ref 7–25)
CHLORIDE: 103 mmol/L (ref 98–110)
CO2: 28 mmol/L (ref 20–31)
Calcium: 9.9 mg/dL (ref 8.6–10.4)
Creat: 1.32 mg/dL — ABNORMAL HIGH (ref 0.50–1.05)
Glucose, Bld: 91 mg/dL (ref 65–99)
POTASSIUM: 4.3 mmol/L (ref 3.5–5.3)
Sodium: 141 mmol/L (ref 135–146)
TOTAL PROTEIN: 7.1 g/dL (ref 6.1–8.1)
Total Bilirubin: 0.3 mg/dL (ref 0.2–1.2)

## 2015-08-03 LAB — LIPID PANEL
CHOL/HDL RATIO: 3.6 ratio (ref <=5.0)
CHOLESTEROL: 174 mg/dL (ref 125–200)
HDL: 49 mg/dL (ref >=46)
LDL Cholesterol: 109 mg/dL (ref <130)
TRIGLYCERIDES: 81 mg/dL (ref <150)
VLDL: 16 mg/dL (ref <30)

## 2015-08-03 LAB — HEMOGLOBIN A1C
Hgb A1c MFr Bld: 6.1 % — ABNORMAL HIGH (ref <5.7)
Mean Plasma Glucose: 128 mg/dL — ABNORMAL HIGH (ref <117)

## 2015-08-03 MED ORDER — FLUTICASONE PROPIONATE 50 MCG/ACT NA SUSP: 2.0000 | Freq: Every day | NASAL | Status: DC

## 2015-08-03 NOTE — Patient Instructions (Signed)
I will be in touch with your labs asap Try the flonase nasal spray for your ear/ taste/ smell symptoms. However if this does not seem to help your hearing I would suggest that you see an audiologist to have your hearing formally tested. If you do have hearing loss a hearing aid can improve your quality of life!  There are many to choose from in our area but here are a couple of options   [facility] [address]  [phone]  [facility] [place]  [phone]  Assuming that you labs look good please plan to recheck your diabetes in 4-6 months  It would be my pleasure to continue to see you as a patient at my new office, starting the last week of February.  If you prefer to remain at [facility] one of my partners will be happy to see you here.   [facility] [address] [place], [place] [address] Phone: [phone]

## 2015-08-03 NOTE — Progress Notes (Signed)
[facility] [address], [place] [address] [phone]  Date:  08/03/2015   Name:  Ashley Villarreal   DOB:  16-Dec-1955   MRN:  [id number]  PCP:  Lamar Blinks, MD    Chief Complaint: consultation; Hgb A1c check; creatinine level check; and ears stopped up   History of Present Illness:  Ashley Villarreal is a 60 y.o. very pleasant female patient who presents with the following:  Here today for a recheck and labs. History of DM, asthma, HTN.  Most recent cholesterol panel in August of 2016  She will be seen by the nephrologist at some point soon- she has not yet been seen but her appt is coming up She also notes some difficutly with hearing- she feels like her ears are "blocked up," admits that she failed a hearing test in the past and that she has learned to read lips.  However she also notes that when she has been on prednisone for an asthma flare her hearing sx have been better Lab Results  Component Value Date   HGBA1C 6.2* 05/01/2015   BP Readings from Last 3 Encounters:  08/03/15 140/78  06/12/15 148/60  05/27/15 128/72     Patient Active Problem List   Diagnosis Date Noted  . Severe persistent asthma 06/12/2015  . LPRD (laryngopharyngeal reflux disease) 06/12/2015  . Allergic rhinoconjunctivitis 06/12/2015  . Allergy with anaphylaxis due to food 06/12/2015  . Elevated serum creatinine 05/01/2015  . Obstructive sleep apnea   . Laryngopharyngeal reflux 02/23/2015  . Hyperlipemia 08/10/2011  . Asthma with acute exacerbation 08/10/2011  . Hypertensive heart disease   . Diabetes mellitus type 2, noninsulin dependent [facility])     Past Medical History  Diagnosis Date  . Allergy   . Diabetes mellitus without complication ([place])   . Asthma   . Hypertension   . COPD (chronic obstructive pulmonary disease) [facility])     Past Surgical History  Procedure Laterality Date  . Tonsillectomy  1975    Social History  Substance Use Topics  . Smoking  status: Never Smoker   . Smokeless tobacco: None  . Alcohol Use: No    Family History  Problem Relation Age of Onset  . Stroke Father   . Heart disease Father   . Diabetes Mother   . Heart disease Mother   . Stroke Mother   . Hyperlipidemia Sister   . Hyperlipidemia Brother   . Diabetes Paternal Grandmother   . Hypertension Brother     Allergies  Allergen Reactions  . Claritin [Loratadine]     dizziness  . Doxycycline Nausea Only  . Other     Magic mouthwash    Medication list has been reviewed and updated.  Current Outpatient Prescriptions on File Prior to Visit  Medication Sig Dispense Refill  . beclomethasone (QVAR) 80 MCG/ACT inhaler Inhale 2 puffs into the lungs 2 (two) times daily. 1 Inhaler 5  . EPINEPHrine (EPIPEN 2-PAK) 0.3 mg/0.3 mL IJ SOAJ injection Inject 0.3 mLs (0.3 mg total) into the muscle once. 2 Device 1  . fluticasone-salmeterol (ADVAIR HFA) 230-21 MCG/ACT inhaler Inhale 2 puffs into the lungs 2 (two) times daily. 1 Inhaler 0  . glucose blood (ONE TOUCH ULTRA TEST) test strip Please check blood sugar daily. Dx code: E11.9 100 each 3  . ipratropium-albuterol (DUONEB) 0.5-2.5 (3) MG/3ML SOLN Take 3 mLs by nebulization every 4 (four) hours as needed. 120 mL 2  . levalbuterol (XOPENEX HFA) 45 MCG/ACT  inhaler Inhale 1-2 puffs into the lungs every 4 (four) hours as needed. 3 Inhaler 3  . losartan (COZAAR) 100 MG tablet TAKE 1 TABLET (100 MG TOTAL) BY MOUTH DAILY. 90 tablet 3  . metFORMIN (GLUCOPHAGE) 500 MG tablet TAKE 1 TABLET (500 MG TOTAL) BY MOUTH 2 (TWO) TIMES DAILY WITH A MEAL. 180 tablet 3  . omeprazole-sodium bicarbonate (ZEGERID) 40-1100 MG capsule Take 1 capsule by mouth daily before breakfast. 90 capsule 0  . simvastatin (ZOCOR) 20 MG tablet Take one daily for cholesterol 90 tablet 3   Current Facility-Administered Medications on File Prior to Visit  Medication Dose Route Frequency Provider Last Rate Last Dose  . omalizumab Arvid Right) injection 150 mg   150 mg Subcutaneous Q28 days Jiles Prows, MD   150 mg at 07/11/15 1019    Review of Systems:  As per HPI- otherwise negative.   Physical Examination: Filed Vitals:   08/03/15 0854 08/03/15 0855  BP: 150/68 140/78  Pulse: 81   Temp: 99 F (37.2 C)   Resp: 16    Filed Vitals:   08/03/15 0854  Height: 5' 7.5" (1.715 m)  Weight: 159 lb 3.2 oz (72.213 kg)   Body mass index is 24.55 kg/(m^2). Ideal Body Weight: Weight in (lb) to have BMI = 25: 161.7  GEN: WDWN, NAD, Non-toxic, A & O x 3, looks well, normal weight HEENT: Atraumatic, Normocephalic. Neck supple. No masses, No LAD.  Bilateral TM wnl, oropharynx normal.  PEERL,EOMI.   Shows signs of being HOH- she tilts the left ear towards me in order to hear better Ears and Nose: No external deformity. CV: RRR, No M/G/R. No JVD. No thrill. No extra heart sounds. PULM: CTA B, no wheezes, crackles, rhonchi. No retractions. No resp. distress. No accessory muscle use. ABD: S, NT, ND, +BS. No rebound. No HSM. EXTR: No c/c/e NEURO Normal gait.  PSYCH: Normally interactive. Conversant. Not depressed or anxious appearing.  Calm demeanor.    Assessment and Plan: Decreased hearing, unspecified laterality - Plan: fluticasone (FLONASE) 50 MCG/ACT nasal spray  Controlled type 2 diabetes mellitus without complication, without long-term current use of insulin (HCC) - Plan: Comprehensive metabolic panel, Hemoglobin A1c  Dyslipidemia - Plan: Lipid panel  Await labs for her DM and hyperlipidemia  Will try a nasal spray for her ear sx- however if this does not help advised that she see an audiologist  Await labs and will be in touch with her  Signed Lamar Blinks, MD

## 2015-08-08 ENCOUNTER — Ambulatory Visit (INDEPENDENT_AMBULATORY_CARE_PROVIDER_SITE_OTHER): Payer: 59

## 2015-08-08 DIAGNOSIS — J4540 Moderate persistent asthma, uncomplicated: Secondary | ICD-10-CM

## 2015-08-20 ENCOUNTER — Telehealth: Payer: Self-pay | Admitting: Pediatrics

## 2015-08-20 NOTE — Telephone Encounter (Signed)
Has a question about her bill

## 2015-08-21 NOTE — Telephone Encounter (Signed)
Patient placed a 2nd call requesting a call back. I explained that billing is backed up at this time but will call back soon as possible.

## 2015-08-21 NOTE — Telephone Encounter (Signed)
Will apply her copays

## 2015-08-23 ENCOUNTER — Other Ambulatory Visit: Payer: Self-pay | Admitting: *Deleted

## 2015-08-23 DIAGNOSIS — K219 Gastro-esophageal reflux disease without esophagitis: Secondary | ICD-10-CM

## 2015-08-23 MED ORDER — OMEPRAZOLE-SODIUM BICARBONATE 40-1100 MG PO CAPS: 1.0000 | ORAL_CAPSULE | Freq: Every day | ORAL | Status: DC

## 2015-08-28 ENCOUNTER — Telehealth: Payer: Self-pay | Admitting: Allergy and Immunology

## 2015-08-28 ENCOUNTER — Other Ambulatory Visit: Payer: Self-pay | Admitting: *Deleted

## 2015-08-28 ENCOUNTER — Other Ambulatory Visit: Payer: Self-pay | Admitting: Allergy and Immunology

## 2015-08-28 MED ORDER — OMALIZUMAB 150 MG ~~LOC~~ SOLR: 300.0000 mg | SUBCUTANEOUS | Status: DC

## 2015-08-28 NOTE — Telephone Encounter (Signed)
[facility] said there is a discrepancy in her Xolair RX. Previously it was written for 150mg  for 4 weeks. Now its for 300mg  and 1 vial. Please call to verify.

## 2015-08-29 MED ORDER — OMALIZUMAB 150 MG ~~LOC~~ SOLR: 150.0000 mg | SUBCUTANEOUS | Status: DC

## 2015-08-29 NOTE — Telephone Encounter (Signed)
SENT CORRECTED RX TO [facility]

## 2015-08-30 ENCOUNTER — Other Ambulatory Visit: Payer: Self-pay

## 2015-08-30 MED ORDER — FLUTICASONE-SALMETEROL 230-21 MCG/ACT IN AERO: 2.0000 | INHALATION_SPRAY | Freq: Two times a day (BID) | RESPIRATORY_TRACT | Status: DC

## 2015-09-04 ENCOUNTER — Other Ambulatory Visit: Payer: Self-pay | Admitting: Nephrology

## 2015-09-04 DIAGNOSIS — N183 Chronic kidney disease, stage 3 (moderate): Principal | ICD-10-CM

## 2015-09-04 DIAGNOSIS — E1022 Type 1 diabetes mellitus with diabetic chronic kidney disease: Secondary | ICD-10-CM

## 2015-09-05 ENCOUNTER — Ambulatory Visit (INDEPENDENT_AMBULATORY_CARE_PROVIDER_SITE_OTHER): Payer: 59

## 2015-09-05 DIAGNOSIS — J4540 Moderate persistent asthma, uncomplicated: Secondary | ICD-10-CM | POA: Diagnosis not present

## 2015-09-09 ENCOUNTER — Ambulatory Visit
Admission: RE | Admit: 2015-09-09 | Discharge: 2015-09-09 | Disposition: A | Discharge status: Home / Self Care | Payer: 59 | Source: Ambulatory Visit | Attending: Nephrology | Admitting: Nephrology

## 2015-09-09 DIAGNOSIS — N183 Chronic kidney disease, stage 3 unspecified: Secondary | ICD-10-CM

## 2015-09-09 DIAGNOSIS — E1022 Type 1 diabetes mellitus with diabetic chronic kidney disease: Secondary | ICD-10-CM

## 2015-10-03 ENCOUNTER — Ambulatory Visit (INDEPENDENT_AMBULATORY_CARE_PROVIDER_SITE_OTHER): Payer: 59

## 2015-10-03 DIAGNOSIS — J4540 Moderate persistent asthma, uncomplicated: Secondary | ICD-10-CM

## 2015-10-03 DIAGNOSIS — J309 Allergic rhinitis, unspecified: Secondary | ICD-10-CM

## 2015-10-12 ENCOUNTER — Other Ambulatory Visit: Payer: Self-pay | Admitting: Physician Assistant

## 2015-10-23 ENCOUNTER — Other Ambulatory Visit: Payer: Self-pay

## 2015-10-23 DIAGNOSIS — E785 Hyperlipidemia, unspecified: Secondary | ICD-10-CM

## 2015-10-23 MED ORDER — SIMVASTATIN 20 MG PO TABS: ORAL_TABLET | ORAL | Status: DC

## 2015-10-28 ENCOUNTER — Telehealth: Payer: Self-pay | Admitting: Allergy and Immunology

## 2015-10-28 NOTE — Telephone Encounter (Signed)
She has a question about her charges. She wants to know of you can look and see about her January charges. Were they written off or did insurance pay for them. She would also like to have an itemized statement for 2017.

## 2015-10-31 ENCOUNTER — Ambulatory Visit (INDEPENDENT_AMBULATORY_CARE_PROVIDER_SITE_OTHER): Payer: 59

## 2015-10-31 DIAGNOSIS — J4540 Moderate persistent asthma, uncomplicated: Secondary | ICD-10-CM

## 2015-11-04 ENCOUNTER — Ambulatory Visit (INDEPENDENT_AMBULATORY_CARE_PROVIDER_SITE_OTHER): Payer: 59 | Admitting: Family Medicine

## 2015-11-04 ENCOUNTER — Encounter: Payer: Self-pay | Admitting: Family Medicine

## 2015-11-04 ENCOUNTER — Encounter: Payer: 59 | Admitting: Family Medicine

## 2015-11-04 VITALS — BP 125/71 | HR 78 | Temp 98.2°F | Ht 68.0 in | Wt 148.2 lb

## 2015-11-04 DIAGNOSIS — Z130 Encounter for screening for diseases of the blood and blood-forming organs and certain disorders involving the immune mechanism: Secondary | ICD-10-CM | POA: Diagnosis not present

## 2015-11-04 DIAGNOSIS — Z0000 Encounter for general adult medical examination without abnormal findings: Secondary | ICD-10-CM | POA: Diagnosis not present

## 2015-11-04 DIAGNOSIS — Z87892 Personal history of anaphylaxis: Secondary | ICD-10-CM

## 2015-11-04 DIAGNOSIS — E1121 Type 2 diabetes mellitus with diabetic nephropathy: Secondary | ICD-10-CM | POA: Diagnosis not present

## 2015-11-04 DIAGNOSIS — K219 Gastro-esophageal reflux disease without esophagitis: Secondary | ICD-10-CM

## 2015-11-04 DIAGNOSIS — I10 Essential (primary) hypertension: Secondary | ICD-10-CM | POA: Diagnosis not present

## 2015-11-04 LAB — HEMOGLOBIN A1C: Hgb A1c MFr Bld: 6.1 % (ref 4.6–6.5)

## 2015-11-04 LAB — CBC
HCT: 32.7 % — ABNORMAL LOW (ref 36.0–46.0)
HEMOGLOBIN: 10.5 g/dL — AB (ref 12.0–15.0)
MCHC: 32.3 g/dL (ref 30.0–36.0)
MCV: 82.2 fl (ref 78.0–100.0)
PLATELETS: 249 10*3/uL (ref 150.0–400.0)
RBC: 3.97 Mil/uL (ref 3.87–5.11)
RDW: 15.1 % (ref 11.5–15.5)
WBC: 7.3 10*3/uL (ref 4.0–10.5)

## 2015-11-04 MED ORDER — METFORMIN HCL 500 MG PO TABS: ORAL_TABLET | ORAL | Status: DC

## 2015-11-04 MED ORDER — EPINEPHRINE 0.3 MG/0.3ML IJ SOAJ: 0.3000 mg | Freq: Once | INTRAMUSCULAR | Status: DC

## 2015-11-04 MED ORDER — LOSARTAN POTASSIUM 100 MG PO TABS: ORAL_TABLET | ORAL | Status: DC

## 2015-11-04 MED ORDER — OMEPRAZOLE-SODIUM BICARBONATE 40-1100 MG PO CAPS: 1.0000 | ORAL_CAPSULE | Freq: Every day | ORAL | Status: DC

## 2015-11-04 NOTE — Progress Notes (Signed)
[place] at [facility] [address], [place],  [address] [phone]  Date:  11/04/2015   Name:  Ashley Villarreal   DOB:  [birth year]   MRN:  [id number]  PCP:  Lamar Blinks, MD    Chief Complaint: Annual Exam   History of Present Illness:  Ashley Villarreal is a 60 y.o. very pleasant female patient who presents with the following:  She is here today for a CPE.  She is getting allergy shots to help with her asthma- this is helping her already.  She does see OB-GYN for her pap but I do her clinical breast exam She is sleeping well with her CPAP- she is feeling well rested.   She is checking her BP twice a day and getting really good numbers at home-she brings in a list with her today BP ranging 121- 136/ 64- 77 with multiple data points DM is well controlled  Due for a CBC and A1c today  Lab Results  Component Value Date   HGBA1C 6.1* 08/03/2015   She is walking 5 miles a day and keeping track with her fitbit. She has lost significant weight!   Wt Readings from Last 3 Encounters:  11/04/15 148 lb 3.2 oz (67.223 kg)  08/03/15 159 lb 3.2 oz (72.213 kg)  05/27/15 169 lb (76.658 kg)   Overall she feels great and has no other concerns today   Patient Active Problem List   Diagnosis Date Noted  . Severe persistent asthma 06/12/2015  . LPRD (laryngopharyngeal reflux disease) 06/12/2015  . Allergic rhinoconjunctivitis 06/12/2015  . Allergy with anaphylaxis due to food 06/12/2015  . Elevated serum creatinine 05/01/2015  . Obstructive sleep apnea   . Laryngopharyngeal reflux 02/23/2015  . Hyperlipemia 08/10/2011  . Asthma with acute exacerbation 08/10/2011  . Hypertensive heart disease   . Diabetes mellitus type 2, noninsulin dependent [facility])     Past Medical History  Diagnosis Date  . Allergy   . Diabetes mellitus without complication ([place])   . Asthma   . Hypertension   . COPD (chronic obstructive pulmonary  disease) [facility])     Past Surgical History  Procedure Laterality Date  . Tonsillectomy  1975    Social History  Substance Use Topics  . Smoking status: Never Smoker   . Smokeless tobacco: None  . Alcohol Use: No    Family History  Problem Relation Age of Onset  . Stroke Father   . Heart disease Father   . Diabetes Mother   . Heart disease Mother   . Stroke Mother   . Hyperlipidemia Sister   . Hyperlipidemia Brother   . Diabetes Paternal Grandmother   . Hypertension Brother     Allergies  Allergen Reactions  . Claritin [Loratadine]     dizziness  . Doxycycline Nausea Only  . Other     Magic mouthwash    Medication list has been reviewed and updated.  Current Outpatient Prescriptions on File Prior to Visit  Medication Sig Dispense Refill  . beclomethasone (QVAR) 80 MCG/ACT inhaler Inhale 2 puffs into the lungs 2 (two) times daily. 1 Inhaler 5  . EPINEPHrine (EPIPEN 2-PAK) 0.3 mg/0.3 mL IJ SOAJ injection Inject 0.3 mLs (0.3 mg total) into the muscle once. 2 Device 1  . fluticasone (FLONASE) 50 MCG/ACT nasal spray Place 2 sprays into both nostrils daily. 16 g 6  . fluticasone-salmeterol (ADVAIR HFA) 230-21 MCG/ACT inhaler Inhale 2 puffs into the lungs  2 (two) times daily. 1 Inhaler 2  . ipratropium-albuterol (DUONEB) 0.5-2.5 (3) MG/3ML SOLN Take 3 mLs by nebulization every 4 (four) hours as needed. 120 mL 2  . losartan (COZAAR) 100 MG tablet TAKE 1 TABLET (100 MG TOTAL) BY MOUTH DAILY. 90 tablet 3  . metFORMIN (GLUCOPHAGE) 500 MG tablet TAKE 1 TABLET (500 MG TOTAL) BY MOUTH 2 (TWO) TIMES DAILY WITH A MEAL. 180 tablet 3  . omalizumab (XOLAIR) 150 MG injection Inject 150 mg into the skin every 28 (twenty-eight) days. 1 each 11  . omeprazole-sodium bicarbonate (ZEGERID) 40-1100 MG capsule Take 1 capsule by mouth daily before breakfast. 90 capsule 0  . ONE TOUCH ULTRA TEST test strip PLEASE CHECK BLOOD SUGAR DAILY. DX CODE: E11.9 100 each 3  . simvastatin (ZOCOR) 20 MG tablet  Take one daily for cholesterol 90 tablet 1  . levalbuterol (XOPENEX HFA) 45 MCG/ACT inhaler Inhale 1-2 puffs into the lungs every 4 (four) hours as needed. 3 Inhaler 3   Current Facility-Administered Medications on File Prior to Visit  Medication Dose Route Frequency Provider Last Rate Last Dose  . omalizumab Arvid Right) injection 150 mg  150 mg Subcutaneous Q28 days Jiles Prows, MD   150 mg at 10/31/15 1214    Review of Systems:  As per HPI- otherwise negative.   Physical Examination: Filed Vitals:   11/04/15 0924 11/04/15 0931  BP: 177/79 158/80  Pulse: 78   Temp: 98.2 F (36.8 C)    Filed Vitals:   11/04/15 0924  Height: 5\' 8"  (1.727 m)  Weight: 148 lb 3.2 oz (67.223 kg)   Body mass index is 22.54 kg/(m^2). Ideal Body Weight: Weight in (lb) to have BMI = 25: 164.1  GEN: WDWN, NAD, Non-toxic, A & O x 3, has lost weight, looks very well HEENT: Atraumatic, Normocephalic. Neck supple. No masses, No LAD.  Bilateral TM wnl, oropharynx normal.  PEERL,EOMI.   Ears and Nose: No external deformity. CV: RRR, No M/G/R. No JVD. No thrill. No extra heart sounds. PULM: CTA B, no wheezes, crackles, rhonchi. No retractions. No resp. distress. No accessory muscle use. ABD: S, NT, ND. No rebound. No HSM. EXTR: No c/c/e NEURO Normal gait.  PSYCH: Normally interactive. Conversant. Not depressed or anxious appearing.  Calm demeanor.  Breast: normal exam, no masses/ dimpling/ discharge    Assessment and Plan: Physical exam  History of anaphylaxis - Plan: EPINEPHrine (EPIPEN 2-PAK) 0.3 mg/0.3 mL IJ SOAJ injection  Controlled type 2 diabetes mellitus with diabetic nephropathy, without long-term current use of insulin ([place]) - Plan: Hemoglobin A1c, metFORMIN (GLUCOPHAGE) 500 MG tablet  Essential hypertension - Plan: losartan (COZAAR) 100 MG tablet  Gastroesophageal reflux disease without esophagitis - Plan: omeprazole-sodium bicarbonate (ZEGERID) 40-1100 MG capsule  Screening for  deficiency anemia - Plan: CBC  Here today for a CPE Did refills for her today Asked her to decrease her metformin to once a day- await A1c but suspect it will be low as she has lost weight BP a little high today while in clinic but excellent home nubmers Will plan further follow- up pending labs.   Signed Lamar Blinks, MD

## 2015-11-04 NOTE — Patient Instructions (Signed)
It was great to see you today- keep up the good work with your exercise!  You have lost 20 lbs!!!  Wt Readings from Last 3 Encounters:  11/04/15 148 lb 3.2 oz (67.223 kg)  08/03/15 159 lb 3.2 oz (72.213 kg)  05/27/15 169 lb (76.658 kg)   I will be in touch with your labs Let's check back in 6 months

## 2015-11-04 NOTE — Progress Notes (Signed)
Pre visit review using our clinic tool,if applicable. No additional management support is needed unless otherwise documented below in the visit note.  

## 2015-11-06 ENCOUNTER — Encounter: Payer: 59 | Admitting: Family Medicine

## 2015-11-19 ENCOUNTER — Other Ambulatory Visit: Payer: Self-pay | Admitting: *Deleted

## 2015-11-19 DIAGNOSIS — K219 Gastro-esophageal reflux disease without esophagitis: Secondary | ICD-10-CM

## 2015-11-19 MED ORDER — OMEPRAZOLE-SODIUM BICARBONATE 40-1100 MG PO CAPS: 1.0000 | ORAL_CAPSULE | Freq: Every day | ORAL | Status: DC

## 2015-11-19 MED ORDER — FLUTICASONE-SALMETEROL 230-21 MCG/ACT IN AERO: 2.0000 | INHALATION_SPRAY | Freq: Two times a day (BID) | RESPIRATORY_TRACT | Status: DC

## 2015-11-23 IMAGING — CR DG CHEST 2V
2 series · 2 of 2 positions shown · non-contrast
Comparison: 06/24/2013; 05/04/2012

CLINICAL DATA: Cough.  Asthma exacerbation.  Acute bronchitis.

EXAM:
CHEST  2 VIEW

[PA]
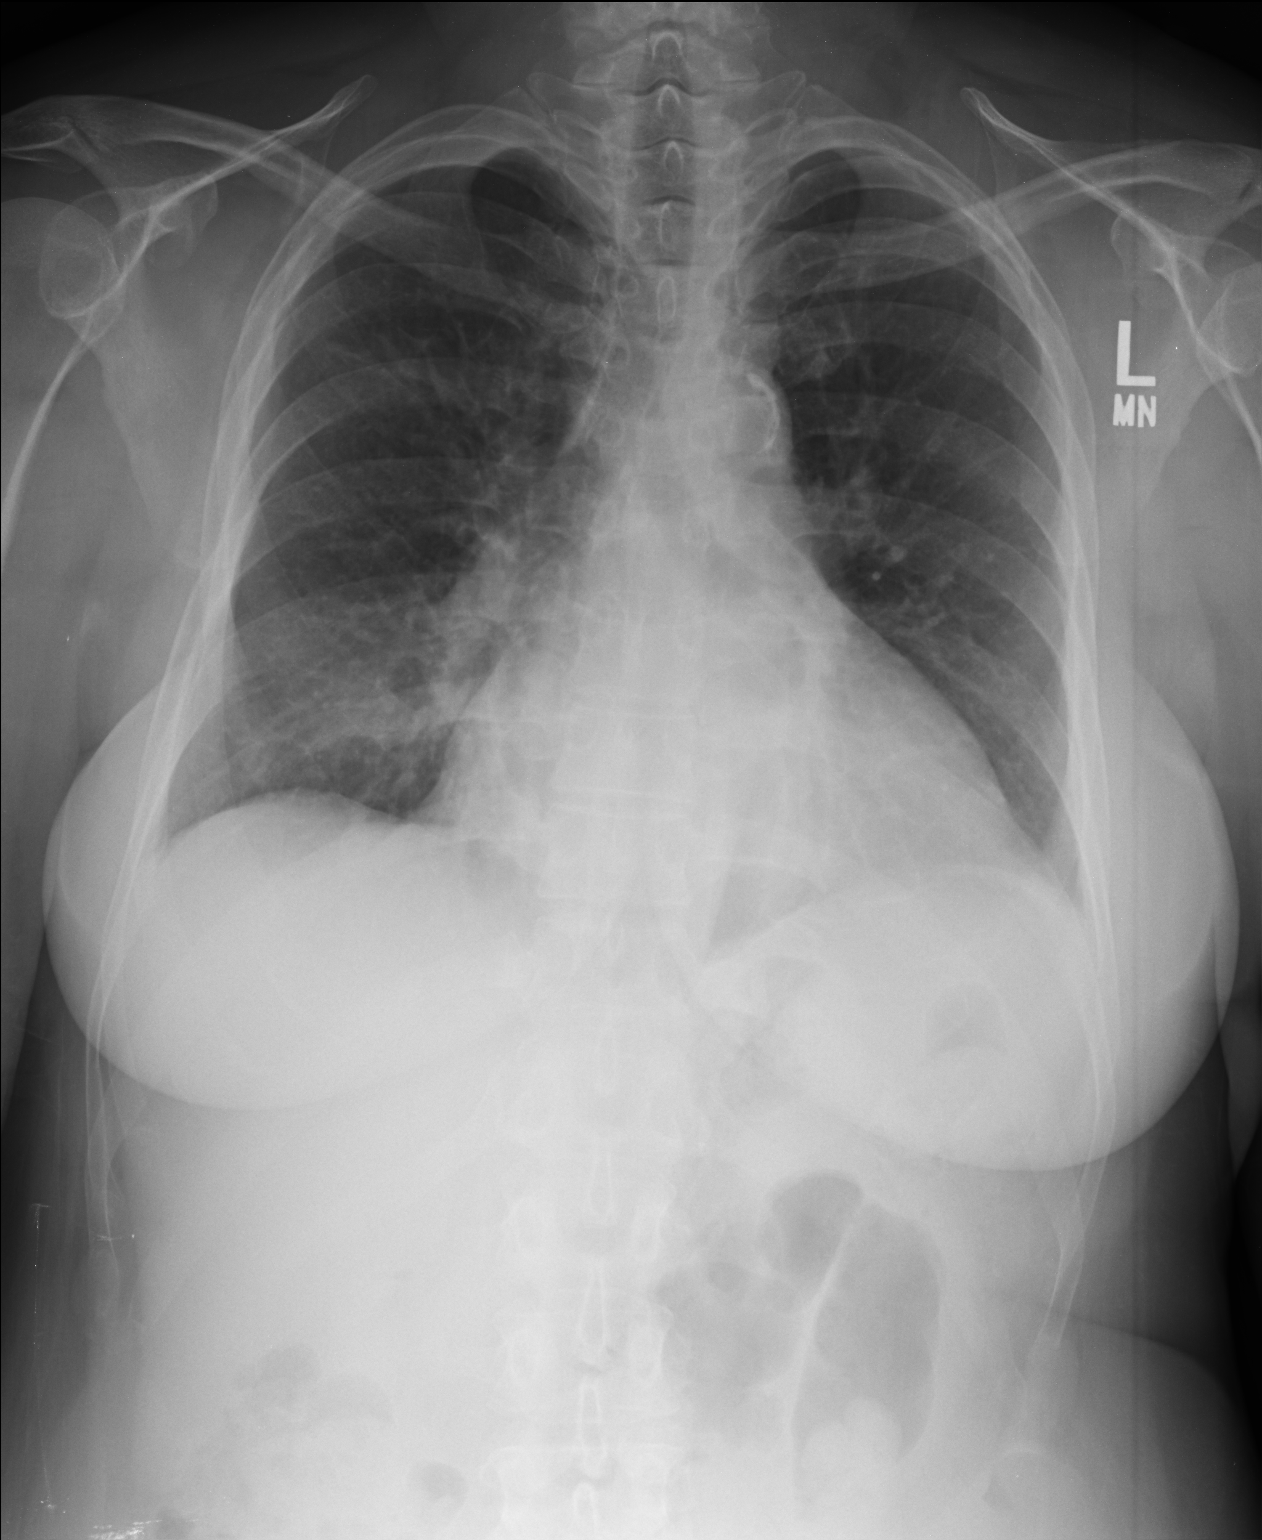

[lateral]
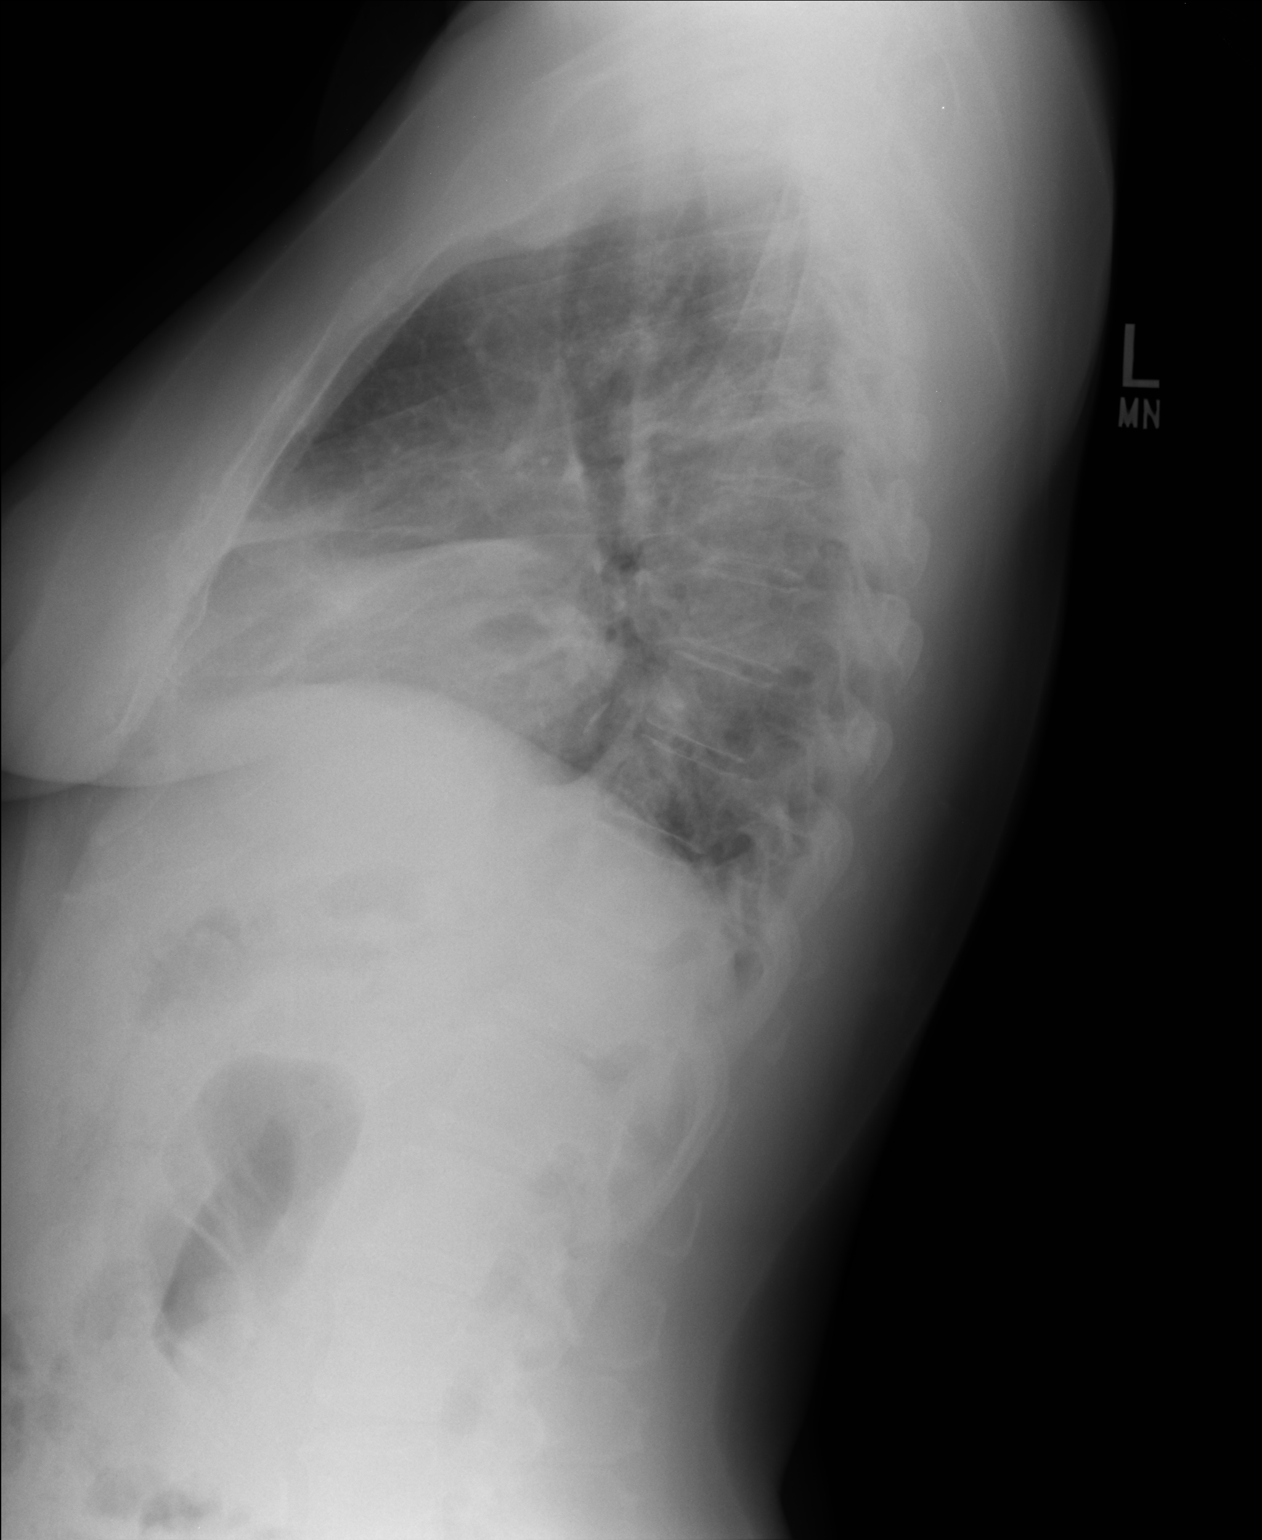

[2 of 2 positions shown; findings below may reference images not displayed]

FINDINGS: Grossly unchanged enlarged cardiac silhouette and mediastinal
contours with atherosclerotic plaque within thoracic aorta. Mild
pulmonary is congestion without frank evidence of edema. Worsening
heterogeneous airspace opacities within the bilateral lower lungs.
No pleural effusion or pneumothorax. No acute osseus abnormalities.
IMPRESSION: Worsening airspace opacities within the bilateral lower lungs
worrisome for multifocal infection, though note, in the setting of
cardiomegaly and atherosclerosis, asymmetric alveolar pulmonary
edema could have a similar appearance. A follow-up chest radiograph
in 3 to 4 weeks after treatment is recommended to ensure resolution.

## 2015-11-27 ENCOUNTER — Encounter: Payer: Self-pay | Admitting: Internal Medicine

## 2015-11-27 ENCOUNTER — Ambulatory Visit (INDEPENDENT_AMBULATORY_CARE_PROVIDER_SITE_OTHER): Payer: 59 | Admitting: Internal Medicine

## 2015-11-27 VITALS — BP 134/80 | HR 85 | Ht 69.0 in | Wt 149.8 lb

## 2015-11-27 DIAGNOSIS — J4550 Severe persistent asthma, uncomplicated: Secondary | ICD-10-CM

## 2015-11-27 DIAGNOSIS — J4521 Mild intermittent asthma with (acute) exacerbation: Secondary | ICD-10-CM

## 2015-11-27 DIAGNOSIS — G4733 Obstructive sleep apnea (adult) (pediatric): Secondary | ICD-10-CM | POA: Diagnosis not present

## 2015-11-27 NOTE — Assessment & Plan Note (Signed)
Well-controlled at this visit, managed by Dr. Neldon Mc on Arvid Right

## 2015-11-27 NOTE — Assessment & Plan Note (Deleted)
Much better control in recent years. She may no longer needed Advair and is not using a rescue inhaler. Plan-okay to drop off Advair but keep it available and restart if needed, as discussed. She is doing well enough now to follow with her primary physician, reducing extra doctor visits which are hard for her.

## 2015-11-27 NOTE — Progress Notes (Signed)
05/27/2015-60 year old female never smoker for sleep medicine evaluation. Pt referred by Dr. Wynonia Lawman for Sleep Apnea.  Sleep study completed in September.  pt states she had some loud snoring and waking very restless. pt currently using CPAP for about 2 months now and it is working great for pt. Epworth Score: 8 DME: CPAP 9/[facility] NPSG 02/11/15- AHI 12.5 per hour, CPAP to 9  Witnessed loud snoring and apneas described by her husband. Usual bedtime between 10 and 11 PM with latency 10-15 minutes, up once or twice before getting up 8:30 AM. ENT surgery-tonsils, sinus surgery Complicating medical problems including hypertension. History of asthma controlled with inhalers and Xolair injections (Dr. Neldon Mc) She reports being extremely pleased with her initial time on CPAP. She has no complaints and is able to wear it all night every night. Snoring was prevented and sleep quality is better.  11/27/2015-60 year old female never smoker followed for OSA, complicated by HBP, asthma/Xolair (Dr. Neldon Mc) CPAP 9/[facility] FOLLOWS FOR: DME [facility]; Pt wears CPAP every night for about 7 hours; no supplies needed at this time. Pressure works well for pt. Very good download report with excellent compliance and control on CPAP 9. She has lost 20 pounds. We described effects of weight loss. We discussed oral appliances as an alternative to CPAP.  ROS-see HPI   Negative unless "+" Constitutional:    weight loss, night sweats, fevers, chills, fatigue, lassitude. HEENT:    headaches, difficulty swallowing, tooth/dental problems, sore throat,       sneezing, itching, ear ache, nasal congestion, post nasal drip, snoring CV:    chest pain, orthopnea, PND, swelling in lower extremities, anasarca,                                                          dizziness, palpitations Resp:   shortness of breath with exertion or at rest.                productive cough,   non-productive cough, coughing up of blood.              change  in color of mucus.  wheezing.   Skin:    rash or lesions. GI:  No-   heartburn, indigestion, abdominal pain, nausea, vomiting,  GU: . MS:   joint pain, stiffness, decreased range of motion, back pain. Neuro-     nothing unusual Psych:  change in mood or affect.  depression or anxiety.   memory loss.  OBJ- Physical Exam General- Alert, Oriented, Affect-appropriate, Distress- none acute, normal weight Skin- rash-none, lesions- none, excoriation- none Lymphadenopathy- none Head- atraumatic            Eyes- Gross vision intact, PERRLA, conjunctivae and secretions clear            Ears- Hearing, canals-normal            Nose- Clear, no-Septal dev, mucus, polyps, erosion, perforation             Throat- Mallampati III-IV , mucosa clear , drainage- none, tonsils- atrophic Neck- flexible , trachea midline, no stridor , thyroid nl, carotid no bruit Chest - symmetrical excursion , unlabored           Heart/CV- RRR , no murmur , no gallop  , no rub, nl s1 s2                           -  JVD- none , edema- none, stasis changes- none, varices- none           Lung- clear to P&A, wheeze- none, cough- none , dullness-none, rub- none           Chest wall-  Abd-  Br/ Gen/ Rectal- Not done, not indicated Extrem- cyanosis- none, clubbing, none, atrophy- none, strength- nl Neuro- grossly intact to observation     

## 2015-11-27 NOTE — Patient Instructions (Addendum)
We can continue CPAP 9/ [facility]  Please call if we can help

## 2015-11-27 NOTE — Assessment & Plan Note (Signed)
Excellent compliance and control. Weight loss also helps. She is pleased with CPAP for now but interested in the possibility that with weight loss she might not need CPAP in the future, or might be able to change to an oral appliance.

## 2015-12-11 ENCOUNTER — Ambulatory Visit (INDEPENDENT_AMBULATORY_CARE_PROVIDER_SITE_OTHER): Payer: 59 | Admitting: Allergy and Immunology

## 2015-12-11 ENCOUNTER — Ambulatory Visit (INDEPENDENT_AMBULATORY_CARE_PROVIDER_SITE_OTHER): Payer: 59

## 2015-12-11 ENCOUNTER — Encounter: Payer: Self-pay | Admitting: Allergy and Immunology

## 2015-12-11 VITALS — BP 140/80 | HR 80 | Resp 16

## 2015-12-11 DIAGNOSIS — H1010 Acute atopic conjunctivitis, unspecified eye: Secondary | ICD-10-CM | POA: Diagnosis not present

## 2015-12-11 DIAGNOSIS — J4550 Severe persistent asthma, uncomplicated: Secondary | ICD-10-CM

## 2015-12-11 DIAGNOSIS — J387 Other diseases of larynx: Secondary | ICD-10-CM

## 2015-12-11 DIAGNOSIS — K219 Gastro-esophageal reflux disease without esophagitis: Secondary | ICD-10-CM

## 2015-12-11 DIAGNOSIS — J309 Allergic rhinitis, unspecified: Secondary | ICD-10-CM | POA: Diagnosis not present

## 2015-12-11 DIAGNOSIS — J4540 Moderate persistent asthma, uncomplicated: Secondary | ICD-10-CM | POA: Diagnosis not present

## 2015-12-11 MED ORDER — OMALIZUMAB 150 MG ~~LOC~~ SOLR
150.0000 mg | SUBCUTANEOUS | Status: DC
  Administered 2015-12-11 – 2018-07-25 (×32): 150 mg via SUBCUTANEOUS

## 2015-12-11 NOTE — Patient Instructions (Signed)
  1. Continue Advair 230 two inhalations two times per day  2. Attempt to stop Qvar 80. Restart at two inhalations twice a day during increased asthma activity.  3. Continue Zegerid 40 mg daily  4. Continue Qnasl one puff each nostril daily  5. Continue Xolair and EpiPen  6. Return to clinic in 6 months or earlier if problem  7. Get a fall flu vaccine

## 2015-12-11 NOTE — Progress Notes (Signed)
Follow-up Note  Referring Provider: Darreld Mclean, MD Primary Provider: Lamar Blinks, MD Date of Office Visit: 12/11/2015  Subjective:   Ashley Villarreal (DOB: December 14, 1955) is a 61 y.o. female who returns to the [place] on 12/11/2015 in re-evaluation of the following:  HPI: Ashley Villarreal presents to this clinic in reevaluation of her asthma and allergic rhinoconjunctivitis and LPR. I've not seen her in his clinic since December 2016.  During the interval she has done quite well with her asthma and has not required a systemic steroids for an exacerbation and rarely uses a short acting bronchodilator and walks 5 miles per day without any difficulty while continuing to use Xolair and a combination of Advair and Qvar. In fact, she has had a 12 month interval without an exacerbation.  Her nose has been doing quite well and she has not had to use an antibiotic for an episode of sinusitis. She continues on her Qnasl  Her reflux and her throat or doing very well on her current medical therapy which includes Zegerid.    Medication List           ALPHAQUIN HP 4 % Crea  APPLY TO DARK SPOTS DAILY.     beclomethasone 80 MCG/ACT inhaler  Commonly known as:  QVAR  Inhale 2 puffs into the lungs 2 (two) times daily.     EPINEPHrine 0.3 mg/0.3 mL Soaj injection  Commonly known as:  EPIPEN 2-PAK  Inject 0.3 mLs (0.3 mg total) into the muscle once.     fluticasone 50 MCG/ACT nasal spray  Commonly known as:  FLONASE  Place 2 sprays into both nostrils daily.     fluticasone-salmeterol 230-21 MCG/ACT inhaler  Commonly known as:  ADVAIR HFA  Inhale 2 puffs into the lungs 2 (two) times daily.     ipratropium-albuterol 0.5-2.5 (3) MG/3ML Soln  Commonly known as:  DUONEB  Take 3 mLs by nebulization every 4 (four) hours as needed.     levalbuterol 45 MCG/ACT inhaler  Commonly known as:  XOPENEX HFA  Inhale 1-2 puffs into the lungs every 4 (four) hours as needed.     losartan 100 MG tablet  Commonly known as:  COZAAR  TAKE 1 TABLET (100 MG TOTAL) BY MOUTH DAILY.     metFORMIN 500 MG tablet  Commonly known as:  GLUCOPHAGE  TAKE 1 TABLET (500 MG TOTAL) BY MOUTH 2 (TWO) TIMES DAILY WITH A MEAL.     omalizumab 150 MG injection  Commonly known as:  XOLAIR  Inject 150 mg into the skin every 28 (twenty-eight) days.     omeprazole-sodium bicarbonate 40-1100 MG capsule  Commonly known as:  ZEGERID  Take 1 capsule by mouth daily before breakfast.     ONE TOUCH ULTRA TEST test strip  Generic drug:  glucose blood  PLEASE CHECK BLOOD SUGAR DAILY. DX CODE: E11.9     simvastatin 20 MG tablet  Commonly known as:  ZOCOR  Take one daily for cholesterol        Past Medical History  Diagnosis Date  . Allergy   . Diabetes mellitus without complication ([place])   . Asthma   . Hypertension   . COPD (chronic obstructive pulmonary disease) [facility])     Past Surgical History  Procedure Laterality Date  . Tonsillectomy  1975    Allergies  Allergen Reactions  . Claritin [Loratadine]     dizziness  . Doxycycline Nausea Only  . Other Other (See Comments)  Magic mouthwash  Patient get blisters on her lips    Review of systems negative except as noted in HPI / PMHx or noted below:  Review of Systems  Constitutional: Negative.   HENT: Negative.   Eyes: Negative.   Respiratory: Negative.   Cardiovascular: Negative.   Gastrointestinal: Negative.   Genitourinary: Negative.   Musculoskeletal: Negative.   Skin: Negative.   Neurological: Negative.   Endo/Heme/Allergies: Negative.   Psychiatric/Behavioral: Negative.      Objective:   Filed Vitals:   12/11/15 1118  BP: 140/80  Pulse: 80  Resp: 16          Physical Exam  Constitutional: She is well-developed, well-nourished, and in no distress.  HENT:  Head: Normocephalic.  Right Ear: Tympanic membrane, external ear and ear canal normal.  Left Ear: Tympanic membrane, external ear and ear  canal normal.  Nose: Nose normal. No mucosal edema or rhinorrhea.  Mouth/Throat: Uvula is midline, oropharynx is clear and moist and mucous membranes are normal. No oropharyngeal exudate.  Eyes: Conjunctivae are normal.  Neck: Trachea normal. No tracheal tenderness present. No tracheal deviation present. No thyromegaly present.  Cardiovascular: Normal rate, regular rhythm, S1 normal, S2 normal and normal heart sounds.   No murmur heard. Pulmonary/Chest: Breath sounds normal. No stridor. No respiratory distress. She has no wheezes. She has no rales.  Musculoskeletal: She exhibits no edema.  Lymphadenopathy:       Head (right side): No tonsillar adenopathy present.       Head (left side): No tonsillar adenopathy present.    She has no cervical adenopathy.  Neurological: She is alert. Gait normal.  Skin: No rash noted. She is not diaphoretic. No erythema. Nails show no clubbing.  Psychiatric: Mood and affect normal.    Diagnostics:    Spirometry was performed and demonstrated an FEV1 of 1.80 at 75 % of predicted.  The patient had an Asthma Control Test with the following results:  .    Assessment and Plan:   1. Allergic rhinoconjunctivitis   2. Severe persistent asthma, uncomplicated   3. LPRD (laryngopharyngeal reflux disease)     1. Continue Advair 230 two inhalations two times per day  2. Attempt to stop Qvar 80. Restart at two inhalations twice a day during increased asthma activity.  3. Continue Zegerid 40 mg daily  4. Continue Qnasl one puff each nostril daily  5. Continue Xolair and EpiPen  6. Return to clinic in 6 months or earlier if problem  7. Get a fall flu vaccine  [name] has really done quite well on her current plan and we'll see if we can consolidate some of her treatment by discontinuing her Qvar at this point in time while she continues to use Advair and Xolair consistently. I will see her back in this clinic in approximately 6 months or earlier if there is  a problem.  Allena Katz, MD [place]

## 2015-12-16 ENCOUNTER — Encounter: Payer: Self-pay | Admitting: Internal Medicine

## 2016-01-08 ENCOUNTER — Ambulatory Visit (INDEPENDENT_AMBULATORY_CARE_PROVIDER_SITE_OTHER): Payer: 59

## 2016-01-08 DIAGNOSIS — J4540 Moderate persistent asthma, uncomplicated: Secondary | ICD-10-CM

## 2016-02-05 ENCOUNTER — Ambulatory Visit (INDEPENDENT_AMBULATORY_CARE_PROVIDER_SITE_OTHER): Payer: 59

## 2016-02-05 DIAGNOSIS — J4540 Moderate persistent asthma, uncomplicated: Secondary | ICD-10-CM | POA: Diagnosis not present

## 2016-03-02 ENCOUNTER — Other Ambulatory Visit: Payer: Self-pay | Admitting: Allergy and Immunology

## 2016-03-04 ENCOUNTER — Ambulatory Visit: Payer: 59

## 2016-03-04 ENCOUNTER — Ambulatory Visit (INDEPENDENT_AMBULATORY_CARE_PROVIDER_SITE_OTHER): Payer: 59 | Admitting: *Deleted

## 2016-03-04 DIAGNOSIS — J4540 Moderate persistent asthma, uncomplicated: Secondary | ICD-10-CM | POA: Diagnosis not present

## 2016-04-02 ENCOUNTER — Ambulatory Visit (INDEPENDENT_AMBULATORY_CARE_PROVIDER_SITE_OTHER): Payer: 59 | Admitting: *Deleted

## 2016-04-02 DIAGNOSIS — J4540 Moderate persistent asthma, uncomplicated: Secondary | ICD-10-CM

## 2016-04-10 ENCOUNTER — Other Ambulatory Visit: Payer: Self-pay | Admitting: Obstetrics & Gynecology

## 2016-04-10 DIAGNOSIS — Z1231 Encounter for screening mammogram for malignant neoplasm of breast: Secondary | ICD-10-CM

## 2016-04-13 LAB — HM DIABETES EYE EXAM

## 2016-04-24 ENCOUNTER — Other Ambulatory Visit: Payer: Self-pay | Admitting: Physician Assistant

## 2016-04-24 DIAGNOSIS — E785 Hyperlipidemia, unspecified: Secondary | ICD-10-CM

## 2016-04-27 ENCOUNTER — Other Ambulatory Visit: Payer: Self-pay | Admitting: Family Medicine

## 2016-04-27 ENCOUNTER — Other Ambulatory Visit: Payer: Self-pay | Admitting: Emergency Medicine

## 2016-04-27 DIAGNOSIS — E785 Hyperlipidemia, unspecified: Secondary | ICD-10-CM

## 2016-04-27 MED ORDER — SIMVASTATIN 20 MG PO TABS: ORAL_TABLET | ORAL | 1 refills | Status: DC

## 2016-04-28 ENCOUNTER — Other Ambulatory Visit: Payer: Self-pay | Admitting: *Deleted

## 2016-04-28 MED ORDER — FLUTICASONE-SALMETEROL 230-21 MCG/ACT IN AERO: INHALATION_SPRAY | RESPIRATORY_TRACT | 0 refills | Status: DC

## 2016-04-29 ENCOUNTER — Other Ambulatory Visit: Payer: Self-pay | Admitting: Emergency Medicine

## 2016-04-29 DIAGNOSIS — H9190 Unspecified hearing loss, unspecified ear: Secondary | ICD-10-CM

## 2016-04-29 MED ORDER — FLUTICASONE PROPIONATE 50 MCG/ACT NA SUSP: 2.0000 | Freq: Every day | NASAL | 6 refills | Status: DC

## 2016-04-30 ENCOUNTER — Ambulatory Visit (INDEPENDENT_AMBULATORY_CARE_PROVIDER_SITE_OTHER): Payer: 59 | Admitting: *Deleted

## 2016-04-30 DIAGNOSIS — J4540 Moderate persistent asthma, uncomplicated: Secondary | ICD-10-CM

## 2016-05-06 ENCOUNTER — Ambulatory Visit (INDEPENDENT_AMBULATORY_CARE_PROVIDER_SITE_OTHER): Payer: 59 | Admitting: Family Medicine

## 2016-05-06 ENCOUNTER — Encounter: Payer: Self-pay | Admitting: Family Medicine

## 2016-05-06 VITALS — BP 126/80 | HR 76 | Temp 97.8°F | Ht 69.0 in | Wt 150.0 lb

## 2016-05-06 DIAGNOSIS — Z23 Encounter for immunization: Secondary | ICD-10-CM

## 2016-05-06 DIAGNOSIS — E785 Hyperlipidemia, unspecified: Secondary | ICD-10-CM | POA: Diagnosis not present

## 2016-05-06 DIAGNOSIS — Z1211 Encounter for screening for malignant neoplasm of colon: Secondary | ICD-10-CM

## 2016-05-06 DIAGNOSIS — E1121 Type 2 diabetes mellitus with diabetic nephropathy: Secondary | ICD-10-CM

## 2016-05-06 DIAGNOSIS — I10 Essential (primary) hypertension: Secondary | ICD-10-CM | POA: Diagnosis not present

## 2016-05-06 DIAGNOSIS — N289 Disorder of kidney and ureter, unspecified: Secondary | ICD-10-CM | POA: Diagnosis not present

## 2016-05-06 DIAGNOSIS — D649 Anemia, unspecified: Secondary | ICD-10-CM | POA: Diagnosis not present

## 2016-05-06 LAB — CBC
HEMATOCRIT: 35.3 % — AB (ref 36.0–46.0)
Hemoglobin: 11.5 g/dL — ABNORMAL LOW (ref 12.0–15.0)
MCHC: 32.5 g/dL (ref 30.0–36.0)
MCV: 82.5 fl (ref 78.0–100.0)
Platelets: 259 10*3/uL (ref 150.0–400.0)
RBC: 4.28 Mil/uL (ref 3.87–5.11)
RDW: 14.6 % (ref 11.5–15.5)
WBC: 7 10*3/uL (ref 4.0–10.5)

## 2016-05-06 LAB — BASIC METABOLIC PANEL
BUN: 22 mg/dL (ref 6–23)
CALCIUM: 10 mg/dL (ref 8.4–10.5)
CHLORIDE: 103 meq/L (ref 96–112)
CO2: 32 meq/L (ref 19–32)
Creatinine, Ser: 1.27 mg/dL — ABNORMAL HIGH (ref 0.40–1.20)
GFR: 55.37 mL/min — ABNORMAL LOW (ref >60.00)
GLUCOSE: 88 mg/dL (ref 70–99)
POTASSIUM: 3.9 meq/L (ref 3.5–5.1)
SODIUM: 142 meq/L (ref 135–145)

## 2016-05-06 LAB — HEMOGLOBIN A1C: Hgb A1c MFr Bld: 6 % (ref 4.6–6.5)

## 2016-05-06 MED ORDER — SIMVASTATIN 20 MG PO TABS: ORAL_TABLET | ORAL | 3 refills | Status: DC

## 2016-05-06 MED ORDER — LOSARTAN POTASSIUM 100 MG PO TABS: ORAL_TABLET | ORAL | 3 refills | Status: DC

## 2016-05-06 NOTE — Patient Instructions (Signed)
It was very nice to see you today- I will be in touch with your labs You got your flu shot today Keep up the good with with your exercise plan! I am going to refer you to a Gastroenterology (GI) doctor to update your colonoscopy  We can plan for a physical in about 6 months

## 2016-05-06 NOTE — Progress Notes (Signed)
[place] [facility] [address] [place], [place] [address] [phone]  Date:  05/06/2016   Name:  KIERAN HOWDYSHELL   DOB:  14-Mar-1956   MRN:  [id number]  PCP:  Lamar Blinks, MD    Chief Complaint: No chief complaint on file.   History of Present Illness:  Ashley Villarreal is a 60 y.o. very pleasant female patient who presents with the following:  Here today for a periodic BP and DM check- last seen in May, partial HPI from that visit  She is here today for a CPE.  She is getting allergy shots to help with her asthma- this is helping her already.  She does see OB-GYN for her pap but I do her clinical breast exam She is sleeping well with her CPAP- she is feeling well rested.   She is checking her BP twice a day and getting really good numbers at home-she brings in a list with her today BP ranging 121- 136/ 64- 77 with multiple data points DM is well controlled Due for a CBC and A1c today  Recent Labs       Lab Results  Component Value Date   HGBA1C 6.1* 08/03/2015     She is walking 5 miles a day and keeping track with her fitbit. She has lost significant weight!      Wt Readings from Last 3 Encounters:  11/04/15 148 lb 3.2 oz (67.223 kg)  08/03/15 159 lb 3.2 oz (72.213 kg)  05/27/15 169 lb (76.658 kg)   Overall she feels great and has no other concerns today  She is due for a flu shot, eye exam, A1c today She did have an eye exam last month- Dr. Launa Flight. She got new glasses also noted anemia at her last visit- her last colonoscopy was in early 2009 per her recollection.   I had encouraged her to see GI for possible colonoscopy; however she did see rheumatology on the advice of her nephrologist- and they rechecked her blood level, they advised her that she was not anemic She has not seen any blood in her stool.  She would like to go ahead and update her colonoscopy but will need a referral for same.    Mammo was about one year ago She is feeling well. She is still walking about 6 miles a day for exercise and enjoying tihs Her glucose is generally very good and less than 100 in the am fasting.  She does check it once daily  She normally takes her BP med in the morning and did not take it yet today She is retired from work and is enjoying this She also went to her 40 year HS reunion recently and this was a lot of fun Her family his doing well    Wt Readings from Last 3 Encounters:  05/06/16 150 lb (68 kg)  11/27/15 149 lb 12.8 oz (67.9 kg)  11/04/15 148 lb 3.2 oz (67.2 kg)   BP Readings from Last 3 Encounters:  05/06/16 (!) 166/82  12/11/15 140/80  11/27/15 134/80    Patient Active Problem List   Diagnosis Date Noted  . Asthma, severe persistent, well-controlled 06/12/2015  . LPRD (laryngopharyngeal reflux disease) 06/12/2015  . Allergic rhinoconjunctivitis 06/12/2015  . Allergy with anaphylaxis due to food 06/12/2015  . Elevated serum creatinine 05/01/2015  . Obstructive sleep apnea   . Laryngopharyngeal reflux 02/23/2015  . Hyperlipemia 08/10/2011  . Asthma with  acute exacerbation 08/10/2011  . Hypertensive heart disease   . Diabetes mellitus type 2, noninsulin dependent ([place])     Past Medical History:  Diagnosis Date  . Allergy   . Asthma   . COPD (chronic obstructive pulmonary disease) ([place])   . Diabetes mellitus without complication ([place])   . Hypertension     Past Surgical History:  Procedure Laterality Date  . TONSILLECTOMY  1975    Social History  Substance Use Topics  . Smoking status: Never Smoker  . Smokeless tobacco: Not on file  . Alcohol use No    Family History  Problem Relation Age of Onset  . Stroke Father   . Heart disease Father   . Diabetes Mother   . Heart disease Mother   . Stroke Mother   . Hyperlipidemia Sister   . Hyperlipidemia Brother   . Diabetes Paternal Grandmother   . Hypertension Brother     Allergies  Allergen  Reactions  . Claritin [Loratadine]     dizziness  . Doxycycline Nausea Only  . Other Other (See Comments)    Magic mouthwash  Patient get blisters on her lips    Medication list has been reviewed and updated.  Current Outpatient Prescriptions on File Prior to Visit  Medication Sig Dispense Refill  . beclomethasone (QVAR) 80 MCG/ACT inhaler Inhale 2 puffs into the lungs 2 (two) times daily. 1 Inhaler 5  . EPINEPHrine (EPIPEN 2-PAK) 0.3 mg/0.3 mL IJ SOAJ injection Inject 0.3 mLs (0.3 mg total) into the muscle once. 2 Device 1  . fluticasone (FLONASE) 50 MCG/ACT nasal spray Place 2 sprays into both nostrils daily. 16 g 6  . fluticasone-salmeterol (ADVAIR HFA) 230-21 MCG/ACT inhaler INHALE 2 PUFFS INTO THE LUNGS 2 TIMES DAILY 12 Inhaler 0  . Hydroquinone-Sunscreens (ALPHAQUIN HP) 4 % CREA APPLY TO DARK SPOTS DAILY.  3  . ipratropium-albuterol (DUONEB) 0.5-2.5 (3) MG/3ML SOLN Take 3 mLs by nebulization every 4 (four) hours as needed. (Patient not taking: Reported on 12/11/2015) 120 mL 2  . levalbuterol (XOPENEX HFA) 45 MCG/ACT inhaler Inhale 1-2 puffs into the lungs every 4 (four) hours as needed. 3 Inhaler 3  . losartan (COZAAR) 100 MG tablet TAKE 1 TABLET (100 MG TOTAL) BY MOUTH DAILY. 90 tablet 3  . metFORMIN (GLUCOPHAGE) 500 MG tablet TAKE 1 TABLET (500 MG TOTAL) BY MOUTH 2 (TWO) TIMES DAILY WITH A MEAL. 180 tablet 3  . omalizumab (XOLAIR) 150 MG injection Inject 150 mg into the skin every 28 (twenty-eight) days. 1 each 11  . omeprazole-sodium bicarbonate (ZEGERID) 40-1100 MG capsule Take 1 capsule by mouth daily before breakfast. 30 capsule 0  . ONE TOUCH ULTRA TEST test strip PLEASE CHECK BLOOD SUGAR DAILY. DX CODE: E11.9 100 each 3  . simvastatin (ZOCOR) 20 MG tablet Take one daily for cholesterol 90 tablet 1   Current Facility-Administered Medications on File Prior to Visit  Medication Dose Route Frequency Provider Last Rate Last Dose  . omalizumab Arvid Right) injection 150 mg  150 mg  Subcutaneous Q28 days Jiles Prows, MD   150 mg at 04/30/16 1014    Review of Systems:  As per HPI- otherwise negative. No fever, chills, CP, SOB  Physical Examination: Blood pressure (!) 166/82, pulse 76, temperature 97.8 F (36.6 C), temperature source Oral, height 5\' 9"  (1.753 m), weight 150 lb (68 kg), SpO2 97 %. Ideal Body Weight:    GEN: WDWN, NAD, Non-toxic, A & O x 3, normal weight, looks well HEENT: Atraumatic,  Normocephalic. Neck supple. No masses, No LAD.  Bilateral TM wnl, oropharynx normal.  PEERL,EOMI.   Ears and Nose: No external deformity. CV: RRR, No M/G/R. No JVD. No thrill. No extra heart sounds. PULM: CTA B, no wheezes, crackles, rhonchi. No retractions. No resp. distress. No accessory muscle use. EXTR: No c/c/e NEURO Normal gait.  PSYCH: Normally interactive. Conversant. Not depressed or anxious appearing.  Calm demeanor.   Body mass index is 22.15 kg/m.  Assessment and Plan: Controlled type 2 diabetes mellitus with diabetic nephropathy, without long-term current use of insulin ([place]) - Plan: Basic metabolic panel, Hemoglobin A1C  Essential hypertension - Plan: losartan (COZAAR) 100 MG tablet  Hyperlipidemia, unspecified hyperlipidemia type - Plan: simvastatin (ZOCOR) 20 MG tablet  Renal insufficiency - Plan: Basic metabolic panel  Screening for colon cancer - Plan: simvastatin (ZOCOR) 20 MG tablet, Ambulatory referral to Gastroenterology  Immunization due  Low hemoglobin - Plan: CBC  Here today for a periodic recheck Flu shot today, periodic labs Her BP is under good control, she is getting plenty of exercise Referral to GI to catch up on colonoscopy She is UTD on her pneumonia vaccine planfor CPE in 6 months pending her labs    Signed Lamar Blinks, MD

## 2016-05-06 NOTE — Progress Notes (Signed)
Pre visit review using our clinic review tool, if applicable. No additional management support is needed unless otherwise documented below in the visit note. 

## 2016-05-07 ENCOUNTER — Encounter: Payer: Self-pay | Admitting: Gastroenterology

## 2016-05-20 ENCOUNTER — Ambulatory Visit
Admission: RE | Admit: 2016-05-20 | Discharge: 2016-05-20 | Disposition: A | Discharge status: Home / Self Care | Payer: 59 | Source: Ambulatory Visit | Attending: Obstetrics & Gynecology | Admitting: Obstetrics & Gynecology

## 2016-05-20 DIAGNOSIS — Z1231 Encounter for screening mammogram for malignant neoplasm of breast: Secondary | ICD-10-CM

## 2016-05-28 ENCOUNTER — Ambulatory Visit (INDEPENDENT_AMBULATORY_CARE_PROVIDER_SITE_OTHER): Payer: 59 | Admitting: *Deleted

## 2016-05-28 DIAGNOSIS — J4540 Moderate persistent asthma, uncomplicated: Secondary | ICD-10-CM

## 2016-06-10 ENCOUNTER — Encounter: Payer: Self-pay | Admitting: Allergy and Immunology

## 2016-06-10 ENCOUNTER — Ambulatory Visit (INDEPENDENT_AMBULATORY_CARE_PROVIDER_SITE_OTHER): Payer: 59 | Admitting: Allergy and Immunology

## 2016-06-10 VITALS — BP 158/72 | HR 88 | Resp 18

## 2016-06-10 DIAGNOSIS — H6983 Other specified disorders of Eustachian tube, bilateral: Secondary | ICD-10-CM | POA: Diagnosis not present

## 2016-06-10 DIAGNOSIS — J3089 Other allergic rhinitis: Secondary | ICD-10-CM | POA: Diagnosis not present

## 2016-06-10 DIAGNOSIS — J4550 Severe persistent asthma, uncomplicated: Secondary | ICD-10-CM | POA: Diagnosis not present

## 2016-06-10 DIAGNOSIS — G4733 Obstructive sleep apnea (adult) (pediatric): Secondary | ICD-10-CM

## 2016-06-10 DIAGNOSIS — K219 Gastro-esophageal reflux disease without esophagitis: Secondary | ICD-10-CM

## 2016-06-10 DIAGNOSIS — J4520 Mild intermittent asthma, uncomplicated: Secondary | ICD-10-CM

## 2016-06-10 MED ORDER — MONTELUKAST SODIUM 10 MG PO TABS: ORAL_TABLET | ORAL | 5 refills | Status: DC

## 2016-06-10 MED ORDER — FLUTICASONE-SALMETEROL 230-21 MCG/ACT IN AERO: INHALATION_SPRAY | RESPIRATORY_TRACT | 1 refills | Status: DC

## 2016-06-10 MED ORDER — LEVALBUTEROL TARTRATE 45 MCG/ACT IN AERO: INHALATION_SPRAY | RESPIRATORY_TRACT | 0 refills | Status: DC

## 2016-06-10 MED ORDER — OMEPRAZOLE-SODIUM BICARBONATE 40-1100 MG PO CAPS: 1.0000 | ORAL_CAPSULE | Freq: Every day | ORAL | 1 refills | Status: DC

## 2016-06-10 NOTE — Patient Instructions (Addendum)
  1. Continue Advair 230 two inhalations two times per day  2. Restart Qvar 80 two inhalations twice a day during increased asthma activity.  3. Continue Zegerid 40 mg daily  4. Consistently use Flonase one spray each nostril once a day  5. Start montelukast 10 mg tablet 1 time per day  6. buffer CPAP solution with quarter teaspoon salt plus quarter teaspoon baking soda per 8 ounces of distilled water  7. Continue Xolair and EpiPen  8. Return to clinic in 6 months or earlier if problem  9. May need to revisit with Dr. Constance Holster if still with ear problem.

## 2016-06-10 NOTE — Progress Notes (Signed)
Follow-up Note  Referring Provider: Darreld Mclean, MD Primary Provider: Lamar Blinks, MD Date of Office Visit: 06/10/2016  Subjective:   Ashley Villarreal (DOB: 06-18-1956) is a 60 y.o. female who returns to the [facility] on 06/10/2016 in re-evaluation of the following:  HPI: Ashley Villarreal returns to this clinic in reevaluation of her asthma and allergic rhinoconjunctivitis treated with Xolair and LPR. I've not seen her in his clinic since June 2017.  Her asthma has been under excellent control and she has not required a systemic steroid to treat an exacerbation and rarely uses a short acting bronchodilator and can exercise without any problem. Currently she is using a combination of Xolair and Advair and no longer uses any Qvar.  Her nose has been doing quite well but she does get somewhat congested while using her CPAP machine at nighttime. As well, she thinks that her hearing is not as good as it has been in the past. She thinks that her ears are somewhat full. She is not required an antibiotic to treat an episode of sinusitis. She does not use her nasal steroid very consistently.  She has had very little problem with her reflux. She continues on treatment.  Allergies as of 06/10/2016      Reactions   Claritin [loratadine]    dizziness   Doxycycline Nausea Only   Other Other (See Comments)   Magic mouthwash  Patient get blisters on her lips      Medication List      ALPHAQUIN HP 4 % Crea APPLY TO DARK SPOTS DAILY.   beclomethasone 80 MCG/ACT inhaler Commonly known as:  QVAR Inhale 2 puffs into the lungs 2 (two) times daily.   EPINEPHrine 0.3 mg/0.3 mL Soaj injection Commonly known as:  EPIPEN 2-PAK Inject 0.3 mLs (0.3 mg total) into the muscle once.   fluticasone 50 MCG/ACT nasal spray Commonly known as:  FLONASE Place 2 sprays into both nostrils daily.   fluticasone-salmeterol 230-21 MCG/ACT inhaler Commonly known as:  ADVAIR HFA INHALE 2  PUFFS INTO THE LUNGS 2 TIMES DAILY   ipratropium-albuterol 0.5-2.5 (3) MG/3ML Soln Commonly known as:  DUONEB Take 3 mLs by nebulization every 4 (four) hours as needed.   levalbuterol 45 MCG/ACT inhaler Commonly known as:  XOPENEX HFA Inhale 1-2 puffs into the lungs every 4 (four) hours as needed.   losartan 100 MG tablet Commonly known as:  COZAAR TAKE 1 TABLET (100 MG TOTAL) BY MOUTH DAILY.   metFORMIN 500 MG tablet Commonly known as:  GLUCOPHAGE TAKE 1 TABLET (500 MG TOTAL) BY MOUTH 2 (TWO) TIMES DAILY WITH A MEAL.   omalizumab 150 MG injection Commonly known as:  XOLAIR Inject 150 mg into the skin every 28 (twenty-eight) days.   omeprazole-sodium bicarbonate 40-1100 MG capsule Commonly known as:  ZEGERID Take 1 capsule by mouth daily before breakfast.   ONE TOUCH ULTRA TEST test strip Generic drug:  glucose blood PLEASE CHECK BLOOD SUGAR DAILY. DX CODE: E11.9   simvastatin 20 MG tablet Commonly known as:  ZOCOR Take one daily for cholesterol       Past Medical History:  Diagnosis Date  . Allergy   . Asthma   . COPD (chronic obstructive pulmonary disease) ([place])   . Diabetes mellitus without complication ([place])   . Hypertension     Past Surgical History:  Procedure Laterality Date  . TONSILLECTOMY  1975    Review of systems negative except as noted in HPI /  PMHx or noted below:  Review of Systems  Constitutional: Negative.   HENT: Negative.   Eyes: Negative.   Respiratory: Negative.   Cardiovascular: Negative.   Gastrointestinal: Negative.   Genitourinary: Negative.   Musculoskeletal: Negative.   Skin: Negative.   Neurological: Negative.   Endo/Heme/Allergies: Negative.   Psychiatric/Behavioral: Negative.      Objective:   Vitals:   06/10/16 1015  BP: (!) 158/72  Pulse: 88  Resp: 18          Physical Exam  Constitutional: She is well-developed, well-nourished, and in no distress.  HENT:  Head: Normocephalic.  Right Ear: Tympanic  membrane, external ear and ear canal normal. Right ear middle ear effusion: dull light reflex.  Left Ear: Tympanic membrane, external ear and ear canal normal. Left ear middle ear effusion:  dull light reflex.  Nose: Nose normal. No mucosal edema or rhinorrhea.  Mouth/Throat: Uvula is midline, oropharynx is clear and moist and mucous membranes are normal. No oropharyngeal exudate.  Eyes: Conjunctivae are normal.  Neck: Trachea normal. No tracheal tenderness present. No tracheal deviation present. No thyromegaly present.  Cardiovascular: Normal rate, regular rhythm, S1 normal, S2 normal and normal heart sounds.   No murmur heard. Pulmonary/Chest: Breath sounds normal. No stridor. No respiratory distress. She has no wheezes. She has no rales.  Musculoskeletal: She exhibits no edema.  Lymphadenopathy:       Head (right side): No tonsillar adenopathy present.       Head (left side): No tonsillar adenopathy present.    She has no cervical adenopathy.  Neurological: She is alert. Gait normal.  Skin: No rash noted. She is not diaphoretic. No erythema. Nails show no clubbing.  Psychiatric: Mood and affect normal.    Diagnostics:    Spirometry was performed and demonstrated an FEV1 of 1.80 at 70 % of predicted..    Assessment and Plan:   1. Severe persistent asthma, uncomplicated   2. Other allergic rhinitis   3. ETD (Eustachian tube dysfunction), bilateral   4. Gastroesophageal reflux disease without esophagitis   5. Sleep apnea, obstructive     1. Continue Advair 230 two inhalations two times per day  2. Restart Qvar 80 two inhalations twice a day during increased asthma activity.  3. Continue Zegerid 40 mg daily  4. Consistently use Flonase one spray each nostril once a day  5. Start montelukast 10 mg tablet 1 time per day  6. Buffer CPAP solution with quarter teaspoon salt plus quarter teaspoon baking soda per 8 ounces of distilled water  7. Continue Xolair and EpiPen  8.  Return to clinic in 6 months or earlier if problem  9. May need to revisit with Dr. Constance Holster if still with ear problem.  Ashley Villarreal will continue to use anti-inflammatory medications including the administration of omalizumab to treat her atopic respiratory disease as specified above. She will also continue to treat reflux with Zegerid. I think that she probably has some ETD and we will get her to be a little bit more consistent about using her Flonase and starting montelukast and we are also going to buffer her CPAP solution in ann attempt to eliminate the significant nasal congestion and suspected ETD that appears to be developing at nighttime while using her CPAP machine. If she still continues to have problems with hearing or fullness of her ears I told her that she probably needs to go see Dr. Constance Holster once again as he was the ENT doctor who performed her sinus  surgery. If she does well I will see her back in this clinic in 6 months or earlier if there is a problem.  Allena Katz, MD [place]

## 2016-06-11 ENCOUNTER — Encounter: Payer: Self-pay | Admitting: Allergy and Immunology

## 2016-06-24 DIAGNOSIS — G4733 Obstructive sleep apnea (adult) (pediatric): Secondary | ICD-10-CM | POA: Diagnosis not present

## 2016-06-25 ENCOUNTER — Ambulatory Visit (INDEPENDENT_AMBULATORY_CARE_PROVIDER_SITE_OTHER): Payer: 59

## 2016-06-25 ENCOUNTER — Ambulatory Visit: Payer: 59

## 2016-06-25 DIAGNOSIS — J4550 Severe persistent asthma, uncomplicated: Secondary | ICD-10-CM

## 2016-06-25 DIAGNOSIS — J4540 Moderate persistent asthma, uncomplicated: Secondary | ICD-10-CM | POA: Diagnosis not present

## 2016-06-25 NOTE — Progress Notes (Signed)
This encounter was created in error - please disregard.

## 2016-07-08 DIAGNOSIS — G4733 Obstructive sleep apnea (adult) (pediatric): Secondary | ICD-10-CM | POA: Diagnosis not present

## 2016-07-16 ENCOUNTER — Ambulatory Visit (AMBULATORY_SURGERY_CENTER): Payer: Self-pay

## 2016-07-16 VITALS — Ht 68.0 in | Wt 157.4 lb

## 2016-07-16 DIAGNOSIS — Z1211 Encounter for screening for malignant neoplasm of colon: Secondary | ICD-10-CM

## 2016-07-16 NOTE — Progress Notes (Signed)
No allergies to eggs or soy No past problems with anesthesia No diet meds No home oxygen  Declined emmi  Sleep apnea-CPAP

## 2016-07-17 ENCOUNTER — Encounter: Payer: Self-pay | Admitting: Gastroenterology

## 2016-07-20 ENCOUNTER — Telehealth: Payer: Self-pay | Admitting: Gastroenterology

## 2016-07-20 DIAGNOSIS — Z1211 Encounter for screening for malignant neoplasm of colon: Secondary | ICD-10-CM

## 2016-07-20 MED ORDER — NA SULFATE-K SULFATE-MG SULF 17.5-3.13-1.6 GM/177ML PO SOLN: ORAL | 0 refills | Status: DC

## 2016-07-20 NOTE — Telephone Encounter (Signed)
Sent Sup[facility] to [facility] as requested by patient. Called pt, no answer left message.

## 2016-07-22 DIAGNOSIS — N189 Chronic kidney disease, unspecified: Secondary | ICD-10-CM | POA: Diagnosis not present

## 2016-07-22 DIAGNOSIS — D631 Anemia in chronic kidney disease: Secondary | ICD-10-CM | POA: Diagnosis not present

## 2016-07-22 DIAGNOSIS — N183 Chronic kidney disease, stage 3 (moderate): Secondary | ICD-10-CM | POA: Diagnosis not present

## 2016-07-22 DIAGNOSIS — R801 Persistent proteinuria, unspecified: Secondary | ICD-10-CM | POA: Diagnosis not present

## 2016-07-22 DIAGNOSIS — I129 Hypertensive chronic kidney disease with stage 1 through stage 4 chronic kidney disease, or unspecified chronic kidney disease: Secondary | ICD-10-CM | POA: Diagnosis not present

## 2016-07-23 ENCOUNTER — Encounter: Payer: 59 | Admitting: Gastroenterology

## 2016-07-24 ENCOUNTER — Ambulatory Visit: Payer: 59

## 2016-07-24 ENCOUNTER — Ambulatory Visit (INDEPENDENT_AMBULATORY_CARE_PROVIDER_SITE_OTHER): Payer: 59

## 2016-07-24 DIAGNOSIS — J4540 Moderate persistent asthma, uncomplicated: Secondary | ICD-10-CM

## 2016-07-30 ENCOUNTER — Encounter: Payer: Self-pay | Admitting: Gastroenterology

## 2016-07-30 ENCOUNTER — Ambulatory Visit (AMBULATORY_SURGERY_CENTER): Payer: 59 | Admitting: Gastroenterology

## 2016-07-30 VITALS — BP 145/72 | HR 78 | Temp 97.7°F | Resp 16 | Ht 68.0 in | Wt 157.0 lb

## 2016-07-30 DIAGNOSIS — D124 Benign neoplasm of descending colon: Secondary | ICD-10-CM

## 2016-07-30 DIAGNOSIS — D122 Benign neoplasm of ascending colon: Secondary | ICD-10-CM | POA: Diagnosis not present

## 2016-07-30 DIAGNOSIS — Z1211 Encounter for screening for malignant neoplasm of colon: Secondary | ICD-10-CM

## 2016-07-30 DIAGNOSIS — G4733 Obstructive sleep apnea (adult) (pediatric): Secondary | ICD-10-CM | POA: Diagnosis not present

## 2016-07-30 DIAGNOSIS — Z1212 Encounter for screening for malignant neoplasm of rectum: Secondary | ICD-10-CM

## 2016-07-30 DIAGNOSIS — I10 Essential (primary) hypertension: Secondary | ICD-10-CM | POA: Diagnosis not present

## 2016-07-30 DIAGNOSIS — K635 Polyp of colon: Secondary | ICD-10-CM

## 2016-07-30 MED ORDER — SODIUM CHLORIDE 0.9 % IV SOLN: 500.0000 mL | INTRAVENOUS | Status: DC

## 2016-07-30 NOTE — Op Note (Signed)
[facility] Patient Name: Ashley Villarreal Procedure Date: 07/30/2016 8:49 AM MRN: [id number] Endoscopist: Mauri Pole , MD Age: 61 Referring MD:  Date of Birth: Oct 21, 1955 Gender: Female Account #: [id number] Procedure:                Colonoscopy Indications:              Screening for colorectal malignant neoplasm Medicines:                Monitored Anesthesia Care Procedure:                Pre-Anesthesia Assessment:                           - Prior to the procedure, a History and Physical                            was performed, and patient medications and                            allergies were reviewed. The patient's tolerance of                            previous anesthesia was also reviewed. The risks                            and benefits of the procedure and the sedation                            options and risks were discussed with the patient.                            All questions were answered, and informed consent                            was obtained. Prior Anticoagulants: The patient has                            taken no previous anticoagulant or antiplatelet                            agents. ASA Grade Assessment: II - A patient with                            mild systemic disease. After reviewing the risks                            and benefits, the patient was deemed in                            satisfactory condition to undergo the procedure.                           After obtaining informed consent, the colonoscope  was passed under direct vision. Throughout the                            procedure, the patient's blood pressure, pulse, and                            oxygen saturations were monitored continuously. The                            Model CF-HQ190L [id number]) scope was introduced                            through the anus and advanced to the the terminal                            ileum, with  identification of the appendiceal                            orifice and IC valve. The colonoscopy was performed                            without difficulty. The patient tolerated the                            procedure well. The quality of the bowel                            preparation was excellent. The ileocecal valve,                            appendiceal orifice, and rectum were photographed. Scope In: 8:55:22 AM Scope Out: [id number] AM Scope Withdrawal Time: 0 hours 8 minutes 5 seconds  Total Procedure Duration: 0 hours 10 minutes 55 seconds  Findings:                 The perianal and digital rectal examinations were                            normal.                           A 2 mm polyp was found in the descending colon. The                            polyp was sessile. The polyp was removed with a                            cold biopsy forceps. Resection and retrieval were                            complete.                           Non-bleeding internal hemorrhoids were found during  retroflexion. The hemorrhoids were medium-sized.                           The exam was otherwise without abnormality. Complications:            No immediate complications. Estimated Blood Loss:     Estimated blood loss was minimal. Impression:               - One 2 mm polyp in the descending colon, removed                            with a cold biopsy forceps. Resected and retrieved.                           - Non-bleeding internal hemorrhoids.                           - The examination was otherwise normal. Recommendation:           - Patient has a contact number available for                            emergencies. The signs and symptoms of potential                            delayed complications were discussed with the                            patient. Return to normal activities tomorrow.                            Written discharge instructions were  provided to the                            patient.                           - Continue present medications.                           - Resume previous diet.                           - Repeat colonoscopy in 5-10 years for surveillance                            based on pathology results. Mauri Pole, MD 07/30/2016 9:12:06 AM This report has been signed electronically.

## 2016-07-30 NOTE — Progress Notes (Signed)
To recovery, report Michele Mcalpine, RN, VSS

## 2016-07-30 NOTE — Patient Instructions (Signed)
Impression/Recommendations:  Polyp handout given to patient. Hemorrhoid handout given to patient.  Repeat colonoscopy in 5-10 years.   Date to be determined based on pathology results.  YOU HAD AN ENDOSCOPIC PROCEDURE TODAY AT [facility]:   Refer to the procedure report that was given to you for any specific questions about what was found during the examination.  If the procedure report does not answer your questions, please call your gastroenterologist to clarify.  If you requested that your care partner not be given the details of your procedure findings, then the procedure report has been included in a sealed envelope for you to review at your convenience later.  YOU SHOULD EXPECT: Some feelings of bloating in the abdomen. Passage of more gas than usual.  Walking can help get rid of the air that was put into your GI tract during the procedure and reduce the bloating. If you had a lower endoscopy (such as a colonoscopy or flexible sigmoidoscopy) you may notice spotting of blood in your stool or on the toilet paper. If you underwent a bowel prep for your procedure, you may not have a normal bowel movement for a few days.  Please Note:  You might notice some irritation and congestion in your nose or some drainage.  This is from the oxygen used during your procedure.  There is no need for concern and it should clear up in a day or so.  SYMPTOMS TO REPORT IMMEDIATELY:   Following lower endoscopy (colonoscopy or flexible sigmoidoscopy):  Excessive amounts of blood in the stool  Significant tenderness or worsening of abdominal pains  Swelling of the abdomen that is new, acute  Fever of 100F or higher  For urgent or emergent issues, a gastroenterologist can be reached at any hour by calling [phone].   DIET:  We do recommend a small meal at first, but then you may proceed to your regular diet.  Drink plenty of fluids but you should avoid alcoholic beverages for 24  hours.  ACTIVITY:  You should plan to take it easy for the rest of today and you should NOT DRIVE or use heavy machinery until tomorrow (because of the sedation medicines used during the test).    FOLLOW UP: Our staff will call the number listed on your records the next business day following your procedure to check on you and address any questions or concerns that you may have regarding the information given to you following your procedure. If we do not reach you, we will leave a message.  However, if you are feeling well and you are not experiencing any problems, there is no need to return our call.  We will assume that you have returned to your regular daily activities without incident.  If any biopsies were taken you will be contacted by phone or by letter within the next 1-3 weeks.  Please call us at [phone] if you have not heard about the biopsies in 3 weeks.    SIGNATURES/CONFIDENTIALITY: You and/or your care partner have signed paperwork which will be entered into your electronic medical record.  These signatures attest to the fact that that the information above on your After Visit Summary has been reviewed and is understood.  Full responsibility of the confidentiality of this discharge information lies with you and/or your care-partner.

## 2016-07-31 ENCOUNTER — Telehealth: Payer: Self-pay | Admitting: *Deleted

## 2016-07-31 NOTE — Telephone Encounter (Signed)
  Follow up Call-  Call back number 07/30/2016  Post procedure Call Back phone  # [phone]  Permission to leave phone message Yes  Some recent data might be hidden     Patient questions:  Do you have a fever, pain , or abdominal swelling? No. Pain Score  0 *  Have you tolerated food without any problems? Yes.    Have you been able to return to your normal activities? Yes.    Do you have any questions about your discharge instructions: Diet   No. Medications  No. Follow up visit  No.  Do you have questions or concerns about your Care? No.  Actions: * If pain score is 4 or above: No action needed, pain <4.

## 2016-08-04 ENCOUNTER — Encounter: Payer: Self-pay | Admitting: Gastroenterology

## 2016-08-21 ENCOUNTER — Ambulatory Visit (INDEPENDENT_AMBULATORY_CARE_PROVIDER_SITE_OTHER): Payer: 59 | Admitting: *Deleted

## 2016-08-21 DIAGNOSIS — J4540 Moderate persistent asthma, uncomplicated: Secondary | ICD-10-CM

## 2016-09-03 ENCOUNTER — Other Ambulatory Visit: Payer: Self-pay | Admitting: Allergy and Immunology

## 2016-09-04 ENCOUNTER — Ambulatory Visit (INDEPENDENT_AMBULATORY_CARE_PROVIDER_SITE_OTHER): Payer: 59

## 2016-09-04 ENCOUNTER — Ambulatory Visit (INDEPENDENT_AMBULATORY_CARE_PROVIDER_SITE_OTHER): Payer: 59 | Admitting: Family Medicine

## 2016-09-04 VITALS — BP 158/64 | HR 92 | Temp 98.7°F | Resp 16 | Ht 68.0 in | Wt 159.6 lb

## 2016-09-04 DIAGNOSIS — M25441 Effusion, right hand: Secondary | ICD-10-CM

## 2016-09-04 DIAGNOSIS — S62614A Displaced fracture of proximal phalanx of right ring finger, initial encounter for closed fracture: Secondary | ICD-10-CM | POA: Diagnosis not present

## 2016-09-04 DIAGNOSIS — M79644 Pain in right finger(s): Secondary | ICD-10-CM | POA: Diagnosis not present

## 2016-09-04 MED ORDER — TRAMADOL HCL 50 MG PO TABS: 50.0000 mg | ORAL_TABLET | Freq: Four times a day (QID) | ORAL | 0 refills | Status: DC | PRN

## 2016-09-04 NOTE — Progress Notes (Signed)
Procedure: Verbal consent obtained.  Anesthetic allergies review. Alcohol prep. Cold spray applied to area of concern and patient anesthetized using 1 cc of Marcaine via metacarpal block technique proximal just proximal to the MCP of the fourth finger.  She tolerated to procedure without complaint or difficulty. Philis Fendt, MS, PA-C 1:40 PM, 09/04/2016

## 2016-09-04 NOTE — Progress Notes (Addendum)
Subjective:  By signing my name below, I, Moises Blood, attest that this documentation has been prepared under the direction and in the presence of Merri Ray, MD. Electronically Signed: Moises Blood, [place]. 09/04/2016 , 12:58 PM .  Patient was seen in Room 6 .   Patient ID: Ashley Villarreal, female    DOB: Nov 03, 1955, 61 y.o.   MRN: [id number] Chief Complaint  Patient presents with  . Hand Pain    caught on spindle going up stairs   HPI Ashley Villarreal is a 61 y.o. female  Patient reports she was going up her stairs and when she reached the top, her right hand and 4th finger got caught at the spindle under the handrail, occurred about 2 hours ago. She believes she may have jerked her hand and finger. She does note feeling a pop when it occurred. She did apply ice over the area. She denies breaking fingers in the past. She is right hand dominant.   Patient Active Problem List   Diagnosis Date Noted  . Asthma, severe persistent, well-controlled 06/12/2015  . LPRD (laryngopharyngeal reflux disease) 06/12/2015  . Allergic rhinoconjunctivitis 06/12/2015  . Allergy with anaphylaxis due to food 06/12/2015  . Elevated serum creatinine 05/01/2015  . Obstructive sleep apnea   . Laryngopharyngeal reflux 02/23/2015  . Hyperlipemia 08/10/2011  . Asthma with acute exacerbation 08/10/2011  . Hypertensive heart disease   . Diabetes mellitus type 2, noninsulin dependent ([place])    Past Medical History:  Diagnosis Date  . Allergy   . Asthma   . COPD (chronic obstructive pulmonary disease) ([place])   . Diabetes mellitus without complication ([place])   . Hypertension   . Sleep apnea    uses CPAP   Past Surgical History:  Procedure Laterality Date  . COLONOSCOPY    . DILATION AND CURETTAGE OF UTERUS    . NASAL SINUS SURGERY    . TONSILLECTOMY  1975   Allergies  Allergen Reactions  . Claritin [Loratadine]     dizziness  . Doxycycline Nausea Only  . Other Other (See Comments)   Magic mouthwash  Patient get blisters on her lips   Prior to Admission medications   Medication Sig Start Date End Date Taking? Authorizing Provider  beclomethasone (QVAR) 80 MCG/ACT inhaler Inhale 2 puffs into the lungs 2 (two) times daily. 07/02/15   Jiles Prows, MD  EPINEPHrine (EPIPEN 2-PAK) 0.3 mg/0.3 mL IJ SOAJ injection Inject 0.3 mLs (0.3 mg total) into the muscle once. Patient not taking: Reported on 07/30/2016 11/04/15   Gay Filler Copland, MD  fluticasone (FLONASE) 50 MCG/ACT nasal spray Place 2 sprays into both nostrils daily. Patient not taking: Reported on 07/30/2016 04/29/16   Darreld Mclean, MD  fluticasone-salmeterol (ADVAIR HFA) 230-21 MCG/ACT inhaler Inhale two puffs twice daily to prevent cough or wheeze. Rinse, gargle, and spit after use. 06/10/16   Jiles Prows, MD  Hydroquinone-Sunscreens (ALPHAQUIN HP) 4 % CREA APPLY TO DARK SPOTS DAILY. 12/05/15   Historical Provider, MD  ipratropium-albuterol (DUONEB) 0.5-2.5 (3) MG/3ML SOLN Take 3 mLs by nebulization every 4 (four) hours as needed. Patient not taking: Reported on 07/30/2016 11/24/14   Posey Boyer, MD  levalbuterol [facility] HFA) 45 MCG/ACT inhaler Inhale two puffs every four to six hours as needed for cough or wheeze. Patient not taking: Reported on 07/30/2016 06/10/16   Jiles Prows, MD  losartan (COZAAR) 100 MG tablet TAKE 1 TABLET (100 MG TOTAL) BY MOUTH DAILY. 05/06/16  Gay Filler Copland, MD  metFORMIN (GLUCOPHAGE) 500 MG tablet TAKE 1 TABLET (500 MG TOTAL) BY MOUTH 2 (TWO) TIMES DAILY WITH A MEAL. 11/04/15   Gay Filler Copland, MD  montelukast (SINGULAIR) 10 MG tablet Take one tablet once daily as directed. 06/10/16   Jiles Prows, MD  Na Sulfate-K Sulfate-Mg Sulf 17.5-3.13-1.6 GM/180ML SOLN Suprep (no substitutions)-TAKE AS DIRECTED. 07/20/16   Mauri Pole, MD  omalizumab Arvid Right) 150 MG injection Inject 150 mg into the skin every 28 (twenty-eight) days. 08/29/15   Jiles Prows, MD  omeprazole-sodium bicarbonate  (ZEGERID) 40-1100 MG capsule Take 1 capsule by mouth daily before breakfast. 06/10/16   Jiles Prows, MD  ONE TOUCH ULTRA TEST test strip PLEASE CHECK BLOOD SUGAR DAILY. DX CODE: E11.9 10/13/15   Chelle Jeffery, PA-C  simvastatin (ZOCOR) 20 MG tablet Take one daily for cholesterol 05/06/16   Darreld Mclean, MD  XOLAIR 150 MG injection INJECT 150 MG SUBCUTANEOUSLY EVERY 4 WEEKS 09/03/16   Jiles Prows, MD   Social History   Social History  . Marital status: Married    Spouse name: N/A  . Number of children: N/A  . Years of education: N/A   Occupational History  . 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 Smoking status: Never Smoker  . Smokeless tobacco: Never Used  . Alcohol use No  . Drug use: No  . Sexual activity: Yes    Partners: Male    Birth control/ protection: Condom   Other Topics Concern  . Not on file   Social History Narrative   Married. Education: college.   Review of Systems  Constitutional: Negative for chills, fatigue, fever and unexpected weight change.  Respiratory: Negative for cough.   Gastrointestinal: Negative for constipation, diarrhea, nausea and vomiting.  Musculoskeletal: Positive for arthralgias, joint swelling and myalgias.  Skin: Negative for rash and wound.  Neurological: Negative for dizziness, weakness and headaches.       Objective:   Physical Exam  Constitutional: She is oriented to person, place, and time. She appears well-developed and well-nourished. No distress.  HENT:  Head: Normocephalic and atraumatic.  Eyes: EOM are normal. Pupils are equal, round, and reactive to light.  Neck: Neck supple.  Cardiovascular: Normal rate.   Pulmonary/Chest: Effort normal. No respiratory distress.  Musculoskeletal: Normal range of motion.  Right 4th finger: soft tissue swelling at the PIP and proximal phalanx, appears to be some rotation of the 4th finger radially at the mid and distal phalanx, in perspective to the proximal phalanx;  tenderness over dorsum of the PIP, middle and distal phalanx non tender, able to flexion and extend at the DIP but guarded exam  Neurological: She is alert and oriented to person, place, and time.  Skin: Skin is warm and dry.  Psychiatric: She has a normal mood and affect. Her behavior is normal.  Nursing note and vitals reviewed.   Vitals:   09/04/16 1208  BP: (!) 158/64  Pulse: 92  Resp: 16  Temp: 98.7 F (37.1 C)  TempSrc: Oral  SpO2: 98%  Weight: 159 lb 9.6 oz (72.4 kg)  Height: 5\' 8"  (1.727 m)  1:34 PM Ring still in place on 4th finger. Significant swelling proximal phalynx to PIP joint. Neurovascular intact distally, but discussed the necessity to remove ring with increasing swelling. Attempted umbilical cord wrap technique, but unable to tolerate due to discomfort and significant swelling. Decision made to cut with ring cutter without difficulty. Experience  pain with attempts at widening reading to remove, so metacarpal block performed as in procedure note.   2:39 PM Ring removed with assistance by other medical provider, as ring was being removed, upper aspect of ring solder points opened. No complications. X-ray performed indicating comminuted fracture of the proximal phalanx, no apparent joint involvement. Hand surgeon on call contacted to determine treatment.     Assessment & Plan:    Ashley Villarreal is a 61 y.o. female Finger pain, right - Plan: DG Finger Ring Right, Brace application  Swelling of finger joint of right hand - Plan: DG Finger Ring Right, Brace application  Closed displaced fracture of proximal phalanx of right ring finger, initial encounter - Plan: Ambulatory referral to Hand Surgery, traMADol (ULTRAM) 50 MG tablet, Brace application  Proximal phalanx fracture due to injury earlier in the day. Did require significant amount of time to remove ring on affected finger as above. Over 40 minutes of face-to-face care with this visit. Discussed with hand  surgeon, recommended brace and referral to his office for evaluation, as may need surgical intervention with plate fixation.   -Symptomatic care discussed, tramadol prescription given with side effects discussed. Placed in dorsal padded splint with buddy tape the fifth finger. RTC precautions if worsening prior to seeing specialist  Meds ordered this encounter  Medications  . traMADol (ULTRAM) 50 MG tablet    Sig: Take 1 tablet (50 mg total) by mouth every 6 (six) hours as needed.    Dispense:  20 tablet    Refill:  0   Patient Instructions    I referred you to the hand surgeon, he should be receiving a call next week for that appointment. For now keep the brace on the fourth finger, buddy taped to the fifth finger, Tylenol or tramadol if needed for pain.   Finger Fracture A finger fracture is a break in any of the bones of the fingers. What are the causes? The main cause of finger fractures is traumatic injury, such as from:  An injury while playing sports.  A workplace injury.  A fall. What increases the risk? Activities that can increase your risk of finger fractures include:  Sports.  Workplace activities that involve machinery.  A condition called osteoporosis, which can make your bones less dense and cause them to fracture more easily. What are the signs or symptoms? The main symptoms of a broken finger are pain, bruising, and swelling shortly after the injury. Other symptoms include:  Bruising of your finger.  Stiffness of your finger.  Exposed bones (compound or open fracture) if the fracture is severe. How is this diagnosed? This condition is diagnosed based on a physical exam, your medical history, and your symptoms. An X-ray will also be done. How is this treated? Treatment for this condition depends on the severity of the fracture. If the bones are still in place, the finger may be splinted to keep the finger still while it heals (immobilization). If the bones  are out of place, they will need to be put back into place. This may be done without surgery, or surgery may be needed. You may also be given exercises to do to regain strength and flexibility (physical therapy). Follow these instructions at home: If you have a splint:   Wear the splint as told by your health care provider. Remove it only as told by your health care provider.  Loosen the splint if your fingers tingle, become numb, or turn cold and blue.  Keep the splint clean.  If the splint is not waterproof:  Do not let it get wet.  Cover it with a watertight covering when you take a bath or a shower. Managing pain, stiffness, and swelling   If directed, put ice on the injured area:  If you have a removable splint, remove it as told by your health care provider.  Put ice in a plastic bag.  Place a towel between your skin and the bag.  Leave the ice on for 20 minutes, 2-3 times a day.  Move your fingers often to avoid stiffness and to lessen swelling.  Raise (elevate) the injured area above the level of your heart while you are sitting or lying down. Driving   Do not drive or use heavy machinery while taking prescription pain medicine.  Ask your health care provider when it is safe to drive if you have a splint. General instructions   Do not put pressure on any part of the splint until it is fully hardened. This may take several hours.  Do not use any products that contain nicotine or tobacco, such as cigarettes and e-cigarettes. These can delay bone healing. If you need help quitting, ask your health care provider.  Take over-the-counter and prescription medicines only as told by your health care provider.  Do exercises as told by your health care provider.  Keep all follow-up visits as told by your health care provider. This is important. Contact a health care provider if:  Your pain or swelling gets worse even with treatment.  You have trouble moving your  finger. Get help right away if:  Your finger becomes numb or blue. Summary  A finger fracture is a break in any of the bones of the fingers.  Traumatic injury is the main cause of finger fractures.  Treatment for this condition depends on the severity of the fracture. This information is not intended to replace advice given to you by your health care provider. Make sure you discuss any questions you have with your health care provider. Document Released: 09/20/2000 Document Revised: 04/28/2016 Document Reviewed: 04/28/2016 Elsevier Interactive Patient Education  2017 [employer].    IF you received an x-ray today, you will receive an invoice from [facility]. Please contact [facility] at [phone] with questions or concerns regarding your invoice.   IF you received labwork today, you will receive an invoice from [facility]. Please contact [facility] at [phone] with questions or concerns regarding your invoice.   Our billing staff will not be able to assist you with questions regarding bills from these companies.  You will be contacted with the lab results as soon as they are available. The fastest way to get your results is to activate your My Chart account. Instructions are located on the last page of this paperwork. If you have not heard from [place] regarding the results in 2 weeks, please contact this office.       I personally performed the services described in this documentation, which was scribed in my presence. The recorded information has been reviewed and considered for accuracy and completeness, addended by me as needed, and agree with information above.  Signed,   Merri Ray, MD [facility] [place].  09/06/16 10:12 PM

## 2016-09-04 NOTE — Patient Instructions (Addendum)
I referred you to the hand surgeon, he should be receiving a call next week for that appointment. For now keep the brace on the fourth finger, buddy taped to the fifth finger, Tylenol or tramadol if needed for pain.   Finger Fracture A finger fracture is a break in any of the bones of the fingers. What are the causes? The main cause of finger fractures is traumatic injury, such as from:  An injury while playing sports.  A workplace injury.  A fall. What increases the risk? Activities that can increase your risk of finger fractures include:  Sports.  Workplace activities that involve machinery.  A condition called osteoporosis, which can make your bones less dense and cause them to fracture more easily. What are the signs or symptoms? The main symptoms of a broken finger are pain, bruising, and swelling shortly after the injury. Other symptoms include:  Bruising of your finger.  Stiffness of your finger.  Exposed bones (compound or open fracture) if the fracture is severe. How is this diagnosed? This condition is diagnosed based on a physical exam, your medical history, and your symptoms. An X-ray will also be done. How is this treated? Treatment for this condition depends on the severity of the fracture. If the bones are still in place, the finger may be splinted to keep the finger still while it heals (immobilization). If the bones are out of place, they will need to be put back into place. This may be done without surgery, or surgery may be needed. You may also be given exercises to do to regain strength and flexibility (physical therapy). Follow these instructions at home: If you have a splint:   Wear the splint as told by your health care provider. Remove it only as told by your health care provider.  Loosen the splint if your fingers tingle, become numb, or turn cold and blue.  Keep the splint clean.  If the splint is not waterproof:  Do not let it get  wet.  Cover it with a watertight covering when you take a bath or a shower. Managing pain, stiffness, and swelling   If directed, put ice on the injured area:  If you have a removable splint, remove it as told by your health care provider.  Put ice in a plastic bag.  Place a towel between your skin and the bag.  Leave the ice on for 20 minutes, 2-3 times a day.  Move your fingers often to avoid stiffness and to lessen swelling.  Raise (elevate) the injured area above the level of your heart while you are sitting or lying down. Driving   Do not drive or use heavy machinery while taking prescription pain medicine.  Ask your health care provider when it is safe to drive if you have a splint. General instructions   Do not put pressure on any part of the splint until it is fully hardened. This may take several hours.  Do not use any products that contain nicotine or tobacco, such as cigarettes and e-cigarettes. These can delay bone healing. If you need help quitting, ask your health care provider.  Take over-the-counter and prescription medicines only as told by your health care provider.  Do exercises as told by your health care provider.  Keep all follow-up visits as told by your health care provider. This is important. Contact a health care provider if:  Your pain or swelling gets worse even with treatment.  You have trouble moving your finger.  Get help right away if:  Your finger becomes numb or blue. Summary  A finger fracture is a break in any of the bones of the fingers.  Traumatic injury is the main cause of finger fractures.  Treatment for this condition depends on the severity of the fracture. This information is not intended to replace advice given to you by your health care provider. Make sure you discuss any questions you have with your health care provider. Document Released: 09/20/2000 Document Revised: 04/28/2016 Document Reviewed: 04/28/2016 Elsevier  Interactive Patient Education  2017 [employer].    IF you received an x-ray today, you will receive an invoice from [facility]. Please contact [facility] at [phone] with questions or concerns regarding your invoice.   IF you received labwork today, you will receive an invoice from [facility]. Please contact [facility] at [phone] with questions or concerns regarding your invoice.   Our billing staff will not be able to assist you with questions regarding bills from these companies.  You will be contacted with the lab results as soon as they are available. The fastest way to get your results is to activate your My Chart account. Instructions are located on the last page of this paperwork. If you have not heard from [place] regarding the results in 2 weeks, please contact this office.

## 2016-09-07 ENCOUNTER — Telehealth: Payer: Self-pay | Admitting: Family Medicine

## 2016-09-07 NOTE — Telephone Encounter (Signed)
Left VM with patient informing her of referral apt with Dr Lenon Curt that is scheduled for Wednesday 3/21 at [facility]. Their office is at [address], [place], [place] [address] // Phone [phone]

## 2016-09-09 DIAGNOSIS — S62614A Displaced fracture of proximal phalanx of right ring finger, initial encounter for closed fracture: Secondary | ICD-10-CM | POA: Diagnosis not present

## 2016-09-11 DIAGNOSIS — S62614A Displaced fracture of proximal phalanx of right ring finger, initial encounter for closed fracture: Secondary | ICD-10-CM | POA: Diagnosis not present

## 2016-09-11 DIAGNOSIS — S62604B Fracture of unspecified phalanx of right ring finger, initial encounter for open fracture: Secondary | ICD-10-CM | POA: Diagnosis not present

## 2016-09-15 DIAGNOSIS — L818 Other specified disorders of pigmentation: Secondary | ICD-10-CM | POA: Diagnosis not present

## 2016-09-15 DIAGNOSIS — L821 Other seborrheic keratosis: Secondary | ICD-10-CM | POA: Diagnosis not present

## 2016-09-15 DIAGNOSIS — D229 Melanocytic nevi, unspecified: Secondary | ICD-10-CM | POA: Diagnosis not present

## 2016-09-29 ENCOUNTER — Ambulatory Visit (INDEPENDENT_AMBULATORY_CARE_PROVIDER_SITE_OTHER): Payer: 59 | Admitting: *Deleted

## 2016-09-29 DIAGNOSIS — J4540 Moderate persistent asthma, uncomplicated: Secondary | ICD-10-CM | POA: Diagnosis not present

## 2016-10-06 DIAGNOSIS — S62614S Displaced fracture of proximal phalanx of right ring finger, sequela: Secondary | ICD-10-CM | POA: Diagnosis not present

## 2016-10-06 DIAGNOSIS — M6281 Muscle weakness (generalized): Secondary | ICD-10-CM | POA: Diagnosis not present

## 2016-10-06 DIAGNOSIS — M25541 Pain in joints of right hand: Secondary | ICD-10-CM | POA: Diagnosis not present

## 2016-10-13 DIAGNOSIS — S62614S Displaced fracture of proximal phalanx of right ring finger, sequela: Secondary | ICD-10-CM | POA: Diagnosis not present

## 2016-10-13 DIAGNOSIS — M25541 Pain in joints of right hand: Secondary | ICD-10-CM | POA: Diagnosis not present

## 2016-10-13 DIAGNOSIS — M6281 Muscle weakness (generalized): Secondary | ICD-10-CM | POA: Diagnosis not present

## 2016-10-15 DIAGNOSIS — M6281 Muscle weakness (generalized): Secondary | ICD-10-CM | POA: Diagnosis not present

## 2016-10-15 DIAGNOSIS — S62614S Displaced fracture of proximal phalanx of right ring finger, sequela: Secondary | ICD-10-CM | POA: Diagnosis not present

## 2016-10-15 DIAGNOSIS — M25541 Pain in joints of right hand: Secondary | ICD-10-CM | POA: Diagnosis not present

## 2016-10-20 DIAGNOSIS — S62614S Displaced fracture of proximal phalanx of right ring finger, sequela: Secondary | ICD-10-CM | POA: Diagnosis not present

## 2016-10-20 DIAGNOSIS — M25541 Pain in joints of right hand: Secondary | ICD-10-CM | POA: Diagnosis not present

## 2016-10-20 DIAGNOSIS — M6281 Muscle weakness (generalized): Secondary | ICD-10-CM | POA: Diagnosis not present

## 2016-10-21 DIAGNOSIS — G4733 Obstructive sleep apnea (adult) (pediatric): Secondary | ICD-10-CM | POA: Diagnosis not present

## 2016-10-22 DIAGNOSIS — S62614S Displaced fracture of proximal phalanx of right ring finger, sequela: Secondary | ICD-10-CM | POA: Diagnosis not present

## 2016-10-22 DIAGNOSIS — M6281 Muscle weakness (generalized): Secondary | ICD-10-CM | POA: Diagnosis not present

## 2016-10-22 DIAGNOSIS — M25541 Pain in joints of right hand: Secondary | ICD-10-CM | POA: Diagnosis not present

## 2016-10-26 DIAGNOSIS — M25541 Pain in joints of right hand: Secondary | ICD-10-CM | POA: Diagnosis not present

## 2016-10-26 DIAGNOSIS — M6281 Muscle weakness (generalized): Secondary | ICD-10-CM | POA: Diagnosis not present

## 2016-10-26 DIAGNOSIS — S62614S Displaced fracture of proximal phalanx of right ring finger, sequela: Secondary | ICD-10-CM | POA: Diagnosis not present

## 2016-10-27 ENCOUNTER — Other Ambulatory Visit: Payer: Self-pay | Admitting: Family Medicine

## 2016-10-27 ENCOUNTER — Ambulatory Visit (INDEPENDENT_AMBULATORY_CARE_PROVIDER_SITE_OTHER): Payer: 59 | Admitting: *Deleted

## 2016-10-27 DIAGNOSIS — L500 Allergic urticaria: Secondary | ICD-10-CM

## 2016-10-27 DIAGNOSIS — E785 Hyperlipidemia, unspecified: Secondary | ICD-10-CM

## 2016-10-27 DIAGNOSIS — J4540 Moderate persistent asthma, uncomplicated: Secondary | ICD-10-CM

## 2016-10-28 DIAGNOSIS — S62614D Displaced fracture of proximal phalanx of right ring finger, subsequent encounter for fracture with routine healing: Secondary | ICD-10-CM | POA: Diagnosis not present

## 2016-10-29 DIAGNOSIS — S62614S Displaced fracture of proximal phalanx of right ring finger, sequela: Secondary | ICD-10-CM | POA: Diagnosis not present

## 2016-10-29 DIAGNOSIS — M25541 Pain in joints of right hand: Secondary | ICD-10-CM | POA: Diagnosis not present

## 2016-10-29 DIAGNOSIS — M6281 Muscle weakness (generalized): Secondary | ICD-10-CM | POA: Diagnosis not present

## 2016-11-02 DIAGNOSIS — M25541 Pain in joints of right hand: Secondary | ICD-10-CM | POA: Diagnosis not present

## 2016-11-02 DIAGNOSIS — S62614S Displaced fracture of proximal phalanx of right ring finger, sequela: Secondary | ICD-10-CM | POA: Diagnosis not present

## 2016-11-02 DIAGNOSIS — M6281 Muscle weakness (generalized): Secondary | ICD-10-CM | POA: Diagnosis not present

## 2016-11-05 ENCOUNTER — Encounter: Payer: Self-pay | Admitting: Family Medicine

## 2016-11-05 ENCOUNTER — Ambulatory Visit (INDEPENDENT_AMBULATORY_CARE_PROVIDER_SITE_OTHER): Payer: 59 | Admitting: Family Medicine

## 2016-11-05 VITALS — BP 132/72 | HR 79 | Temp 98.6°F | Ht 68.0 in | Wt 152.0 lb

## 2016-11-05 DIAGNOSIS — Z0000 Encounter for general adult medical examination without abnormal findings: Secondary | ICD-10-CM

## 2016-11-05 DIAGNOSIS — E785 Hyperlipidemia, unspecified: Secondary | ICD-10-CM | POA: Diagnosis not present

## 2016-11-05 DIAGNOSIS — Z1321 Encounter for screening for nutritional disorder: Secondary | ICD-10-CM

## 2016-11-05 DIAGNOSIS — Z130 Encounter for screening for diseases of the blood and blood-forming organs and certain disorders involving the immune mechanism: Secondary | ICD-10-CM

## 2016-11-05 DIAGNOSIS — E1121 Type 2 diabetes mellitus with diabetic nephropathy: Secondary | ICD-10-CM

## 2016-11-05 DIAGNOSIS — Z1329 Encounter for screening for other suspected endocrine disorder: Secondary | ICD-10-CM | POA: Diagnosis not present

## 2016-11-05 LAB — COMPREHENSIVE METABOLIC PANEL
ALT: 19 U/L (ref 0–35)
AST: 18 U/L (ref 0–37)
Albumin: 4.4 g/dL (ref 3.5–5.2)
Alkaline Phosphatase: 69 U/L (ref 39–117)
BUN: 25 mg/dL — ABNORMAL HIGH (ref 6–23)
CALCIUM: 10 mg/dL (ref 8.4–10.5)
CHLORIDE: 104 meq/L (ref 96–112)
CO2: 32 meq/L (ref 19–32)
Creatinine, Ser: 1.2 mg/dL (ref 0.40–1.20)
GFR: 59.02 mL/min — AB (ref >60.00)
Glucose, Bld: 87 mg/dL (ref 70–99)
Potassium: 4 mEq/L (ref 3.5–5.1)
Sodium: 140 mEq/L (ref 135–145)
Total Bilirubin: 0.4 mg/dL (ref 0.2–1.2)
Total Protein: 7.6 g/dL (ref 6.0–8.3)

## 2016-11-05 LAB — LIPID PANEL
CHOLESTEROL: 182 mg/dL (ref 0–200)
HDL: 53.4 mg/dL (ref >39.00)
LDL CALC: 115 mg/dL — AB (ref 0–99)
NonHDL: 128.58
TRIGLYCERIDES: 68 mg/dL (ref 0.0–149.0)
Total CHOL/HDL Ratio: 3
VLDL: 13.6 mg/dL (ref 0.0–40.0)

## 2016-11-05 LAB — CBC
HCT: 36.3 % (ref 36.0–46.0)
HEMOGLOBIN: 11.7 g/dL — AB (ref 12.0–15.0)
MCHC: 32.3 g/dL (ref 30.0–36.0)
MCV: 84.1 fl (ref 78.0–100.0)
PLATELETS: 273 10*3/uL (ref 150.0–400.0)
RBC: 4.31 Mil/uL (ref 3.87–5.11)
RDW: 14.4 % (ref 11.5–15.5)
WBC: 7.4 10*3/uL (ref 4.0–10.5)

## 2016-11-05 LAB — VITAMIN D 25 HYDROXY (VIT D DEFICIENCY, FRACTURES): VITD: 27.21 ng/mL — ABNORMAL LOW (ref 30.00–100.00)

## 2016-11-05 LAB — TSH: TSH: 0.77 u[IU]/mL (ref 0.35–4.50)

## 2016-11-05 LAB — HEMOGLOBIN A1C: Hgb A1c MFr Bld: 6.6 % — ABNORMAL HIGH (ref 4.6–6.5)

## 2016-11-05 MED ORDER — METFORMIN HCL 500 MG PO TABS: ORAL_TABLET | ORAL | 3 refills | Status: DC

## 2016-11-05 NOTE — Progress Notes (Addendum)
Ashley Villarreal at [facility] [address] [place], [place] [address] [phone]  Date:  11/05/2016   Name:  Ashley Villarreal   DOB:  Feb 15, 1956   MRN:  [id number]  PCP:  Darreld Mclean, MD    Chief Complaint: Annual Exam (Last PAP: June 2017 per pt, with GYN)   History of Present Illness:  Ashley Villarreal is a 61 y.o. very pleasant female patient who presents with the following:  Here today for a CPE History of asthma/ allergies on immunotherapy, well controlled DM and HTN She feels like the allergy shots are working well- her asthma is doing better!  Due for foot exam ?update pap today if she would like; she sees GYN, Dr. Rhodia Albright.  Her last pap was in June of 2017 Due complete labs today- she is fasting today  She has lost a few pounds- she is walking/ running 6 miles a day.   She is enjoying doing this  Colonoscopy done in February- 5 year recall   She does not have any concerns today She is feeling very well and hopes that her labs will look as good as she feels   Wt Readings from Last 3 Encounters:  11/05/16 152 lb (68.9 kg)  09/04/16 159 lb 9.6 oz (72.4 kg)  07/30/16 157 lb (71.2 kg)     Lab Results  Component Value Date   HGBA1C 6.0 05/06/2016     Patient Active Problem List   Diagnosis Date Noted  . Asthma, severe persistent, well-controlled 06/12/2015  . Allergic rhinoconjunctivitis 06/12/2015  . Allergy with anaphylaxis due to food 06/12/2015  . Elevated serum creatinine 05/01/2015  . Obstructive sleep apnea   . Laryngopharyngeal reflux 02/23/2015  . Hyperlipemia 08/10/2011  . Hypertensive heart disease   . Diabetes mellitus type 2, noninsulin dependent ([place])     Past Medical History:  Diagnosis Date  . Allergy   . Asthma   . COPD (chronic obstructive pulmonary disease) ([place])   . Diabetes mellitus without complication ([place])   . Hypertension   . Sleep apnea    uses CPAP    Past Surgical  History:  Procedure Laterality Date  . COLONOSCOPY    . DILATION AND CURETTAGE OF UTERUS    . NASAL SINUS SURGERY    . TONSILLECTOMY  1975    Social History  Substance Use Topics  . Smoking status: Never Smoker  . Smokeless tobacco: Never Used  . Alcohol use No    Family History  Problem Relation Age of Onset  . Stroke Father   . Heart disease Father   . Diabetes Mother   . Heart disease Mother   . Stroke Mother   . Hyperlipidemia Sister   . Hyperlipidemia Brother   . Diabetes Paternal Grandmother   . Hypertension Brother   . Colon cancer Neg Hx     Allergies  Allergen Reactions  . Claritin [Loratadine]     dizziness  . Doxycycline Nausea Only  . Other Other (See Comments)    Magic mouthwash  Patient get blisters on her lips    Medication list has been reviewed and updated.  Current Outpatient Prescriptions on File Prior to Visit  Medication Sig Dispense Refill  . beclomethasone (QVAR) 80 MCG/ACT inhaler Inhale 2 puffs into the lungs 2 (two) times daily. 1 Inhaler 5  . EPINEPHrine (EPIPEN 2-PAK) 0.3 mg/0.3 mL IJ SOAJ injection Inject 0.3 mLs (0.3 mg total) into  the muscle once. 2 Device 1  . fluticasone-salmeterol (ADVAIR HFA) 230-21 MCG/ACT inhaler Inhale two puffs twice daily to prevent cough or wheeze. Rinse, gargle, and spit after use. 3 Inhaler 1  . Hydroquinone-Sunscreens (ALPHAQUIN HP) 4 % CREA APPLY TO DARK SPOTS DAILY.  3  . losartan (COZAAR) 100 MG tablet TAKE 1 TABLET (100 MG TOTAL) BY MOUTH DAILY. 90 tablet 3  . montelukast (SINGULAIR) 10 MG tablet Take one tablet once daily as directed. 30 tablet 5  . Na Sulfate-K Sulfate-Mg Sulf 17.5-3.13-1.6 GM/180ML SOLN Suprep (no substitutions)-TAKE AS DIRECTED. 354 mL 0  . omalizumab (XOLAIR) 150 MG injection Inject 150 mg into the skin every 28 (twenty-eight) days. 1 each 11  . omeprazole-sodium bicarbonate (ZEGERID) 40-1100 MG capsule Take 1 capsule by mouth daily before breakfast. 90 capsule 1  . ONE TOUCH  ULTRA TEST test strip PLEASE CHECK BLOOD SUGAR DAILY. DX CODE: E11.9 100 each 3  . simvastatin (ZOCOR) 20 MG tablet TAKE ONE DAILY FOR CHOLESTEROL 90 tablet 1  . traMADol (ULTRAM) 50 MG tablet Take 1 tablet (50 mg total) by mouth every 6 (six) hours as needed. 20 tablet 0  . XOLAIR 150 MG injection INJECT 150 MG SUBCUTANEOUSLY EVERY 4 WEEKS 1 each 11   Current Facility-Administered Medications on File Prior to Visit  Medication Dose Route Frequency Provider Last Rate Last Dose  . 0.9 %  sodium chloride infusion  500 mL Intravenous Continuous Nandigam, Kavitha V, MD      . omalizumab Arvid Right) injection 150 mg  150 mg Subcutaneous Q28 days Kozlow, Donnamarie Poag, MD   150 mg at 10/27/16 1101    Review of Systems:  As per HPI- otherwise negative.   Physical Examination: Vitals:   11/05/16 0940  BP: 132/72  Pulse: 79  Temp: 98.6 F (37 C)   Vitals:   11/05/16 0940  Weight: 152 lb (68.9 kg)  Height: 5\' 8"  (1.727 m)   Body mass index is 23.11 kg/m. Ideal Body Weight: Weight in (lb) to have BMI = 25: 164.1  GEN: WDWN, NAD, Non-toxic, A & O x 3, normal weight, looks well HEENT: Atraumatic, Normocephalic. Neck supple. No masses, No LAD.  Bilateral TM wnl, oropharynx normal.  PEERL,EOMI.   Ears and Nose: No external deformity. CV: RRR, No M/G/R. No JVD. No thrill. No extra heart sounds. PULM: CTA B, no wheezes, crackles, rhonchi. No retractions. No resp. distress. No accessory muscle use. ABD: S, NT, ND, +BS. No rebound. No HSM. EXTR: No c/c/e NEURO Normal gait.  PSYCH: Normally interactive. Conversant. Not depressed or anxious appearing.  Calm demeanor.  Normal foot exam today   Assessment and Plan: Physical exam  Controlled type 2 diabetes mellitus with diabetic nephropathy, without long-term current use of insulin ([place]) - Plan: Comprehensive metabolic panel, Hemoglobin A1c, metFORMIN (GLUCOPHAGE) 500 MG tablet  Dyslipidemia - Plan: Lipid panel  Screening for thyroid disorder -  Plan: TSH  Screening for deficiency anemia - Plan: CBC  Encounter for vitamin deficiency screening - Plan: Vitamin D (25 hydroxy)  Here today for a CPE She is doing well, feeling well, weight looks great and she is exercising daily Will be in touch with her pending labs Praised her fitness efforts Assuming labs are ok we can visit in 6 months   Signed Lamar Blinks, MD  Received her labs - message to pt on mychart   Results for orders placed or performed in visit on 11/05/16  CBC  Result Value Ref Range  WBC 7.4 4.0 - 10.5 K/uL   RBC 4.31 3.87 - 5.11 Mil/uL   Platelets 273.0 150.0 - 400.0 K/uL   Hemoglobin 11.7 (L) 12.0 - 15.0 g/dL   HCT 36.3 36.0 - 46.0 %   MCV 84.1 78.0 - 100.0 fl   MCHC 32.3 30.0 - 36.0 g/dL   RDW 14.4 11.5 - 15.5 %  Comprehensive metabolic panel  Result Value Ref Range   Sodium 140 135 - 145 mEq/L   Potassium 4.0 3.5 - 5.1 mEq/L   Chloride 104 96 - 112 mEq/L   CO2 32 19 - 32 mEq/L   Glucose, Bld 87 70 - 99 mg/dL   BUN 25 (H) 6 - 23 mg/dL   Creatinine, Ser 1.20 0.40 - 1.20 mg/dL   Total Bilirubin 0.4 0.2 - 1.2 mg/dL   Alkaline Phosphatase 69 39 - 117 U/L   AST 18 0 - 37 U/L   ALT 19 0 - 35 U/L   Total Protein 7.6 6.0 - 8.3 g/dL   Albumin 4.4 3.5 - 5.2 g/dL   Calcium 10.0 8.4 - 10.5 mg/dL   GFR 59.02 (L) >60.00 mL/min  Hemoglobin A1c  Result Value Ref Range   Hgb A1c MFr Bld 6.6 (H) 4.6 - 6.5 %  Lipid panel  Result Value Ref Range   Cholesterol 182 0 - 200 mg/dL   Triglycerides 68.0 0.0 - 149.0 mg/dL   HDL 53.40 >39.00 mg/dL   VLDL 13.6 0.0 - 40.0 mg/dL   LDL Cholesterol 115 (H) 0 - 99 mg/dL   Total CHOL/HDL Ratio 3    NonHDL 128.58   TSH  Result Value Ref Range   TSH 0.77 0.35 - 4.50 uIU/mL  Vitamin D (25 hydroxy)  Result Value Ref Range   VITD 27.21 (L) 30.00 - 100.00 ng/mL

## 2016-11-05 NOTE — Patient Instructions (Signed)
It was very nice to see you today- take care and I will be in touch with your labs asap.

## 2016-11-06 DIAGNOSIS — M25541 Pain in joints of right hand: Secondary | ICD-10-CM | POA: Diagnosis not present

## 2016-11-06 DIAGNOSIS — S62614S Displaced fracture of proximal phalanx of right ring finger, sequela: Secondary | ICD-10-CM | POA: Diagnosis not present

## 2016-11-06 DIAGNOSIS — M6281 Muscle weakness (generalized): Secondary | ICD-10-CM | POA: Diagnosis not present

## 2016-11-09 DIAGNOSIS — S62614S Displaced fracture of proximal phalanx of right ring finger, sequela: Secondary | ICD-10-CM | POA: Diagnosis not present

## 2016-11-09 DIAGNOSIS — M6281 Muscle weakness (generalized): Secondary | ICD-10-CM | POA: Diagnosis not present

## 2016-11-09 DIAGNOSIS — M25541 Pain in joints of right hand: Secondary | ICD-10-CM | POA: Diagnosis not present

## 2016-11-10 ENCOUNTER — Telehealth: Payer: Self-pay | Admitting: Family Medicine

## 2016-11-10 NOTE — Telephone Encounter (Signed)
Pt brought [facility] lab results form for Dr Lorelei Pont to fill out and fax to [phone]. Pt req confirmation ph call to let her know when done [phone]. Placed form in front office tray.

## 2016-11-10 NOTE — Telephone Encounter (Signed)
Form received. Filled in as much as possible and forwarded to PCP for review and completion.

## 2016-11-17 DIAGNOSIS — M25541 Pain in joints of right hand: Secondary | ICD-10-CM | POA: Diagnosis not present

## 2016-11-17 DIAGNOSIS — M6281 Muscle weakness (generalized): Secondary | ICD-10-CM | POA: Diagnosis not present

## 2016-11-17 DIAGNOSIS — S62614S Displaced fracture of proximal phalanx of right ring finger, sequela: Secondary | ICD-10-CM | POA: Diagnosis not present

## 2016-11-19 ENCOUNTER — Other Ambulatory Visit: Payer: Self-pay | Admitting: Allergy and Immunology

## 2016-11-19 DIAGNOSIS — M25541 Pain in joints of right hand: Secondary | ICD-10-CM | POA: Diagnosis not present

## 2016-11-19 DIAGNOSIS — M6281 Muscle weakness (generalized): Secondary | ICD-10-CM | POA: Diagnosis not present

## 2016-11-19 DIAGNOSIS — S62614S Displaced fracture of proximal phalanx of right ring finger, sequela: Secondary | ICD-10-CM | POA: Diagnosis not present

## 2016-11-23 DIAGNOSIS — M6281 Muscle weakness (generalized): Secondary | ICD-10-CM | POA: Diagnosis not present

## 2016-11-23 DIAGNOSIS — M25541 Pain in joints of right hand: Secondary | ICD-10-CM | POA: Diagnosis not present

## 2016-11-23 DIAGNOSIS — S62614S Displaced fracture of proximal phalanx of right ring finger, sequela: Secondary | ICD-10-CM | POA: Diagnosis not present

## 2016-11-26 DIAGNOSIS — M25541 Pain in joints of right hand: Secondary | ICD-10-CM | POA: Diagnosis not present

## 2016-11-26 DIAGNOSIS — M6281 Muscle weakness (generalized): Secondary | ICD-10-CM | POA: Diagnosis not present

## 2016-11-26 DIAGNOSIS — S62614S Displaced fracture of proximal phalanx of right ring finger, sequela: Secondary | ICD-10-CM | POA: Diagnosis not present

## 2016-11-27 ENCOUNTER — Other Ambulatory Visit: Payer: Self-pay | Admitting: Family Medicine

## 2016-11-27 DIAGNOSIS — I10 Essential (primary) hypertension: Secondary | ICD-10-CM

## 2016-11-30 ENCOUNTER — Other Ambulatory Visit: Payer: Self-pay | Admitting: Allergy and Immunology

## 2016-11-30 DIAGNOSIS — K219 Gastro-esophageal reflux disease without esophagitis: Secondary | ICD-10-CM

## 2016-12-01 ENCOUNTER — Ambulatory Visit (INDEPENDENT_AMBULATORY_CARE_PROVIDER_SITE_OTHER): Payer: 59 | Admitting: *Deleted

## 2016-12-01 DIAGNOSIS — J4540 Moderate persistent asthma, uncomplicated: Secondary | ICD-10-CM

## 2016-12-06 ENCOUNTER — Encounter: Payer: Self-pay | Admitting: Internal Medicine

## 2016-12-07 ENCOUNTER — Ambulatory Visit (INDEPENDENT_AMBULATORY_CARE_PROVIDER_SITE_OTHER): Payer: 59 | Admitting: Internal Medicine

## 2016-12-07 ENCOUNTER — Encounter: Payer: Self-pay | Admitting: Internal Medicine

## 2016-12-07 VITALS — BP 130/74 | HR 86 | Ht 68.0 in | Wt 158.8 lb

## 2016-12-07 DIAGNOSIS — J309 Allergic rhinitis, unspecified: Secondary | ICD-10-CM | POA: Diagnosis not present

## 2016-12-07 DIAGNOSIS — J4550 Severe persistent asthma, uncomplicated: Secondary | ICD-10-CM

## 2016-12-07 DIAGNOSIS — H1010 Acute atopic conjunctivitis, unspecified eye: Secondary | ICD-10-CM | POA: Diagnosis not present

## 2016-12-07 DIAGNOSIS — G4733 Obstructive sleep apnea (adult) (pediatric): Secondary | ICD-10-CM

## 2016-12-07 NOTE — Progress Notes (Signed)
HPI female never smoker followed for OSA, complicated by HBP, asthma/Xolair (Dr. Neldon Mc) NPSG 02/11/15- AHI 12.5 per hour, CPAP to 9   --------------------------------------------------------------------------------  11/27/2015-61 year old female never smoker followed for OSA, complicated by HBP, asthma/Xolair (Dr. Neldon Mc) CPAP 9/[facility] FOLLOWS FOR: DME [facility]; Pt wears CPAP every night for about 7 hours; no supplies needed at this time. Pressure works well for pt. Very good download report with excellent compliance and control on CPAP 9. She has lost 20 pounds. We described effects of weight loss. We discussed oral appliances as an alternative to CPAP.  12/07/16-61 year old female never smoker followed for OSA, complicated by HBP, asthma/Xolair (Dr. Neldon Mc) CPAP 9/[facility]> 8 today Asthma: Pt states her breathing is doing well Denies coughing, She states she is using her cpap machine every night, she feels like she is doing well on it DME:[facility] Download 93% compliance averaging 6-7 hours per night, AHI 2.7/hour. Machine is about 60 years old. She is very pleased with CPAP, definitely benefiting. Complains of persistent nasal congestion "sinus". Dr. Neldon Mc gave Singulair which helped for a while. Continues Flonase.  ROS-see HPI   Negative unless "+" Constitutional:    weight loss, night sweats, fevers, chills, fatigue, lassitude. HEENT:    headaches, difficulty swallowing, tooth/dental problems, sore throat,       sneezing, itching, ear ache, + nasal congestion, post nasal drip, snoring CV:    chest pain, orthopnea, PND, swelling in lower extremities, anasarca,                                                          dizziness, palpitations Resp:   shortness of breath with exertion or at rest.                productive cough,   non-productive cough, coughing up of blood.              change in color of mucus.  wheezing.   Skin:    rash or lesions. GI:  No-   heartburn, indigestion, abdominal  pain, nausea, vomiting,  GU: . MS:   joint pain, stiffness, decreased range of motion, back pain. Neuro-     nothing unusual Psych:  change in mood or affect.  depression or anxiety.   memory loss.  OBJ- Physical Exam General- Alert, Oriented, Affect-appropriate, Distress- none acute, normal weight Skin- rash-none, lesions- none, excoriation- none Lymphadenopathy- none Head- atraumatic            Eyes- Gross vision intact, PERRLA, conjunctivae and secretions clear            Ears- Hearing, canals-normal            Nose- + turbinate edema, no-Septal dev, mucus, polyps, erosion, perforation             Throat- Mallampati III-IV , mucosa clear , drainage- none, tonsils- atrophic Neck- flexible , trachea midline, no stridor , thyroid nl, carotid no bruit Chest - symmetrical excursion , unlabored           Heart/CV- RRR , no murmur , no gallop  , no rub, nl s1 s2                           - JVD- none , edema- none, stasis changes- none,  varices- none           Lung- clear to P&A, wheeze- none, cough- none , dullness-none, rub- none           Chest wall-  Abd-  Br/ Gen/ Rectal- Not done, not indicated Extrem- cyanosis- none, clubbing, none, atrophy- none, strength- nl Neuro- grossly intact to observation

## 2016-12-07 NOTE — Patient Instructions (Addendum)
Order- DME [facility]- please reduce CPAP pressure to 8 cwp, continue mask of choice humidifier, supplies, AirView    Dx OSA   Please call if we can help

## 2016-12-11 NOTE — Assessment & Plan Note (Signed)
Good compliance and control confirmed by download. She is pleased with CPAP and convinced that it helps her. We are going to try reducing the pressure from 9-8, hoping that this would help any vasomotor rhinitis caused by air jets from her nasal pillows mask.

## 2016-12-11 NOTE — Assessment & Plan Note (Signed)
There is a possibility that airflow jets through her nasal pillows CPAP mask or causing a vasomotor rhinitis component. We are reducing her pressure flow a little to see if it helps.

## 2016-12-11 NOTE — Assessment & Plan Note (Signed)
She is working with her allergist. Feels well controlled at this time.

## 2016-12-15 ENCOUNTER — Ambulatory Visit (INDEPENDENT_AMBULATORY_CARE_PROVIDER_SITE_OTHER): Payer: 59 | Admitting: Allergy and Immunology

## 2016-12-15 ENCOUNTER — Encounter: Payer: Self-pay | Admitting: Allergy and Immunology

## 2016-12-15 VITALS — BP 132/70 | HR 88 | Resp 18

## 2016-12-15 DIAGNOSIS — J4550 Severe persistent asthma, uncomplicated: Secondary | ICD-10-CM

## 2016-12-15 DIAGNOSIS — L500 Allergic urticaria: Secondary | ICD-10-CM

## 2016-12-15 DIAGNOSIS — G4733 Obstructive sleep apnea (adult) (pediatric): Secondary | ICD-10-CM | POA: Diagnosis not present

## 2016-12-15 DIAGNOSIS — J3089 Other allergic rhinitis: Secondary | ICD-10-CM | POA: Diagnosis not present

## 2016-12-15 DIAGNOSIS — K219 Gastro-esophageal reflux disease without esophagitis: Secondary | ICD-10-CM | POA: Diagnosis not present

## 2016-12-15 MED ORDER — MONTELUKAST SODIUM 10 MG PO TABS: ORAL_TABLET | ORAL | 1 refills | Status: DC

## 2016-12-15 NOTE — Patient Instructions (Addendum)
  1. Continue Advair 230 two inhalations two times per day  2. Add Qvar 80 two inhalations twice a day during increased asthma activity.  3. Continue Zegerid 40 mg daily  4. Consistently use Flonase one spray each nostril once a day  5. Continue montelukast 10 mg tablet 1 time per day  6.  Continue CPAP  7. Continue Xolair and EpiPen  8. Return to clinic in 6 months or earlier if problem  9 Obtain fall flu vaccine

## 2016-12-15 NOTE — Progress Notes (Signed)
Follow-up Note  Referring Provider: Darreld Mclean, MD Primary Provider: Darreld Mclean, MD Date of Office Visit: 12/15/2016  Subjective:   Ashley Villarreal (DOB: 07-28-1955) is a 61 y.o. female who returns to the [place] on 12/15/2016 in re-evaluation of the following:  HPI: Doneisha returns to this clinic in reevaluation of her asthma and allergic rhinoconjunctivitis and sleep apnea. I last saw her in this clinic December 2017 at which point in time we asked her to consistently use Flonase and start montelukast and buffer her CPAP solution to treat what appeared to be some significant upper airway congestive issues that may have been precipitated by her CPAP machine.  She is doing very good at this point in time regarding her asthma. She has not required a systemic steroid to treat an exacerbation. She does not use a short acting bronchodilator. She continues on Xolair and Advair.  She believes that her upper airways are doing better while using Singulair. She can now smell somewhat and most importantly she can now hear without much problem. She also thinks that buffering her CPAP solution has helped with the congestive issue that she develops while using this device. She did visit with Dr. Annamaria Boots recently who has decreased the pressure of her CPAP machine to treat some form of vasomotor rhinitis from high pressure exposure.  She has no problems with reflux.  Allergies as of 12/15/2016      Reactions   Claritin [loratadine]    dizziness   Doxycycline Nausea Only   Other Other (See Comments)   Magic mouthwash  Patient get blisters on her lips      Medication List      adapalene 0.1 % gel Commonly known as:  DIFFERIN   ALPHAQUIN HP 4 % Crea APPLY TO DARK SPOTS DAILY.   beclomethasone 80 MCG/ACT inhaler Commonly known as:  QVAR Inhale 2 puffs into the lungs 2 (two) times daily.   EPINEPHrine 0.3 mg/0.3 mL Soaj injection Commonly known as:  EPIPEN  2-PAK Inject 0.3 mLs (0.3 mg total) into the muscle once.   fluticasone 50 MCG/ACT nasal spray Commonly known as:  FLONASE PLACE 2 SPRAYS INTO BOTH NOSTRILS DAILY.   fluticasone-salmeterol 230-21 MCG/ACT inhaler Commonly known as:  ADVAIR HFA Inhale two puffs twice daily to prevent cough or wheeze. Rinse, gargle, and spit after use.   HYDROCORTISONE IN ABSORBASE 1 % ointment Generic drug:  hydrocortisone   losartan 100 MG tablet Commonly known as:  COZAAR TAKE 1 TABLET (100 MG TOTAL) BY MOUTH DAILY.   metFORMIN 500 MG tablet Commonly known as:  GLUCOPHAGE TAKE 1 TABLET (500 MG TOTAL) BY MOUTH 2 (TWO) TIMES DAILY WITH A MEAL.   montelukast 10 MG tablet Commonly known as:  SINGULAIR TAKE ONE TABLET ONCE DAILY AS DIRECTED.   omalizumab 150 MG injection Commonly known as:  XOLAIR Inject 150 mg into the skin every 28 (twenty-eight) days.   XOLAIR 150 MG injection Generic drug:  omalizumab INJECT 150 MG SUBCUTANEOUSLY EVERY 4 WEEKS   omeprazole-sodium bicarbonate 40-1100 MG capsule Commonly known as:  ZEGERID TAKE 1 CAPSULE BY MOUTH DAILY BEFORE BREAKFAST.   ONE TOUCH ULTRA TEST test strip Generic drug:  glucose blood PLEASE CHECK BLOOD SUGAR DAILY. DX CODE: E11.9   simvastatin 20 MG tablet Commonly known as:  ZOCOR TAKE ONE DAILY FOR CHOLESTEROL   traMADol 50 MG tablet Commonly known as:  ULTRAM Take 1 tablet (50 mg total) by mouth every  6 (six) hours as needed.       Past Medical History:  Diagnosis Date  . Allergy   . Asthma   . COPD (chronic obstructive pulmonary disease) ([place])   . Diabetes mellitus without complication ([place])   . Hypertension   . Sleep apnea    uses CPAP    Past Surgical History:  Procedure Laterality Date  . COLONOSCOPY    . DILATION AND CURETTAGE OF UTERUS    . NASAL SINUS SURGERY    . TONSILLECTOMY  1975    Review of systems negative except as noted in HPI / PMHx or noted below:  Review of Systems  Constitutional: Negative.    HENT: Negative.   Eyes: Negative.   Respiratory: Negative.   Cardiovascular: Negative.   Gastrointestinal: Negative.   Genitourinary: Negative.   Musculoskeletal: Negative.   Skin: Negative.   Neurological: Negative.   Endo/Heme/Allergies: Negative.   Psychiatric/Behavioral: Negative.      Objective:   Vitals:   12/15/16 1039  BP: 132/70  Pulse: 88  Resp: 18          Physical Exam  Constitutional: She is well-developed, well-nourished, and in no distress.  HENT:  Head: Normocephalic.  Right Ear: External ear and ear canal normal. A middle ear effusion (dull light reflex) is present.  Left Ear: Tympanic membrane, external ear and ear canal normal.  Nose: Nose normal. No mucosal edema or rhinorrhea.  Mouth/Throat: Uvula is midline, oropharynx is clear and moist and mucous membranes are normal. No oropharyngeal exudate.  Eyes: Conjunctivae are normal.  Neck: Trachea normal. No tracheal tenderness present. No tracheal deviation present. No thyromegaly present.  Cardiovascular: Normal rate, regular rhythm, S1 normal, S2 normal and normal heart sounds.   No murmur heard. Pulmonary/Chest: Breath sounds normal. No stridor. No respiratory distress. She has no wheezes. She has no rales.  Musculoskeletal: She exhibits no edema.  Lymphadenopathy:       Head (right side): No tonsillar adenopathy present.       Head (left side): No tonsillar adenopathy present.    She has no cervical adenopathy.  Neurological: She is alert. Gait normal.  Skin: No rash noted. She is not diaphoretic. No erythema. Nails show no clubbing.  Psychiatric: Mood and affect normal.    Diagnostics:    Spirometry was performed and demonstrated an FEV1 of 1.65 at 71 % of predicted.  The patient had an Asthma Control Test with the following results: ACT Total Score: 25.    Assessment and Plan:   1. Asthma, severe persistent, well-controlled   2. Other allergic rhinitis   3. Allergic urticaria   4.  Sleep apnea, obstructive   5. LPRD (laryngopharyngeal reflux disease)     1. Continue Advair 230 two inhalations two times per day  2. Add Qvar 80 two inhalations twice a day during increased asthma activity.  3. Continue Zegerid 40 mg daily  4. Consistently use Flonase one spray each nostril once a day  5. Continue montelukast 10 mg tablet 1 time per day  6. Continue CPAP  7. Continue Xolair and EpiPen  8. Return to clinic in 6 months or earlier if problem  9 Obtain fall flu vaccine  Overall Michiko appears to be doing relatively well regarding her respiratory tract disease and reflux and she will continue on the collection of medical therapy noted above which includes anti-inflammatory agents for her respiratory tract, Xolair, and use of her proton pump inhibitor. I will see her back  in this clinic in approximately 6 months or earlier if there is a problem.  Allena Katz, MD Allergy / Immunology [place]

## 2016-12-27 ENCOUNTER — Other Ambulatory Visit: Payer: Self-pay | Admitting: Physician Assistant

## 2016-12-29 ENCOUNTER — Ambulatory Visit: Payer: Self-pay

## 2016-12-29 DIAGNOSIS — Z87448 Personal history of other diseases of urinary system: Secondary | ICD-10-CM | POA: Diagnosis not present

## 2016-12-29 DIAGNOSIS — J45909 Unspecified asthma, uncomplicated: Secondary | ICD-10-CM | POA: Diagnosis not present

## 2016-12-29 DIAGNOSIS — Z124 Encounter for screening for malignant neoplasm of cervix: Secondary | ICD-10-CM | POA: Diagnosis not present

## 2016-12-29 DIAGNOSIS — Z09 Encounter for follow-up examination after completed treatment for conditions other than malignant neoplasm: Secondary | ICD-10-CM | POA: Diagnosis not present

## 2016-12-30 ENCOUNTER — Ambulatory Visit (INDEPENDENT_AMBULATORY_CARE_PROVIDER_SITE_OTHER): Payer: 59 | Admitting: *Deleted

## 2016-12-30 DIAGNOSIS — S62614D Displaced fracture of proximal phalanx of right ring finger, subsequent encounter for fracture with routine healing: Secondary | ICD-10-CM | POA: Diagnosis not present

## 2016-12-30 DIAGNOSIS — J4540 Moderate persistent asthma, uncomplicated: Secondary | ICD-10-CM | POA: Diagnosis not present

## 2017-01-07 ENCOUNTER — Other Ambulatory Visit: Payer: Self-pay | Admitting: Allergy and Immunology

## 2017-01-07 DIAGNOSIS — K219 Gastro-esophageal reflux disease without esophagitis: Secondary | ICD-10-CM

## 2017-01-08 DIAGNOSIS — G4733 Obstructive sleep apnea (adult) (pediatric): Secondary | ICD-10-CM | POA: Diagnosis not present

## 2017-01-10 ENCOUNTER — Other Ambulatory Visit: Payer: Self-pay | Admitting: Allergy and Immunology

## 2017-01-15 ENCOUNTER — Other Ambulatory Visit: Payer: Self-pay | Admitting: *Deleted

## 2017-01-15 MED ORDER — STERILE WATER FOR INJECTION IJ SOLN: 10.0000 mL | INTRAMUSCULAR | 11 refills | Status: DC

## 2017-01-19 DIAGNOSIS — T8489XA Other specified complication of internal orthopedic prosthetic devices, implants and grafts, initial encounter: Secondary | ICD-10-CM | POA: Diagnosis not present

## 2017-01-19 DIAGNOSIS — M25641 Stiffness of right hand, not elsewhere classified: Secondary | ICD-10-CM | POA: Diagnosis not present

## 2017-01-19 DIAGNOSIS — Z472 Encounter for removal of internal fixation device: Secondary | ICD-10-CM | POA: Diagnosis not present

## 2017-01-19 DIAGNOSIS — M20091 Other deformity of right finger(s): Secondary | ICD-10-CM | POA: Diagnosis not present

## 2017-01-19 DIAGNOSIS — S62614S Displaced fracture of proximal phalanx of right ring finger, sequela: Secondary | ICD-10-CM | POA: Diagnosis not present

## 2017-01-22 DIAGNOSIS — G4733 Obstructive sleep apnea (adult) (pediatric): Secondary | ICD-10-CM | POA: Diagnosis not present

## 2017-01-27 ENCOUNTER — Ambulatory Visit: Payer: Self-pay

## 2017-01-27 DIAGNOSIS — M25641 Stiffness of right hand, not elsewhere classified: Secondary | ICD-10-CM | POA: Diagnosis not present

## 2017-01-27 DIAGNOSIS — S62614S Displaced fracture of proximal phalanx of right ring finger, sequela: Secondary | ICD-10-CM | POA: Diagnosis not present

## 2017-01-27 DIAGNOSIS — M25541 Pain in joints of right hand: Secondary | ICD-10-CM | POA: Diagnosis not present

## 2017-01-28 DIAGNOSIS — M25541 Pain in joints of right hand: Secondary | ICD-10-CM | POA: Diagnosis not present

## 2017-01-28 DIAGNOSIS — S62614S Displaced fracture of proximal phalanx of right ring finger, sequela: Secondary | ICD-10-CM | POA: Diagnosis not present

## 2017-01-28 DIAGNOSIS — M25641 Stiffness of right hand, not elsewhere classified: Secondary | ICD-10-CM | POA: Diagnosis not present

## 2017-01-29 ENCOUNTER — Ambulatory Visit (INDEPENDENT_AMBULATORY_CARE_PROVIDER_SITE_OTHER): Payer: 59

## 2017-01-29 DIAGNOSIS — J4540 Moderate persistent asthma, uncomplicated: Secondary | ICD-10-CM

## 2017-02-01 DIAGNOSIS — M25641 Stiffness of right hand, not elsewhere classified: Secondary | ICD-10-CM | POA: Diagnosis not present

## 2017-02-01 DIAGNOSIS — M25541 Pain in joints of right hand: Secondary | ICD-10-CM | POA: Diagnosis not present

## 2017-02-01 DIAGNOSIS — S62614S Displaced fracture of proximal phalanx of right ring finger, sequela: Secondary | ICD-10-CM | POA: Diagnosis not present

## 2017-02-03 DIAGNOSIS — M25641 Stiffness of right hand, not elsewhere classified: Secondary | ICD-10-CM | POA: Diagnosis not present

## 2017-02-03 DIAGNOSIS — M25541 Pain in joints of right hand: Secondary | ICD-10-CM | POA: Diagnosis not present

## 2017-02-03 DIAGNOSIS — S62614S Displaced fracture of proximal phalanx of right ring finger, sequela: Secondary | ICD-10-CM | POA: Diagnosis not present

## 2017-02-05 DIAGNOSIS — M25641 Stiffness of right hand, not elsewhere classified: Secondary | ICD-10-CM | POA: Diagnosis not present

## 2017-02-05 DIAGNOSIS — S62614S Displaced fracture of proximal phalanx of right ring finger, sequela: Secondary | ICD-10-CM | POA: Diagnosis not present

## 2017-02-05 DIAGNOSIS — M25541 Pain in joints of right hand: Secondary | ICD-10-CM | POA: Diagnosis not present

## 2017-02-08 DIAGNOSIS — S62614S Displaced fracture of proximal phalanx of right ring finger, sequela: Secondary | ICD-10-CM | POA: Diagnosis not present

## 2017-02-08 DIAGNOSIS — M25541 Pain in joints of right hand: Secondary | ICD-10-CM | POA: Diagnosis not present

## 2017-02-08 DIAGNOSIS — M25641 Stiffness of right hand, not elsewhere classified: Secondary | ICD-10-CM | POA: Diagnosis not present

## 2017-02-10 DIAGNOSIS — M25541 Pain in joints of right hand: Secondary | ICD-10-CM | POA: Diagnosis not present

## 2017-02-10 DIAGNOSIS — S62614S Displaced fracture of proximal phalanx of right ring finger, sequela: Secondary | ICD-10-CM | POA: Diagnosis not present

## 2017-02-10 DIAGNOSIS — M25641 Stiffness of right hand, not elsewhere classified: Secondary | ICD-10-CM | POA: Diagnosis not present

## 2017-02-12 DIAGNOSIS — M25641 Stiffness of right hand, not elsewhere classified: Secondary | ICD-10-CM | POA: Diagnosis not present

## 2017-02-12 DIAGNOSIS — M25541 Pain in joints of right hand: Secondary | ICD-10-CM | POA: Diagnosis not present

## 2017-02-12 DIAGNOSIS — S62614S Displaced fracture of proximal phalanx of right ring finger, sequela: Secondary | ICD-10-CM | POA: Diagnosis not present

## 2017-02-15 DIAGNOSIS — M25641 Stiffness of right hand, not elsewhere classified: Secondary | ICD-10-CM | POA: Diagnosis not present

## 2017-02-15 DIAGNOSIS — S62614S Displaced fracture of proximal phalanx of right ring finger, sequela: Secondary | ICD-10-CM | POA: Diagnosis not present

## 2017-02-15 DIAGNOSIS — M25541 Pain in joints of right hand: Secondary | ICD-10-CM | POA: Diagnosis not present

## 2017-02-17 DIAGNOSIS — M25541 Pain in joints of right hand: Secondary | ICD-10-CM | POA: Diagnosis not present

## 2017-02-17 DIAGNOSIS — S62614S Displaced fracture of proximal phalanx of right ring finger, sequela: Secondary | ICD-10-CM | POA: Diagnosis not present

## 2017-02-17 DIAGNOSIS — M25641 Stiffness of right hand, not elsewhere classified: Secondary | ICD-10-CM | POA: Diagnosis not present

## 2017-02-18 DIAGNOSIS — S62614S Displaced fracture of proximal phalanx of right ring finger, sequela: Secondary | ICD-10-CM | POA: Diagnosis not present

## 2017-02-18 DIAGNOSIS — M25541 Pain in joints of right hand: Secondary | ICD-10-CM | POA: Diagnosis not present

## 2017-02-18 DIAGNOSIS — M25641 Stiffness of right hand, not elsewhere classified: Secondary | ICD-10-CM | POA: Diagnosis not present

## 2017-02-24 DIAGNOSIS — M25641 Stiffness of right hand, not elsewhere classified: Secondary | ICD-10-CM | POA: Diagnosis not present

## 2017-02-24 DIAGNOSIS — M25541 Pain in joints of right hand: Secondary | ICD-10-CM | POA: Diagnosis not present

## 2017-02-24 DIAGNOSIS — S62614S Displaced fracture of proximal phalanx of right ring finger, sequela: Secondary | ICD-10-CM | POA: Diagnosis not present

## 2017-02-25 DIAGNOSIS — M25641 Stiffness of right hand, not elsewhere classified: Secondary | ICD-10-CM | POA: Diagnosis not present

## 2017-02-25 DIAGNOSIS — S62614S Displaced fracture of proximal phalanx of right ring finger, sequela: Secondary | ICD-10-CM | POA: Diagnosis not present

## 2017-02-25 DIAGNOSIS — M25541 Pain in joints of right hand: Secondary | ICD-10-CM | POA: Diagnosis not present

## 2017-02-26 ENCOUNTER — Ambulatory Visit (INDEPENDENT_AMBULATORY_CARE_PROVIDER_SITE_OTHER): Payer: 59

## 2017-02-26 DIAGNOSIS — J4540 Moderate persistent asthma, uncomplicated: Secondary | ICD-10-CM

## 2017-02-26 DIAGNOSIS — M25541 Pain in joints of right hand: Secondary | ICD-10-CM | POA: Diagnosis not present

## 2017-02-26 DIAGNOSIS — M25641 Stiffness of right hand, not elsewhere classified: Secondary | ICD-10-CM | POA: Diagnosis not present

## 2017-02-26 DIAGNOSIS — S62614S Displaced fracture of proximal phalanx of right ring finger, sequela: Secondary | ICD-10-CM | POA: Diagnosis not present

## 2017-03-01 DIAGNOSIS — S62614S Displaced fracture of proximal phalanx of right ring finger, sequela: Secondary | ICD-10-CM | POA: Diagnosis not present

## 2017-03-01 DIAGNOSIS — M25541 Pain in joints of right hand: Secondary | ICD-10-CM | POA: Diagnosis not present

## 2017-03-01 DIAGNOSIS — M25641 Stiffness of right hand, not elsewhere classified: Secondary | ICD-10-CM | POA: Diagnosis not present

## 2017-03-03 DIAGNOSIS — M25541 Pain in joints of right hand: Secondary | ICD-10-CM | POA: Diagnosis not present

## 2017-03-03 DIAGNOSIS — M25641 Stiffness of right hand, not elsewhere classified: Secondary | ICD-10-CM | POA: Diagnosis not present

## 2017-03-03 DIAGNOSIS — S62614S Displaced fracture of proximal phalanx of right ring finger, sequela: Secondary | ICD-10-CM | POA: Diagnosis not present

## 2017-03-04 DIAGNOSIS — M25641 Stiffness of right hand, not elsewhere classified: Secondary | ICD-10-CM | POA: Diagnosis not present

## 2017-03-04 DIAGNOSIS — S62614S Displaced fracture of proximal phalanx of right ring finger, sequela: Secondary | ICD-10-CM | POA: Diagnosis not present

## 2017-03-04 DIAGNOSIS — M25541 Pain in joints of right hand: Secondary | ICD-10-CM | POA: Diagnosis not present

## 2017-03-08 DIAGNOSIS — S62614S Displaced fracture of proximal phalanx of right ring finger, sequela: Secondary | ICD-10-CM | POA: Diagnosis not present

## 2017-03-08 DIAGNOSIS — M25641 Stiffness of right hand, not elsewhere classified: Secondary | ICD-10-CM | POA: Diagnosis not present

## 2017-03-08 DIAGNOSIS — M25541 Pain in joints of right hand: Secondary | ICD-10-CM | POA: Diagnosis not present

## 2017-03-09 DIAGNOSIS — S62614S Displaced fracture of proximal phalanx of right ring finger, sequela: Secondary | ICD-10-CM | POA: Diagnosis not present

## 2017-03-09 DIAGNOSIS — M25641 Stiffness of right hand, not elsewhere classified: Secondary | ICD-10-CM | POA: Diagnosis not present

## 2017-03-09 DIAGNOSIS — M25541 Pain in joints of right hand: Secondary | ICD-10-CM | POA: Diagnosis not present

## 2017-03-10 DIAGNOSIS — M25541 Pain in joints of right hand: Secondary | ICD-10-CM | POA: Diagnosis not present

## 2017-03-10 DIAGNOSIS — M25641 Stiffness of right hand, not elsewhere classified: Secondary | ICD-10-CM | POA: Diagnosis not present

## 2017-03-10 DIAGNOSIS — S62614S Displaced fracture of proximal phalanx of right ring finger, sequela: Secondary | ICD-10-CM | POA: Diagnosis not present

## 2017-03-15 DIAGNOSIS — M25541 Pain in joints of right hand: Secondary | ICD-10-CM | POA: Diagnosis not present

## 2017-03-15 DIAGNOSIS — S62614S Displaced fracture of proximal phalanx of right ring finger, sequela: Secondary | ICD-10-CM | POA: Diagnosis not present

## 2017-03-15 DIAGNOSIS — M25641 Stiffness of right hand, not elsewhere classified: Secondary | ICD-10-CM | POA: Diagnosis not present

## 2017-03-22 DIAGNOSIS — M25641 Stiffness of right hand, not elsewhere classified: Secondary | ICD-10-CM | POA: Diagnosis not present

## 2017-03-22 DIAGNOSIS — M6281 Muscle weakness (generalized): Secondary | ICD-10-CM | POA: Diagnosis not present

## 2017-03-22 DIAGNOSIS — M25541 Pain in joints of right hand: Secondary | ICD-10-CM | POA: Diagnosis not present

## 2017-03-24 DIAGNOSIS — M25541 Pain in joints of right hand: Secondary | ICD-10-CM | POA: Diagnosis not present

## 2017-03-24 DIAGNOSIS — M6281 Muscle weakness (generalized): Secondary | ICD-10-CM | POA: Diagnosis not present

## 2017-03-24 DIAGNOSIS — M25641 Stiffness of right hand, not elsewhere classified: Secondary | ICD-10-CM | POA: Diagnosis not present

## 2017-03-26 ENCOUNTER — Ambulatory Visit (INDEPENDENT_AMBULATORY_CARE_PROVIDER_SITE_OTHER): Payer: 59

## 2017-03-26 DIAGNOSIS — J4540 Moderate persistent asthma, uncomplicated: Secondary | ICD-10-CM

## 2017-03-26 DIAGNOSIS — M25641 Stiffness of right hand, not elsewhere classified: Secondary | ICD-10-CM | POA: Diagnosis not present

## 2017-03-26 DIAGNOSIS — M6281 Muscle weakness (generalized): Secondary | ICD-10-CM | POA: Diagnosis not present

## 2017-03-26 DIAGNOSIS — M25541 Pain in joints of right hand: Secondary | ICD-10-CM | POA: Diagnosis not present

## 2017-03-29 DIAGNOSIS — R801 Persistent proteinuria, unspecified: Secondary | ICD-10-CM | POA: Diagnosis not present

## 2017-03-29 DIAGNOSIS — I129 Hypertensive chronic kidney disease with stage 1 through stage 4 chronic kidney disease, or unspecified chronic kidney disease: Secondary | ICD-10-CM | POA: Diagnosis not present

## 2017-03-29 DIAGNOSIS — N2581 Secondary hyperparathyroidism of renal origin: Secondary | ICD-10-CM | POA: Diagnosis not present

## 2017-03-29 DIAGNOSIS — N183 Chronic kidney disease, stage 3 (moderate): Secondary | ICD-10-CM | POA: Diagnosis not present

## 2017-03-29 DIAGNOSIS — M6281 Muscle weakness (generalized): Secondary | ICD-10-CM | POA: Diagnosis not present

## 2017-03-29 DIAGNOSIS — N189 Chronic kidney disease, unspecified: Secondary | ICD-10-CM | POA: Diagnosis not present

## 2017-03-29 DIAGNOSIS — M25541 Pain in joints of right hand: Secondary | ICD-10-CM | POA: Diagnosis not present

## 2017-03-29 DIAGNOSIS — D631 Anemia in chronic kidney disease: Secondary | ICD-10-CM | POA: Diagnosis not present

## 2017-03-29 DIAGNOSIS — M25641 Stiffness of right hand, not elsewhere classified: Secondary | ICD-10-CM | POA: Diagnosis not present

## 2017-03-31 DIAGNOSIS — M25541 Pain in joints of right hand: Secondary | ICD-10-CM | POA: Diagnosis not present

## 2017-03-31 DIAGNOSIS — M25641 Stiffness of right hand, not elsewhere classified: Secondary | ICD-10-CM | POA: Diagnosis not present

## 2017-03-31 DIAGNOSIS — M6281 Muscle weakness (generalized): Secondary | ICD-10-CM | POA: Diagnosis not present

## 2017-04-01 DIAGNOSIS — M6281 Muscle weakness (generalized): Secondary | ICD-10-CM | POA: Diagnosis not present

## 2017-04-01 DIAGNOSIS — M25541 Pain in joints of right hand: Secondary | ICD-10-CM | POA: Diagnosis not present

## 2017-04-01 DIAGNOSIS — M25641 Stiffness of right hand, not elsewhere classified: Secondary | ICD-10-CM | POA: Diagnosis not present

## 2017-04-05 DIAGNOSIS — M25641 Stiffness of right hand, not elsewhere classified: Secondary | ICD-10-CM | POA: Diagnosis not present

## 2017-04-05 DIAGNOSIS — M6281 Muscle weakness (generalized): Secondary | ICD-10-CM | POA: Diagnosis not present

## 2017-04-05 DIAGNOSIS — M25541 Pain in joints of right hand: Secondary | ICD-10-CM | POA: Diagnosis not present

## 2017-04-06 ENCOUNTER — Other Ambulatory Visit: Payer: Self-pay | Admitting: Obstetrics & Gynecology

## 2017-04-06 DIAGNOSIS — Z1231 Encounter for screening mammogram for malignant neoplasm of breast: Secondary | ICD-10-CM

## 2017-04-07 DIAGNOSIS — M25541 Pain in joints of right hand: Secondary | ICD-10-CM | POA: Diagnosis not present

## 2017-04-07 DIAGNOSIS — M6281 Muscle weakness (generalized): Secondary | ICD-10-CM | POA: Diagnosis not present

## 2017-04-07 DIAGNOSIS — M25641 Stiffness of right hand, not elsewhere classified: Secondary | ICD-10-CM | POA: Diagnosis not present

## 2017-04-09 DIAGNOSIS — M6281 Muscle weakness (generalized): Secondary | ICD-10-CM | POA: Diagnosis not present

## 2017-04-09 DIAGNOSIS — M25541 Pain in joints of right hand: Secondary | ICD-10-CM | POA: Diagnosis not present

## 2017-04-09 DIAGNOSIS — M25641 Stiffness of right hand, not elsewhere classified: Secondary | ICD-10-CM | POA: Diagnosis not present

## 2017-04-12 DIAGNOSIS — M25541 Pain in joints of right hand: Secondary | ICD-10-CM | POA: Diagnosis not present

## 2017-04-12 DIAGNOSIS — M25641 Stiffness of right hand, not elsewhere classified: Secondary | ICD-10-CM | POA: Diagnosis not present

## 2017-04-12 DIAGNOSIS — M6281 Muscle weakness (generalized): Secondary | ICD-10-CM | POA: Diagnosis not present

## 2017-04-14 DIAGNOSIS — H2513 Age-related nuclear cataract, bilateral: Secondary | ICD-10-CM | POA: Diagnosis not present

## 2017-04-14 DIAGNOSIS — M25641 Stiffness of right hand, not elsewhere classified: Secondary | ICD-10-CM | POA: Diagnosis not present

## 2017-04-14 DIAGNOSIS — M25541 Pain in joints of right hand: Secondary | ICD-10-CM | POA: Diagnosis not present

## 2017-04-14 DIAGNOSIS — M6281 Muscle weakness (generalized): Secondary | ICD-10-CM | POA: Diagnosis not present

## 2017-04-14 DIAGNOSIS — E119 Type 2 diabetes mellitus without complications: Secondary | ICD-10-CM | POA: Diagnosis not present

## 2017-04-14 LAB — HM DIABETES EYE EXAM

## 2017-04-15 DIAGNOSIS — M6281 Muscle weakness (generalized): Secondary | ICD-10-CM | POA: Diagnosis not present

## 2017-04-15 DIAGNOSIS — M25641 Stiffness of right hand, not elsewhere classified: Secondary | ICD-10-CM | POA: Diagnosis not present

## 2017-04-15 DIAGNOSIS — M25541 Pain in joints of right hand: Secondary | ICD-10-CM | POA: Diagnosis not present

## 2017-04-19 DIAGNOSIS — M25641 Stiffness of right hand, not elsewhere classified: Secondary | ICD-10-CM | POA: Diagnosis not present

## 2017-04-19 DIAGNOSIS — M6281 Muscle weakness (generalized): Secondary | ICD-10-CM | POA: Diagnosis not present

## 2017-04-19 DIAGNOSIS — M25541 Pain in joints of right hand: Secondary | ICD-10-CM | POA: Diagnosis not present

## 2017-04-23 ENCOUNTER — Ambulatory Visit (INDEPENDENT_AMBULATORY_CARE_PROVIDER_SITE_OTHER): Payer: 59

## 2017-04-23 DIAGNOSIS — J4540 Moderate persistent asthma, uncomplicated: Secondary | ICD-10-CM | POA: Diagnosis not present

## 2017-04-27 DIAGNOSIS — G4733 Obstructive sleep apnea (adult) (pediatric): Secondary | ICD-10-CM | POA: Diagnosis not present

## 2017-05-15 NOTE — Progress Notes (Addendum)
[place] at [facility] [address] [place], [place] [address] [phone]  Date:  05/17/2017   Name:  Ashley Villarreal   DOB:  10/30/1955   MRN:  [id number]  PCP:  Darreld Mclean, MD    Chief Complaint: Follow-up (Pt here for f/u visit. Flu vaccine given today. )   History of Present Illness:  Ashley Villarreal is a 61 y.o. very pleasant female patient who presents with the following:  Follow-up visit today Last seen by myself in May for a CPE:  History of asthma/ allergies on immunotherapy, well controlled DM and HTN She feels like the allergy shots are working well- her asthma is doing better!  Due for foot exam ?update pap today if she would like; she sees GYN, Dr. Rhodia Albright.  Her last pap was in June of 2017 Due complete labs today- she is fasting today She has lost a few pounds- she is walking/ running 6 miles a day.   She is enjoying doing this  Colonoscopy done in February- 5 year recall   Lab message to pt: Your labs overall look great. Your blood count is fine- you are minimally anemic but this is baseline for you. Metabolic profile looks fine- your kidney function is stable  A1c is fine- you can continue your current dose of metformin  Cholesterol looks very good!   Your vitamin D is slightly low. If you are not already, start on a vitamin D supplement (OTC) of 1,000 IU daily.   Foot exam: today Eye exam: in october Flu: today  She is still exercising a lot.  She continues to walk/ run about 6 miles a day 7 days of the week Her joints are doing fine even with all her running and walking She notes that her mood is good  She uses CPAP for her OSA-  She has noted a poor sense of smell since she started on her CPAP 3 years ago. othewise she has no concerns today She does see pulmonology for management of her OSA   BP Readings from Last 3 Encounters:  05/17/17 (!) 144/86  12/15/16 132/70  12/07/16 130/74    Wt Readings from Last 3 Encounters:  05/17/17 159 lb 12.8 oz (72.5 kg)  12/07/16 158 lb 12.8 oz (72 kg)  11/05/16 152 lb (68.9 kg)    Lab Results  Component Value Date   HGBA1C 6.6 (H) 11/05/2016    Patient Active Problem List   Diagnosis Date Noted  . Asthma, severe persistent, well-controlled 06/12/2015  . Allergic rhinoconjunctivitis 06/12/2015  . Allergy with anaphylaxis due to food 06/12/2015  . Elevated serum creatinine 05/01/2015  . Obstructive sleep apnea   . Laryngopharyngeal reflux 02/23/2015  . Hyperlipemia 08/10/2011  . Hypertensive heart disease   . Diabetes mellitus type 2, noninsulin dependent ([place])     Past Medical History:  Diagnosis Date  . Allergy   . Asthma   . COPD (chronic obstructive pulmonary disease) ([place])   . Diabetes mellitus without complication ([place])   . Hypertension   . Sleep apnea    uses CPAP    Past Surgical History:  Procedure Laterality Date  . COLONOSCOPY    . DILATION AND CURETTAGE OF UTERUS    . NASAL SINUS SURGERY    . TONSILLECTOMY  1975    Social History   Tobacco Use  . Smoking status: Never Smoker  . Smokeless tobacco: Never Used  Substance  Use Topics  . Alcohol use: No    Alcohol/week: 0.0 oz  . Drug use: No    Family History  Problem Relation Age of Onset  . Stroke Father   . Heart disease Father   . Diabetes Mother   . Heart disease Mother   . Stroke Mother   . Hyperlipidemia Sister   . Hyperlipidemia Brother   . Diabetes Paternal Grandmother   . Hypertension Brother   . Colon cancer Neg Hx     Allergies  Allergen Reactions  . Claritin [Loratadine]     dizziness  . Doxycycline Nausea Only  . Other Other (See Comments)    Magic mouthwash  Patient get blisters on her lips    Medication list has been reviewed and updated.  Current Outpatient Medications on File Prior to Visit  Medication Sig Dispense Refill  . adapalene (DIFFERIN) 0.1 % gel     . [place] 481-85 MCG/ACT inhaler INHALE  TWO PUFFS TWICE DAILY TO PREVENT COUGH OR WHEEZE. RINSE, GARGLE, AND SPIT AFTER USE. 36 Inhaler 1  . beclomethasone (QVAR) 80 MCG/ACT inhaler Inhale 2 puffs into the lungs 2 (two) times daily. 1 Inhaler 5  . EPINEPHrine (EPIPEN 2-PAK) 0.3 mg/0.3 mL IJ SOAJ injection Inject 0.3 mLs (0.3 mg total) into the muscle once. 2 Device 1  . fluticasone (FLONASE) 50 MCG/ACT nasal spray PLACE 2 SPRAYS INTO BOTH NOSTRILS DAILY.  6  . HYDROCORTISONE IN ABSORBASE 1 % ointment     . Hydroquinone-Sunscreens (ALPHAQUIN HP) 4 % CREA APPLY TO DARK SPOTS DAILY.  3  . losartan (COZAAR) 100 MG tablet TAKE 1 TABLET (100 MG TOTAL) BY MOUTH DAILY. 90 tablet 3  . metFORMIN (GLUCOPHAGE) 500 MG tablet TAKE 1 TABLET (500 MG TOTAL) BY MOUTH 2 (TWO) TIMES DAILY WITH A MEAL. 180 tablet 3  . montelukast (SINGULAIR) 10 MG tablet TAKE ONE TABLET ONCE DAILY AS DIRECTED. 90 tablet 1  . omalizumab (XOLAIR) 150 MG injection Inject 150 mg into the skin every 28 (twenty-eight) days. 1 each 11  . omeprazole-sodium bicarbonate (ZEGERID) 40-1100 MG capsule TAKE 1 CAPSULE BY MOUTH DAILY BEFORE BREAKFAST. 90 capsule 1  . ONE TOUCH ULTRA TEST test strip PLEASE CHECK BLOOD SUGAR DAILY. DX CODE: E11.9 100 each 6  . simvastatin (ZOCOR) 20 MG tablet TAKE ONE DAILY FOR CHOLESTEROL 90 tablet 1  . traMADol (ULTRAM) 50 MG tablet Take 1 tablet (50 mg total) by mouth every 6 (six) hours as needed. 20 tablet 0  . Water For Injection Sterile (STERILE WATER, PRESERVATIVE FREE,) injection Inject 10 mLs as directed as directed. 10 mL 11  . XOLAIR 150 MG injection INJECT 150 MG SUBCUTANEOUSLY EVERY 4 WEEKS 1 each 11   Current Facility-Administered Medications on File Prior to Visit  Medication Dose Route Frequency Provider Last Rate Last Dose  . 0.9 %  sodium chloride infusion  500 mL Intravenous Continuous Nandigam, Kavitha V, MD      . omalizumab Arvid Right) injection 150 mg  150 mg Subcutaneous Q28 days Kozlow, Donnamarie Poag, MD   150 mg at 04/23/17 1042     Review of Systems:  As per HPI- otherwise negative. No fever or chills No CP or SOB  She did not take her BP meds yet today as she fasted for labs   Physical Examination: Vitals:   05/17/17 0954  BP: (!) 144/86  Pulse: 73  Temp: 98 F (36.7 C)  SpO2: 96%   Vitals:   05/17/17 0954  Weight: 159 lb 12.8 oz (72.5 kg)  Height: 5\' 8"  (1.727 m)   Body mass index is 24.3 kg/m. Ideal Body Weight: Weight in (lb) to have BMI = 25: 164.1  GEN: WDWN, NAD, Non-toxic, A & O x 3, normal weight, looks well HEENT: Atraumatic, Normocephalic. Neck supple. No masses, No LAD. Ears and Nose: No external deformity. CV: RRR, No M/G/R. No JVD. No thrill. No extra heart sounds. PULM: CTA B, no wheezes, crackles, rhonchi. No retractions. No resp. distress. No accessory muscle use. ABD: S, NT, ND, +BS. No rebound. No HSM. EXTR: No c/c/e NEURO Normal gait.  PSYCH: Normally interactive. Conversant. Not depressed or anxious appearing.  Calm demeanor.    Assessment and Plan: Controlled type 2 diabetes mellitus with diabetic nephropathy, without long-term current use of insulin ([place]) - Plan: Basic Metabolic Panel (BMET), HgB A1c, metFORMIN (GLUCOPHAGE) 500 MG tablet  Essential hypertension - Plan: losartan (COZAAR) 100 MG tablet  Immunization due - Plan: Flu Vaccine QUAD 36+ mos IM (Fluarix & Fluzone Quad PF  Hyperlipidemia, unspecified hyperlipidemia type - Plan: simvastatin (ZOCOR) 20 MG tablet  Follow-up visit today Doing well Flu shot given Will plan further follow- up pending labs.   Signed Lamar Blinks, MD  Received her labs 11/27  GFR is ok to continue metformin She is already on losartan- positive microalbumin urine in 2016   Your A1c is ok- continue current metformin dose.  Let's plan to repeat your A1c in 4-6 months.  Your kidney function (BUN and creatinine) are stable since 2015 You do have mild chronic renal insufficiency likely due to your diabetes.   At this time I  do not think that you need any further management of your kidneys, and we hope that they will never get worse.  However, we might consider having you establish with a nephrologist at this point to follow along with your care.   Would you like me to make this referral?  If not we can continue to monitor every 6 months or so and watch for any changes  Results for orders placed or performed in visit on 73/41/93  Basic Metabolic Panel (BMET)  Result Value Ref Range   Sodium 141 135 - 145 mEq/L   Potassium 3.9 3.5 - 5.1 mEq/L   Chloride 102 96 - 112 mEq/L   CO2 29 19 - 32 mEq/L   Glucose, Bld 93 70 - 99 mg/dL   BUN 25 (H) 6 - 23 mg/dL   Creatinine, Ser 1.25 (H) 0.40 - 1.20 mg/dL   Calcium 10.0 8.4 - 10.5 mg/dL   GFR 56.20 (L) >60.00 mL/min  HgB A1c  Result Value Ref Range   Hgb A1c MFr Bld 6.7 (H) 4.6 - 6.5 %

## 2017-05-17 ENCOUNTER — Ambulatory Visit (INDEPENDENT_AMBULATORY_CARE_PROVIDER_SITE_OTHER): Payer: 59 | Admitting: Family Medicine

## 2017-05-17 ENCOUNTER — Encounter: Payer: Self-pay | Admitting: Family Medicine

## 2017-05-17 VITALS — BP 144/86 | HR 73 | Temp 98.0°F | Ht 68.0 in | Wt 159.8 lb

## 2017-05-17 DIAGNOSIS — E785 Hyperlipidemia, unspecified: Secondary | ICD-10-CM

## 2017-05-17 DIAGNOSIS — Z23 Encounter for immunization: Secondary | ICD-10-CM | POA: Diagnosis not present

## 2017-05-17 DIAGNOSIS — I10 Essential (primary) hypertension: Secondary | ICD-10-CM

## 2017-05-17 DIAGNOSIS — E1121 Type 2 diabetes mellitus with diabetic nephropathy: Secondary | ICD-10-CM | POA: Diagnosis not present

## 2017-05-17 LAB — BASIC METABOLIC PANEL
BUN: 25 mg/dL — ABNORMAL HIGH (ref 6–23)
CALCIUM: 10 mg/dL (ref 8.4–10.5)
CO2: 29 mEq/L (ref 19–32)
Chloride: 102 mEq/L (ref 96–112)
Creatinine, Ser: 1.25 mg/dL — ABNORMAL HIGH (ref 0.40–1.20)
GFR: 56.2 mL/min — AB (ref >60.00)
Glucose, Bld: 93 mg/dL (ref 70–99)
Potassium: 3.9 mEq/L (ref 3.5–5.1)
SODIUM: 141 meq/L (ref 135–145)

## 2017-05-17 LAB — HEMOGLOBIN A1C: Hgb A1c MFr Bld: 6.7 % — ABNORMAL HIGH (ref 4.6–6.5)

## 2017-05-17 MED ORDER — LOSARTAN POTASSIUM 100 MG PO TABS: ORAL_TABLET | ORAL | 3 refills | Status: DC

## 2017-05-17 MED ORDER — METFORMIN HCL 500 MG PO TABS: ORAL_TABLET | ORAL | 3 refills | Status: DC

## 2017-05-17 MED ORDER — SIMVASTATIN 20 MG PO TABS: ORAL_TABLET | ORAL | 3 refills | Status: DC

## 2017-05-17 NOTE — Patient Instructions (Signed)
It was nice to see you today!  Take care and I will be in touch with your labs Assuming all is well we can plan to recheck in 6 months.

## 2017-05-18 ENCOUNTER — Encounter: Payer: Self-pay | Admitting: Family Medicine

## 2017-05-19 ENCOUNTER — Other Ambulatory Visit: Payer: Self-pay | Admitting: Allergy and Immunology

## 2017-05-19 DIAGNOSIS — H9190 Unspecified hearing loss, unspecified ear: Secondary | ICD-10-CM

## 2017-05-21 ENCOUNTER — Ambulatory Visit
Admission: RE | Admit: 2017-05-21 | Discharge: 2017-05-21 | Disposition: A | Discharge status: Home / Self Care | Payer: 59 | Source: Ambulatory Visit | Attending: Obstetrics & Gynecology | Admitting: Obstetrics & Gynecology

## 2017-05-21 DIAGNOSIS — Z1231 Encounter for screening mammogram for malignant neoplasm of breast: Secondary | ICD-10-CM | POA: Diagnosis not present

## 2017-05-28 ENCOUNTER — Ambulatory Visit (INDEPENDENT_AMBULATORY_CARE_PROVIDER_SITE_OTHER): Payer: 59

## 2017-05-28 DIAGNOSIS — J4540 Moderate persistent asthma, uncomplicated: Secondary | ICD-10-CM

## 2017-06-08 ENCOUNTER — Ambulatory Visit: Payer: 59 | Admitting: Allergy and Immunology

## 2017-06-09 ENCOUNTER — Ambulatory Visit (INDEPENDENT_AMBULATORY_CARE_PROVIDER_SITE_OTHER): Payer: 59 | Admitting: Family Medicine

## 2017-06-09 ENCOUNTER — Encounter: Payer: Self-pay | Admitting: Family Medicine

## 2017-06-09 VITALS — BP 144/86 | HR 87 | Ht 68.0 in | Wt 161.2 lb

## 2017-06-09 DIAGNOSIS — J3089 Other allergic rhinitis: Secondary | ICD-10-CM

## 2017-06-09 DIAGNOSIS — J4550 Severe persistent asthma, uncomplicated: Secondary | ICD-10-CM | POA: Diagnosis not present

## 2017-06-09 DIAGNOSIS — R430 Anosmia: Secondary | ICD-10-CM | POA: Diagnosis not present

## 2017-06-09 DIAGNOSIS — G4733 Obstructive sleep apnea (adult) (pediatric): Secondary | ICD-10-CM | POA: Diagnosis not present

## 2017-06-09 DIAGNOSIS — K219 Gastro-esophageal reflux disease without esophagitis: Secondary | ICD-10-CM | POA: Diagnosis not present

## 2017-06-09 MED ORDER — FLUTICASONE PROPIONATE 50 MCG/ACT NA SUSP: NASAL | 6 refills | Status: DC

## 2017-06-09 MED ORDER — FLUTICASONE-SALMETEROL 230-21 MCG/ACT IN AERO: INHALATION_SPRAY | RESPIRATORY_TRACT | 3 refills | Status: DC

## 2017-06-09 MED ORDER — MONTELUKAST SODIUM 10 MG PO TABS: ORAL_TABLET | ORAL | 1 refills | Status: DC

## 2017-06-09 NOTE — Progress Notes (Signed)
[place] [address] Dept: [phone]  FAMILY NURSE PRACTITIONER FOLLOW UP NOTE  Patient ID: Ashley Villarreal, female    DOB: 14-Feb-1956  Age: 61 y.o. MRN: [id number] Date of Office Visit: 06/09/2017  Assessment  Chief Complaint: Follow-up (Pt presents for follow up on asthma. Pt states she has been well controlled.)  HPI  Ashley Villarreal is a 61 year old female patient who presents to the clinic today for follow-up.  She was last seen in this office 6//2018 by Dr. Neldon Mc for evaluation her asthma and allergic rhinoconjunctivitis, and sleep apnea.  At that time, her asthma was very well controlled with Advair HFA 230-21, Qvar 80, and a monthly Xolair injection.  She also reported an improvement in her upper airway by decreasing the pressure on her CPAP machine.  At this visit, she reports her asthma is well controlled.  She denies shortness of breath, cough, and nighttime awakenings, and activity limitation due to asthma symptoms.  She has not needed to visit the emergency department or urgent care nor has she received an antibiotic or prednisone for her asthma symptoms since her last visit.  She is currently using Advair 230-21 two puffs twice a day, Xolair injections once a month, and montelukast 10 mg daily.  She has not used Qvar 80 since her last visit here 6 months ago.  She reports her rhinitis is well controlled fluticasone nasal spray 2 sprays in each nostril once a day.  She reports that she can hear better, taste better but she still cannot smell.  Reports she lost her sense of smell about 1 year ago  GERD is reported as well controlled.  She currently takes one omeprazole-sodium bicarb tablet daily with good result.  Drug Allergies:  Allergies  Allergen Reactions  . Claritin [Loratadine]     dizziness  . Doxycycline Nausea Only  . Other Other (See Comments)    Magic mouthwash  Patient get blisters on her lips    Physical Exam: BP (!) 144/86 (BP  Location: Left Arm, Patient Position: Sitting, Cuff Size: Normal)   Pulse 87   Ht 5\' 8"  (1.727 m)   Wt 161 lb 3.2 oz (73.1 kg)   SpO2 97%   BMI 24.51 kg/m    Physical Exam  Constitutional: She is oriented to person, place, and time. She appears well-developed and well-nourished.  HENT:  Right Ear: External ear normal.  Left Ear: External ear normal.  Mouth/Throat: Oropharynx is clear and moist.  Bilateral nares erythematous and edematous with no drainage noted.  Eyes: Conjunctivae are normal.  Neck: Normal range of motion. Neck supple.  Cardiovascular: Normal rate, regular rhythm and normal heart sounds.  S1-S2 normal.  Regular heart rate and rhythm.  No murmur noted  Pulmonary/Chest: Effort normal and breath sounds normal.  Lungs clear to auscultation  Musculoskeletal: Normal range of motion.  Neurological: She is alert and oriented to person, place, and time.  Skin: Skin is warm and dry.  Psychiatric: She has a normal mood and affect. Her behavior is normal.    Diagnostics: FEV1 1.62, FVC 2.11.  Predicted FEV1 2.47, predicted FVC 3.14. Spirometry indicates mild restriction.   Assessment and Plan: 1. Severe persistent asthma without complication   2. Other allergic rhinitis   3. Sleep apnea, obstructive   4. LPRD (laryngopharyngeal reflux disease)     Meds ordered this encounter  Medications  . fluticasone-salmeterol (ADVAIR HFA) 230-21 MCG/ACT inhaler    Sig: INHALE TWO PUFFS TWICE  DAILY TO PREVENT COUGH OR WHEEZE. RINSE, GARGLE, AND SPIT AFTER USE.    Dispense:  36 Inhaler    Refill:  3  . fluticasone (FLONASE) 50 MCG/ACT nasal spray    Sig: PLACE 2 SPRAYS INTO BOTH NOSTRILS DAILY.    Dispense:  16 g    Refill:  6  . montelukast (SINGULAIR) 10 MG tablet    Sig: TAKE ONE TABLET ONCE DAILY AS DIRECTED.    Dispense:  90 tablet    Refill:  1    1. Continue Advair 230 two inhalations two times per day  2. Add Qvar 80 two inhalations twice a day during increased  asthma activity.  3. Continue Zegerid 40 mg daily  4. Consistently use Flonase one spray each nostril once a day  5. Continue montelukast 10 mg tablet 1 time per day  6.  Continue CPAP  7. Continue Xolair and EpiPen  8. Return to clinic in 6 months or earlier if problem   Return in about 6 months (around 12/08/2017), or if symptoms worsen or fail to improve.    Ashley Villarreal's asthma, allergic rhinoconjunctivitis, and GERD have been well controlled with the use of her current medication regimen including Xolair injections. She has been consistent with the use of steroid nasal spray and reports improvement in her hearing but no return of her sense of smell. I will see her back in this office in 6 months or sooner if needed.  Thank you for the opportunity to care for this patient.  Please do not hesitate to contact me with questions.  Gareth Morgan, FNP Allergy and [place] [facility]  I have provided oversight concerning Gareth Morgan' evaluation and treatment of this patient's health issues addressed during today's encounter. I agree with the assessment and therapeutic plan as outlined in the note.   Signed,   Jiles Prows, MD,  Allergy and Immunology,  [facility].

## 2017-06-09 NOTE — Patient Instructions (Signed)
  1. Continue Advair 230 two inhalations two times per day  2. Add Qvar 80 two inhalations twice a day during increased asthma activity.  3. Continue Zegerid 40 mg daily  4. Consistently use Flonase one spray each nostril once a day  5. Continue montelukast 10 mg tablet 1 time per day  6.  Continue CPAP  7. Continue Xolair and EpiPen  8. Return to clinic in 6 months or earlier if problem

## 2017-06-25 ENCOUNTER — Ambulatory Visit (INDEPENDENT_AMBULATORY_CARE_PROVIDER_SITE_OTHER): Payer: 59

## 2017-06-25 DIAGNOSIS — J4550 Severe persistent asthma, uncomplicated: Secondary | ICD-10-CM

## 2017-07-08 ENCOUNTER — Other Ambulatory Visit: Payer: Self-pay | Admitting: Allergy and Immunology

## 2017-07-08 DIAGNOSIS — K219 Gastro-esophageal reflux disease without esophagitis: Secondary | ICD-10-CM

## 2017-07-23 ENCOUNTER — Ambulatory Visit (INDEPENDENT_AMBULATORY_CARE_PROVIDER_SITE_OTHER): Payer: 59

## 2017-07-23 DIAGNOSIS — J4550 Severe persistent asthma, uncomplicated: Secondary | ICD-10-CM

## 2017-08-13 DIAGNOSIS — G4733 Obstructive sleep apnea (adult) (pediatric): Secondary | ICD-10-CM | POA: Diagnosis not present

## 2017-08-20 ENCOUNTER — Ambulatory Visit (INDEPENDENT_AMBULATORY_CARE_PROVIDER_SITE_OTHER): Payer: 59 | Admitting: *Deleted

## 2017-08-20 DIAGNOSIS — J4550 Severe persistent asthma, uncomplicated: Secondary | ICD-10-CM

## 2017-08-24 DIAGNOSIS — G4733 Obstructive sleep apnea (adult) (pediatric): Secondary | ICD-10-CM | POA: Diagnosis not present

## 2017-09-01 ENCOUNTER — Other Ambulatory Visit: Payer: Self-pay | Admitting: Allergy and Immunology

## 2017-09-14 DIAGNOSIS — L818 Other specified disorders of pigmentation: Secondary | ICD-10-CM | POA: Diagnosis not present

## 2017-09-14 DIAGNOSIS — L821 Other seborrheic keratosis: Secondary | ICD-10-CM | POA: Diagnosis not present

## 2017-09-14 DIAGNOSIS — D2239 Melanocytic nevi of other parts of face: Secondary | ICD-10-CM | POA: Diagnosis not present

## 2017-09-20 DIAGNOSIS — N2581 Secondary hyperparathyroidism of renal origin: Secondary | ICD-10-CM | POA: Diagnosis not present

## 2017-09-20 DIAGNOSIS — D631 Anemia in chronic kidney disease: Secondary | ICD-10-CM | POA: Diagnosis not present

## 2017-09-20 DIAGNOSIS — N183 Chronic kidney disease, stage 3 (moderate): Secondary | ICD-10-CM | POA: Diagnosis not present

## 2017-09-20 DIAGNOSIS — I129 Hypertensive chronic kidney disease with stage 1 through stage 4 chronic kidney disease, or unspecified chronic kidney disease: Secondary | ICD-10-CM | POA: Diagnosis not present

## 2017-09-20 DIAGNOSIS — R801 Persistent proteinuria, unspecified: Secondary | ICD-10-CM | POA: Diagnosis not present

## 2017-09-24 ENCOUNTER — Ambulatory Visit (INDEPENDENT_AMBULATORY_CARE_PROVIDER_SITE_OTHER): Payer: 59

## 2017-09-24 DIAGNOSIS — J4550 Severe persistent asthma, uncomplicated: Secondary | ICD-10-CM

## 2017-10-22 ENCOUNTER — Ambulatory Visit: Payer: Self-pay

## 2017-10-22 ENCOUNTER — Ambulatory Visit (INDEPENDENT_AMBULATORY_CARE_PROVIDER_SITE_OTHER): Payer: 59

## 2017-10-22 DIAGNOSIS — J4550 Severe persistent asthma, uncomplicated: Secondary | ICD-10-CM

## 2017-10-28 DIAGNOSIS — G4733 Obstructive sleep apnea (adult) (pediatric): Secondary | ICD-10-CM | POA: Diagnosis not present

## 2017-11-14 NOTE — Progress Notes (Addendum)
[place] at [facility] [address] [place], [place] [address] [phone]  Date:  11/17/2017   Name:  Ashley Villarreal   DOB:  08/08/1955   MRN:  [id number]  PCP:  Darreld Mclean, MD    Chief Complaint: Diabetes (follow up, 6 months) and Clogged Ears   History of Present Illness:  Ashley Villarreal is a 62 y.o. very pleasant female patient who presents with the following:  Following up today History of HTN, DM, asthma, GERD, hyperlipidemia Well controlled DM on metformin 500 BID On losartan for her HTN Simvastatin for her lipids She is undergoing immunotherapy per allergy/ asthma.  She notes that her sx are under ok control right now However she feels like her ears are stopped up a lot of the time and was not sure if she has wax in her ears. Her ears are itchy but not painful. She notes that her hearing seems to be getting worse, in the am will be not as bad, hearing gets worse as the day goes on. She has been getting concerned about this and would like to see ENT.  She feels like she might need to get hearing aids soon  She will exercise indoors or late/ early to avoid the heat  She is fasting today for labs  She also sees pulmonology who manages her CPAP/ OSA  Lab Results  Component Value Date   HGBA1C 6.7 (H) 05/17/2017   Last seen by myself in November: She is still exercising a lot.  She continues to walk/ run about 6 miles a day 7 days of the week Her joints are doing fine even with all her running and walking She notes that her mood is good She uses CPAP for her OSA-  She has noted a poor sense of smell since she started on her CPAP 3 years ago. othewise she has no concerns today She does see pulmonology for management of her OSA- Dr. Neldon Mc  Needs A1c today Will do foot exam to stay caught up Dr. Lorenda Cahill is her GYN  Her father passed away yesterday- she is planning to travel to [place] later on today  to be with her family. She feels like she is handing this as would be expected   Patient Active Problem List   Diagnosis Date Noted  . Gastroesophageal reflux disease without esophagitis 06/09/2017  . Anosmia 06/09/2017  . Asthma, severe persistent, well-controlled 06/12/2015  . LPRD (laryngopharyngeal reflux disease) 06/12/2015  . Allergic rhinoconjunctivitis 06/12/2015  . Allergy with anaphylaxis due to food 06/12/2015  . Elevated serum creatinine 05/01/2015  . Sleep apnea, obstructive   . Hyperlipemia 08/10/2011  . Hypertensive heart disease   . Diabetes mellitus type 2, noninsulin dependent ([place])     Past Medical History:  Diagnosis Date  . Allergy   . Asthma   . COPD (chronic obstructive pulmonary disease) ([place])   . Diabetes mellitus without complication ([place])   . Hypertension   . Sleep apnea    uses CPAP    Past Surgical History:  Procedure Laterality Date  . COLONOSCOPY    . DILATION AND CURETTAGE OF UTERUS    . NASAL SINUS SURGERY    . TONSILLECTOMY  1975    Social History   Tobacco Use  . Smoking status: Never Smoker  . Smokeless tobacco: Never Used  Substance Use Topics  . Alcohol use: No    Alcohol/week: 0.0 oz  .  Drug use: No    Family History  Problem Relation Age of Onset  . Stroke Father   . Heart disease Father   . Diabetes Mother   . Heart disease Mother   . Stroke Mother   . Hyperlipidemia Sister   . Hyperlipidemia Brother   . Diabetes Paternal Grandmother   . Hypertension Brother   . Colon cancer Neg Hx     Allergies  Allergen Reactions  . Claritin [Loratadine]     dizziness  . Doxycycline Nausea Only  . Other Other (See Comments)    Magic mouthwash  Patient get blisters on her lips    Medication list has been reviewed and updated.  Current Outpatient Medications on File Prior to Visit  Medication Sig Dispense Refill  . adapalene (DIFFERIN) 0.1 % gel     . beclomethasone (QVAR) 80 MCG/ACT inhaler Inhale 2 puffs into the  lungs 2 (two) times daily. 1 Inhaler 5  . EPINEPHrine (EPIPEN 2-PAK) 0.3 mg/0.3 mL IJ SOAJ injection Inject 0.3 mLs (0.3 mg total) into the muscle once. 2 Device 1  . fluticasone (FLONASE) 50 MCG/ACT nasal spray PLACE 2 SPRAYS INTO BOTH NOSTRILS DAILY. 16 g 1  . fluticasone (FLONASE) 50 MCG/ACT nasal spray PLACE 2 SPRAYS INTO BOTH NOSTRILS DAILY. 16 g 6  . fluticasone-salmeterol (ADVAIR HFA) 230-21 MCG/ACT inhaler INHALE TWO PUFFS TWICE DAILY TO PREVENT COUGH OR WHEEZE. RINSE, GARGLE, AND SPIT AFTER USE. 36 Inhaler 3  . HYDROCORTISONE IN ABSORBASE 1 % ointment     . Hydroquinone-Sunscreens (ALPHAQUIN HP) 4 % CREA APPLY TO DARK SPOTS DAILY.  3  . losartan (COZAAR) 100 MG tablet TAKE 1 TABLET (100 MG TOTAL) BY MOUTH DAILY. 90 tablet 3  . metFORMIN (GLUCOPHAGE) 500 MG tablet TAKE 1 TABLET (500 MG TOTAL) BY MOUTH 2 (TWO) TIMES DAILY WITH A MEAL. 180 tablet 3  . montelukast (SINGULAIR) 10 MG tablet TAKE ONE TABLET ONCE DAILY AS DIRECTED. 90 tablet 1  . omalizumab (XOLAIR) 150 MG injection Inject 150 mg into the skin every 28 (twenty-eight) days. 1 each 11  . omeprazole-sodium bicarbonate (ZEGERID) 40-1100 MG capsule TAKE 1 CAPSULE BY MOUTH DAILY BEFORE BREAKFAST. 90 capsule 1  . ONE TOUCH ULTRA TEST test strip PLEASE CHECK BLOOD SUGAR DAILY. DX CODE: E11.9 100 each 6  . Scar Treatment Products (BEAU RX) GEL     . simvastatin (ZOCOR) 20 MG tablet TAKE ONE DAILY FOR CHOLESTEROL 90 tablet 3  . Water For Injection Sterile (STERILE WATER, PRESERVATIVE FREE,) injection Inject 10 mLs as directed as directed. 10 mL 11  . XOLAIR 150 MG injection INJECT 150 MG SUBCUTANEOUSLY EVERY 4 WEEKS 1 each 11   Current Facility-Administered Medications on File Prior to Visit  Medication Dose Route Frequency Provider Last Rate Last Dose  . omalizumab Arvid Right) injection 150 mg  150 mg Subcutaneous Q28 days Jiles Prows, MD   150 mg at 10/22/17 1202    Review of Systems:  As per HPI- otherwise negative. No fever or  chills No CP or SOB No rash    Physical Examination: Vitals:   11/17/17 0858  BP: 132/86  Pulse: 77  Resp: 16  SpO2: 98%   Vitals:   11/17/17 0858  Weight: 156 lb (70.8 kg)  Height: 5\' 8"  (1.727 m)   Body mass index is 23.72 kg/m. Ideal Body Weight: Weight in (lb) to have BMI = 25: 164.1  GEN: WDWN, NAD, Non-toxic, A & O x 3, normal weight, looks well  and younger than age 57: Atraumatic, Normocephalic. Neck supple. No masses, No LAD.  Bilateral TM wnl, oropharynx normal.  PEERL,EOMI.   No cerumen in ear canals unfortunately  Ears and Nose: No external deformity. CV: RRR, No M/G/R. No JVD. No thrill. No extra heart sounds. PULM: CTA B, no wheezes, crackles, rhonchi. No retractions. No resp. distress. No accessory muscle use. EXTR: No c/c/e NEURO Normal gait.  PSYCH: Normally interactive. Conversant. Not depressed or anxious appearing.  Calm demeanor.  Foot exam today   Assessment and Plan: Controlled type 2 diabetes mellitus with diabetic nephropathy, without long-term current use of insulin ([facility]) - Plan: Hemoglobin A1c, Comprehensive metabolic panel  Essential hypertension - Plan: CBC  Hyperlipidemia, unspecified hyperlipidemia type - Plan: Lipid panel  Hearing loss, unspecified hearing loss type, unspecified laterality - Plan: Ambulatory referral to ENT  Asthma, severe persistent, well-controlled - Plan: montelukast (SINGULAIR) 10 MG tablet  Follow-up visit today Labs pending as above Pt brought in a wellness form that needs to be completed and faxed once labs are received  Referral to ENT regarding her ear issues and hearing loss Will plan further follow- up pending labs. Plan to visit in 6 months   Signed Lamar Blinks, MD  Received her labs from today  Results for orders placed or performed in visit on 11/17/17  Hemoglobin A1c  Result Value Ref Range   Hgb A1c MFr Bld 6.5 4.6 - 6.5 %  CBC  Result Value Ref Range   WBC 6.4 4.0 - 10.5 K/uL   RBC  4.36 3.87 - 5.11 Mil/uL   Platelets 236.0 150.0 - 400.0 K/uL   Hemoglobin 11.7 (L) 12.0 - 15.0 g/dL   HCT 36.2 36.0 - 46.0 %   MCV 83.1 78.0 - 100.0 fl   MCHC 32.4 30.0 - 36.0 g/dL   RDW 15.6 (H) 11.5 - 15.5 %  Comprehensive metabolic panel  Result Value Ref Range   Sodium 139 135 - 145 mEq/L   Potassium 4.3 3.5 - 5.1 mEq/L   Chloride 102 96 - 112 mEq/L   CO2 30 19 - 32 mEq/L   Glucose, Bld 92 70 - 99 mg/dL   BUN 13 6 - 23 mg/dL   Creatinine, Ser 1.15 0.40 - 1.20 mg/dL   Total Bilirubin 0.5 0.2 - 1.2 mg/dL   Alkaline Phosphatase 72 39 - 117 U/L   AST 19 0 - 37 U/L   ALT 20 0 - 35 U/L   Total Protein 7.4 6.0 - 8.3 g/dL   Albumin 4.2 3.5 - 5.2 g/dL   Calcium 9.7 8.4 - 10.5 mg/dL   GFR 61.77 >60.00 mL/min  Lipid panel  Result Value Ref Range   Cholesterol 178 0 - 200 mg/dL   Triglycerides 85.0 0.0 - 149.0 mg/dL   HDL 48.00 >39.00 mg/dL   VLDL 17.0 0.0 - 40.0 mg/dL   LDL Cholesterol 113 (H) 0 - 99 mg/dL   Total CHOL/HDL Ratio 4    NonHDL 129.88    Message to pt Form completed and given to [place] to fax

## 2017-11-17 ENCOUNTER — Ambulatory Visit: Payer: 59 | Admitting: Family Medicine

## 2017-11-17 ENCOUNTER — Encounter: Payer: Self-pay | Admitting: Family Medicine

## 2017-11-17 VITALS — BP 132/86 | HR 77 | Resp 16 | Ht 68.0 in | Wt 156.0 lb

## 2017-11-17 DIAGNOSIS — J4550 Severe persistent asthma, uncomplicated: Secondary | ICD-10-CM | POA: Diagnosis not present

## 2017-11-17 DIAGNOSIS — H9190 Unspecified hearing loss, unspecified ear: Secondary | ICD-10-CM

## 2017-11-17 DIAGNOSIS — I10 Essential (primary) hypertension: Secondary | ICD-10-CM | POA: Diagnosis not present

## 2017-11-17 DIAGNOSIS — E785 Hyperlipidemia, unspecified: Secondary | ICD-10-CM | POA: Diagnosis not present

## 2017-11-17 DIAGNOSIS — E1121 Type 2 diabetes mellitus with diabetic nephropathy: Secondary | ICD-10-CM | POA: Diagnosis not present

## 2017-11-17 LAB — COMPREHENSIVE METABOLIC PANEL
ALBUMIN: 4.2 g/dL (ref 3.5–5.2)
ALT: 20 U/L (ref 0–35)
AST: 19 U/L (ref 0–37)
Alkaline Phosphatase: 72 U/L (ref 39–117)
BUN: 13 mg/dL (ref 6–23)
CHLORIDE: 102 meq/L (ref 96–112)
CO2: 30 mEq/L (ref 19–32)
Calcium: 9.7 mg/dL (ref 8.4–10.5)
Creatinine, Ser: 1.15 mg/dL (ref 0.40–1.20)
GFR: 61.77 mL/min (ref >60.00)
Glucose, Bld: 92 mg/dL (ref 70–99)
POTASSIUM: 4.3 meq/L (ref 3.5–5.1)
SODIUM: 139 meq/L (ref 135–145)
Total Bilirubin: 0.5 mg/dL (ref 0.2–1.2)
Total Protein: 7.4 g/dL (ref 6.0–8.3)

## 2017-11-17 LAB — LIPID PANEL
Cholesterol: 178 mg/dL (ref 0–200)
HDL: 48 mg/dL (ref >39.00)
LDL CALC: 113 mg/dL — AB (ref 0–99)
NonHDL: 129.88
Total CHOL/HDL Ratio: 4
Triglycerides: 85 mg/dL (ref 0.0–149.0)
VLDL: 17 mg/dL (ref 0.0–40.0)

## 2017-11-17 LAB — CBC
HEMATOCRIT: 36.2 % (ref 36.0–46.0)
HEMOGLOBIN: 11.7 g/dL — AB (ref 12.0–15.0)
MCHC: 32.4 g/dL (ref 30.0–36.0)
MCV: 83.1 fl (ref 78.0–100.0)
Platelets: 236 10*3/uL (ref 150.0–400.0)
RBC: 4.36 Mil/uL (ref 3.87–5.11)
RDW: 15.6 % — AB (ref 11.5–15.5)
WBC: 6.4 10*3/uL (ref 4.0–10.5)

## 2017-11-17 LAB — HEMOGLOBIN A1C: Hgb A1c MFr Bld: 6.5 % (ref 4.6–6.5)

## 2017-11-17 MED ORDER — MONTELUKAST SODIUM 10 MG PO TABS: ORAL_TABLET | ORAL | 1 refills | Status: DC

## 2017-11-17 NOTE — Patient Instructions (Signed)
It was great to see you today although I am so sorry to hear about the loss of your father I will be in touch with your labs and will complete/ fax your form once your labs come in  Take care and please see me in about 6 months Let me know if any problems in the meantime

## 2017-11-24 ENCOUNTER — Telehealth: Payer: Self-pay | Admitting: Family Medicine

## 2017-11-24 NOTE — Telephone Encounter (Signed)
Copied from [place] [phone]. Topic: Quick Communication - See Telephone Encounter >> Nov 24, 2017  2:16 PM Ether Griffins B wrote: CRM for notification. See Telephone encounter for: 11/24/17.  Pt is calling about ppw that was filled out on 11/17/17. She is needing the date she completed the test and the results on the paper as well. 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 And one section was not completed. If the original form is not on her file she will bring another one by to be filled out. Please advise.

## 2017-11-25 ENCOUNTER — Encounter: Payer: Self-pay | Admitting: Family Medicine

## 2017-11-26 ENCOUNTER — Ambulatory Visit (INDEPENDENT_AMBULATORY_CARE_PROVIDER_SITE_OTHER): Payer: 59 | Admitting: *Deleted

## 2017-11-26 DIAGNOSIS — J4550 Severe persistent asthma, uncomplicated: Secondary | ICD-10-CM | POA: Diagnosis not present

## 2017-11-30 ENCOUNTER — Encounter: Payer: Self-pay | Admitting: Allergy and Immunology

## 2017-11-30 ENCOUNTER — Ambulatory Visit: Payer: 59 | Admitting: Allergy and Immunology

## 2017-11-30 VITALS — BP 162/76 | HR 86 | Resp 20

## 2017-11-30 DIAGNOSIS — J4550 Severe persistent asthma, uncomplicated: Secondary | ICD-10-CM | POA: Diagnosis not present

## 2017-11-30 DIAGNOSIS — K219 Gastro-esophageal reflux disease without esophagitis: Secondary | ICD-10-CM

## 2017-11-30 DIAGNOSIS — J3089 Other allergic rhinitis: Secondary | ICD-10-CM | POA: Diagnosis not present

## 2017-11-30 MED ORDER — OMEPRAZOLE-SODIUM BICARBONATE 40-1100 MG PO CAPS: 1.0000 | ORAL_CAPSULE | Freq: Every day | ORAL | 3 refills | Status: DC

## 2017-11-30 MED ORDER — FLUTICASONE PROPIONATE 50 MCG/ACT NA SUSP: NASAL | 3 refills | Status: DC

## 2017-11-30 MED ORDER — BECLOMETHASONE DIPROPIONATE 80 MCG/ACT IN AERS: 2.0000 | INHALATION_SPRAY | Freq: Two times a day (BID) | RESPIRATORY_TRACT | 3 refills | Status: DC

## 2017-11-30 MED ORDER — LEVALBUTEROL TARTRATE 45 MCG/ACT IN AERO: 2.0000 | INHALATION_SPRAY | Freq: Four times a day (QID) | RESPIRATORY_TRACT | 3 refills | Status: DC | PRN

## 2017-11-30 MED ORDER — MONTELUKAST SODIUM 10 MG PO TABS: ORAL_TABLET | ORAL | 1 refills | Status: DC

## 2017-11-30 NOTE — Patient Instructions (Addendum)
  1. Continue Advair 230 two inhalations two times per day  2. Add Qvar 80 two inhalations twice a day during increased asthma activity.  3. Continue Zegerid 40 mg daily  4. Consistently use Flonase one spray each nostril once a day  5. Continue montelukast 10 mg tablet 1 time per day  6.  Continue Xolair and EpiPen  7.  Continue Xopenex HFA if needed  8. Return to clinic in 6 months or earlier if problem  9. Obtain fall flu vaccine

## 2017-11-30 NOTE — Progress Notes (Signed)
Follow-up Note  Referring Provider: Darreld Mclean, MD Primary Provider: Darreld Mclean, MD Date of Office Visit: 11/30/2017  Subjective:   Ashley Villarreal (DOB: Mar 17, 1956) is a 62 y.o. female who returns to the [place] on 11/30/2017 in re-evaluation of the following:  HPI: Ashley Villarreal presents to this clinic in evaluation of her asthma and allergic rhinoconjunctivitis and a history of sleep apnea.  Her last visit to this clinic was 09 June 2017.  She has had excellent control of her respiratory tract condition without the requirement for systemic steroid or antibiotic and rarely uses a short acting bronchodilator and can exercise without any problem.  She continues to use Advair and Xolair and montelukast and Flonase on a consistent basis.  She has not had to activate her action plan which includes the addition of Qvar.  Her reflux is under excellent control at this point in time while continuing to use her proton pump inhibitor.  Her sleep apnea is appropriately treated with CPAP at this point.  Allergies as of 11/30/2017      Reactions   Claritin [loratadine]    dizziness   Doxycycline Nausea Only   Other Other (See Comments)   Magic mouthwash  Patient get blisters on her lips      Medication List      adapalene 0.1 % gel Commonly known as:  DIFFERIN   ALPHAQUIN HP 4 % Crea APPLY TO DARK SPOTS DAILY.   BEAU RX Gel   beclomethasone 80 MCG/ACT inhaler Commonly known as:  QVAR Inhale 2 puffs into the lungs 2 (two) times daily.   EPINEPHrine 0.3 mg/0.3 mL Soaj injection Commonly known as:  EPIPEN 2-PAK Inject 0.3 mLs (0.3 mg total) into the muscle once.   fluticasone 50 MCG/ACT nasal spray Commonly known as:  FLONASE PLACE 2 SPRAYS INTO BOTH NOSTRILS DAILY.   fluticasone-salmeterol 230-21 MCG/ACT inhaler Commonly known as:  ADVAIR HFA INHALE TWO PUFFS TWICE DAILY TO PREVENT COUGH OR WHEEZE. RINSE, GARGLE, AND SPIT AFTER USE.     HYDROCORTISONE IN ABSORBASE 1 % ointment Generic drug:  hydrocortisone   levalbuterol 45 MCG/ACT inhaler Commonly known as:  XOPENEX HFA Inhale 2 puffs into the lungs every 6 (six) hours as needed for wheezing.   losartan 100 MG tablet Commonly known as:  COZAAR TAKE 1 TABLET (100 MG TOTAL) BY MOUTH DAILY.   metFORMIN 500 MG tablet Commonly known as:  GLUCOPHAGE TAKE 1 TABLET (500 MG TOTAL) BY MOUTH 2 (TWO) TIMES DAILY WITH A MEAL.   montelukast 10 MG tablet Commonly known as:  SINGULAIR TAKE ONE TABLET ONCE DAILY AS DIRECTED.   omalizumab 150 MG injection Commonly known as:  XOLAIR Inject 150 mg into the skin every 28 (twenty-eight) days.   omeprazole-sodium bicarbonate 40-1100 MG capsule Commonly known as:  ZEGERID Take 1 capsule by mouth daily before breakfast.   ONE TOUCH ULTRA TEST test strip Generic drug:  glucose blood PLEASE CHECK BLOOD SUGAR DAILY. DX CODE: E11.9   simvastatin 20 MG tablet Commonly known as:  ZOCOR TAKE ONE DAILY FOR CHOLESTEROL   sterile water (preservative free) injection Inject 10 mLs as directed as directed.       Past Medical History:  Diagnosis Date  . Allergy   . Asthma   . COPD (chronic obstructive pulmonary disease) ([place])   . Diabetes mellitus without complication ([place])   . Hypertension   . Sleep apnea    uses CPAP  Past Surgical History:  Procedure Laterality Date  . COLONOSCOPY    . DILATION AND CURETTAGE OF UTERUS    . NASAL SINUS SURGERY    . TONSILLECTOMY  1975    Review of systems negative except as noted in HPI / PMHx or noted below:  Review of Systems  Constitutional: Negative.   HENT: Negative.   Eyes: Negative.   Respiratory: Negative.   Cardiovascular: Negative.   Gastrointestinal: Negative.   Genitourinary: Negative.   Musculoskeletal: Negative.   Skin: Negative.   Neurological: Negative.   Endo/Heme/Allergies: Negative.   Psychiatric/Behavioral: Negative.      Objective:   Vitals:    11/30/17 1029  BP: (!) 162/76  Pulse: 86  Resp: 20  SpO2: 99%          Physical Exam  HENT:  Head: Normocephalic.  Right Ear: Tympanic membrane, external ear and ear canal normal.  Left Ear: Tympanic membrane, external ear and ear canal normal.  Nose: Nose normal. No mucosal edema or rhinorrhea.  Mouth/Throat: Uvula is midline, oropharynx is clear and moist and mucous membranes are normal. No oropharyngeal exudate.  Eyes: Conjunctivae are normal.  Neck: Trachea normal. No tracheal tenderness present. No tracheal deviation present. No thyromegaly present.  Cardiovascular: Normal rate, regular rhythm, S1 normal, S2 normal and normal heart sounds.  No murmur heard. Pulmonary/Chest: Breath sounds normal. No stridor. No respiratory distress. She has no wheezes. She has no rales.  Musculoskeletal: She exhibits no edema.  Lymphadenopathy:       Head (right side): No tonsillar adenopathy present.       Head (left side): No tonsillar adenopathy present.    She has no cervical adenopathy.  Neurological: She is alert.  Skin: No rash noted. She is not diaphoretic. No erythema. Nails show no clubbing.    Diagnostics:    Spirometry was performed and demonstrated an FEV1 of 1.74 at 72 % of predicted.  The patient had an Asthma Control Test with the following results: ACT Total Score: 25.    Assessment and Plan:   1. Asthma, severe persistent, well-controlled   2. Other allergic rhinitis   3. Gastroesophageal reflux disease without esophagitis     1. Continue Advair 230 two inhalations two times per day  2. Add Qvar 80 two inhalations twice a day during increased asthma activity.  3. Continue Zegerid 40 mg daily  4. Consistently use Flonase one spray each nostril once a day  5. Continue montelukast 10 mg tablet 1 time per day  6.  Continue Xolair and EpiPen  7.  Continue Xopenex HFA if needed  8. Return to clinic in 6 months or earlier if problem  9. Obtain fall flu  vaccine  Ashley Villarreal appears to be doing very well on her current therapy and I will continue to have her use anti-inflammatory agents for respiratory tract and therapy directed against reflux and continue on omalizumab and I will see her back in this clinic in 6 months or earlier if there is a problem.  She will obtain the flu vaccine this fall.  Allena Katz, MD Allergy / Immunology [place]

## 2017-12-01 ENCOUNTER — Telehealth: Payer: Self-pay | Admitting: Family Medicine

## 2017-12-01 ENCOUNTER — Encounter: Payer: Self-pay | Admitting: Allergy and Immunology

## 2017-12-01 DIAGNOSIS — G4733 Obstructive sleep apnea (adult) (pediatric): Secondary | ICD-10-CM | POA: Diagnosis not present

## 2017-12-01 NOTE — Telephone Encounter (Signed)
Copied from [place] [phone]. Topic: Quick Communication - See Telephone Encounter >> Nov 24, 2017  2:16 PM Ether Griffins B wrote: CRM for notification. See Telephone encounter for: 11/24/17.  Pt is calling about ppw that was filled out on 11/17/17. She is needing the date she completed the test and the results on the paper as well. 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 And one section was not completed. If the original form is not on her file she will bring another one by to be filled out. Please advise.  >> Dec 01, 2017  1:45 PM Cleaster Corin, NT wrote: Pt. Calling to check on the status of paperwork that was supposed to be sent to her insurance company. Pt. stating that her insurance company said that the date of service wasn't put on the paper and it was sent back. Pt. Has the paperwork and can bring it by the office she would just like for someone to give her a callback.

## 2017-12-02 NOTE — Telephone Encounter (Signed)
Form has been refaxed with appropriate dates on them. Patient notified via voicemail.

## 2017-12-06 ENCOUNTER — Encounter: Payer: Self-pay | Admitting: Internal Medicine

## 2017-12-07 ENCOUNTER — Ambulatory Visit: Payer: 59 | Admitting: Internal Medicine

## 2017-12-07 ENCOUNTER — Encounter: Payer: Self-pay | Admitting: Internal Medicine

## 2017-12-07 VITALS — BP 138/86 | HR 84 | Ht 68.0 in | Wt 157.2 lb

## 2017-12-07 DIAGNOSIS — J4550 Severe persistent asthma, uncomplicated: Secondary | ICD-10-CM

## 2017-12-07 DIAGNOSIS — R430 Anosmia: Secondary | ICD-10-CM

## 2017-12-07 DIAGNOSIS — G4733 Obstructive sleep apnea (adult) (pediatric): Secondary | ICD-10-CM

## 2017-12-07 NOTE — Assessment & Plan Note (Signed)
This would not be a common complaint with CPAP.  I told her we could try stopping CPAP for a couple of weeks to see if that made a difference.  We also talked about oral appliances as an alternative to CPAP.  She chooses to continue CPAP for now.

## 2017-12-07 NOTE — Assessment & Plan Note (Signed)
She describes mild intermittent uncomplicated asthma at most now as she continues good management with Dr. Neldon Mc.

## 2017-12-07 NOTE — Patient Instructions (Signed)
We can continue CPAP 8, mask of choice, humidifier, supplies AirView  Please call if we can help

## 2017-12-07 NOTE — Assessment & Plan Note (Signed)
She continues to benefit with improved sleep and prevention of snoring.  She is comfortable with pressure at 8.  Nasal exam does not show polyps.  Her CPAP pressure is relatively low and complaints with the ears and sense of smell are not common with CPAP so I suspect there is another issue going on.

## 2017-12-07 NOTE — Progress Notes (Signed)
HPI female never smoker followed for OSA, complicated by HBP, asthma/Xolair (Dr. Neldon Mc) NPSG 02/11/15- AHI 12.5 per hour, CPAP to 9   -------------------------------------------------------------------------------- 12/07/16-62 year old female never smoker followed for OSA, complicated by HBP, asthma/Xolair (Dr. Neldon Mc) CPAP 9/Advanced> 8 today Asthma: Pt states her breathing is doing well Denies coughing, She states she is using her cpap machine every night, she feels like she is doing well on it DME:[facility] Download 93% compliance averaging 6-7 hours per night, AHI 2.7/hour. Machine is about 61 years old. She is very pleased with CPAP, definitely benefiting. Complains of persistent nasal congestion "sinus". Dr. Neldon Mc gave Singulair which helped for a while. Continues Flonase.  12/07/2017- 62 year old female never smoker followed for OSA, complicated by HBP, asthma/Xolair (Dr. Neldon Mc) CPAP 8/Advanced -----OSA; DME [facility]. Pt states she wears CPAP nightly-pressure works well and no new supplies needed at this time. DL attached.  Download compliance 97% AHI 2.6/hour.  She sleeps better and CPAP prevents her snoring.  She describes decreased sense of smell and taste and is going for ear examination questioning decreased hearing.  ROS-see HPI   + = positive Constitutional:    weight loss, night sweats, fevers, chills, fatigue, lassitude. HEENT:    headaches, difficulty swallowing, tooth/dental problems, sore throat,       sneezing, itching, ear ache, + nasal congestion, post nasal drip, snoring CV:    chest pain, orthopnea, PND, swelling in lower extremities, anasarca,                                                      dizziness, palpitations Resp:   shortness of breath with exertion or at rest.                productive cough,   non-productive cough, coughing up of blood.              change in color of mucus.  wheezing.   Skin:    rash or lesions. GI:  No-   heartburn, indigestion, abdominal pain,  nausea, vomiting,  GU: . MS:   joint pain, stiffness, decreased range of motion, back pain. Neuro-     nothing unusual Psych:  change in mood or affect.  depression or anxiety.   memory loss.  OBJ- Physical Exam General- Alert, Oriented, Affect-appropriate, Distress- none acute, normal weight Skin- rash-none, lesions- none, excoriation- none Lymphadenopathy- none Head- atraumatic            Eyes- Gross vision intact, PERRLA, conjunctivae and secretions clear            Ears- Hearing, canals-normal            Nose- + turbinate edema, no-Septal dev, mucus, polyps, erosion, perforation             Throat- Mallampati III-IV , mucosa clear , drainage- none, tonsils- atrophic Neck- flexible , trachea midline, no stridor , thyroid nl, carotid no bruit Chest - symmetrical excursion , unlabored           Heart/CV- RRR , no murmur , no gallop  , no rub, nl s1 s2                           - JVD- none , edema- none, stasis changes- none, varices- none  Lung- clear to P&A, wheeze- none, cough- none , dullness-none, rub- none           Chest wall-  Abd-  Br/ Gen/ Rectal- Not done, not indicated Extrem- cyanosis- none, clubbing, none, atrophy- none, strength- nl Neuro- grossly intact to observation

## 2017-12-08 DIAGNOSIS — H903 Sensorineural hearing loss, bilateral: Secondary | ICD-10-CM | POA: Insufficient documentation

## 2017-12-08 DIAGNOSIS — R430 Anosmia: Secondary | ICD-10-CM | POA: Diagnosis not present

## 2017-12-08 DIAGNOSIS — R432 Parageusia: Secondary | ICD-10-CM | POA: Diagnosis not present

## 2017-12-15 DIAGNOSIS — J329 Chronic sinusitis, unspecified: Secondary | ICD-10-CM | POA: Diagnosis not present

## 2017-12-15 DIAGNOSIS — H903 Sensorineural hearing loss, bilateral: Secondary | ICD-10-CM | POA: Diagnosis not present

## 2017-12-22 DIAGNOSIS — H903 Sensorineural hearing loss, bilateral: Secondary | ICD-10-CM | POA: Diagnosis not present

## 2017-12-27 ENCOUNTER — Ambulatory Visit (INDEPENDENT_AMBULATORY_CARE_PROVIDER_SITE_OTHER): Payer: 59 | Admitting: *Deleted

## 2017-12-27 DIAGNOSIS — J4550 Severe persistent asthma, uncomplicated: Secondary | ICD-10-CM

## 2017-12-27 DIAGNOSIS — J4540 Moderate persistent asthma, uncomplicated: Secondary | ICD-10-CM | POA: Diagnosis not present

## 2018-01-04 DIAGNOSIS — R87610 Atypical squamous cells of undetermined significance on cytologic smear of cervix (ASC-US): Secondary | ICD-10-CM | POA: Diagnosis not present

## 2018-01-04 DIAGNOSIS — Z01411 Encounter for gynecological examination (general) (routine) with abnormal findings: Secondary | ICD-10-CM | POA: Diagnosis not present

## 2018-01-04 DIAGNOSIS — D259 Leiomyoma of uterus, unspecified: Secondary | ICD-10-CM | POA: Diagnosis not present

## 2018-01-04 DIAGNOSIS — Z01419 Encounter for gynecological examination (general) (routine) without abnormal findings: Secondary | ICD-10-CM | POA: Diagnosis not present

## 2018-01-07 DIAGNOSIS — J330 Polyp of nasal cavity: Secondary | ICD-10-CM | POA: Diagnosis not present

## 2018-01-07 DIAGNOSIS — J324 Chronic pansinusitis: Secondary | ICD-10-CM | POA: Insufficient documentation

## 2018-01-07 DIAGNOSIS — J339 Nasal polyp, unspecified: Secondary | ICD-10-CM | POA: Insufficient documentation

## 2018-01-07 DIAGNOSIS — J343 Hypertrophy of nasal turbinates: Secondary | ICD-10-CM | POA: Diagnosis not present

## 2018-01-11 DIAGNOSIS — H903 Sensorineural hearing loss, bilateral: Secondary | ICD-10-CM | POA: Diagnosis not present

## 2018-01-24 ENCOUNTER — Ambulatory Visit (INDEPENDENT_AMBULATORY_CARE_PROVIDER_SITE_OTHER): Payer: 59 | Admitting: *Deleted

## 2018-01-24 DIAGNOSIS — J4540 Moderate persistent asthma, uncomplicated: Secondary | ICD-10-CM

## 2018-01-24 DIAGNOSIS — J4550 Severe persistent asthma, uncomplicated: Secondary | ICD-10-CM

## 2018-02-22 ENCOUNTER — Ambulatory Visit (INDEPENDENT_AMBULATORY_CARE_PROVIDER_SITE_OTHER): Payer: 59

## 2018-02-22 DIAGNOSIS — J4550 Severe persistent asthma, uncomplicated: Secondary | ICD-10-CM

## 2018-03-03 DIAGNOSIS — G4733 Obstructive sleep apnea (adult) (pediatric): Secondary | ICD-10-CM | POA: Diagnosis not present

## 2018-03-11 DIAGNOSIS — H90A32 Mixed conductive and sensorineural hearing loss, unilateral, left ear with restricted hearing on the contralateral side: Secondary | ICD-10-CM | POA: Diagnosis not present

## 2018-03-11 DIAGNOSIS — H90A21 Sensorineural hearing loss, unilateral, right ear, with restricted hearing on the contralateral side: Secondary | ICD-10-CM | POA: Diagnosis not present

## 2018-03-11 DIAGNOSIS — H903 Sensorineural hearing loss, bilateral: Secondary | ICD-10-CM | POA: Diagnosis not present

## 2018-03-11 DIAGNOSIS — J324 Chronic pansinusitis: Secondary | ICD-10-CM | POA: Diagnosis not present

## 2018-03-11 DIAGNOSIS — H6532 Chronic mucoid otitis media, left ear: Secondary | ICD-10-CM | POA: Insufficient documentation

## 2018-03-11 DIAGNOSIS — J339 Nasal polyp, unspecified: Secondary | ICD-10-CM | POA: Diagnosis not present

## 2018-03-22 ENCOUNTER — Ambulatory Visit (INDEPENDENT_AMBULATORY_CARE_PROVIDER_SITE_OTHER): Payer: 59 | Admitting: *Deleted

## 2018-03-22 ENCOUNTER — Ambulatory Visit: Payer: 59 | Admitting: *Deleted

## 2018-03-22 DIAGNOSIS — J4550 Severe persistent asthma, uncomplicated: Secondary | ICD-10-CM

## 2018-03-22 DIAGNOSIS — J4540 Moderate persistent asthma, uncomplicated: Secondary | ICD-10-CM

## 2018-03-24 ENCOUNTER — Telehealth: Payer: Self-pay

## 2018-03-24 NOTE — Telephone Encounter (Signed)
Received fax for PA for Omeprazole-Sodium Bicarbonate 40. PA has been completed and sent to plan, waiting for a determination.

## 2018-03-24 NOTE — Telephone Encounter (Signed)
Insurance has denied coverage of Zegerid. Please advise and thank you.

## 2018-03-28 MED ORDER — PANTOPRAZOLE SODIUM 40 MG PO TBEC: 40.0000 mg | DELAYED_RELEASE_TABLET | Freq: Every day | ORAL | 5 refills | Status: DC

## 2018-03-28 NOTE — Telephone Encounter (Signed)
Protonix 40 mg has been sent in to specified pharmacy. I spoke with patient and she is aware of denial and new prescription that has been sent in.

## 2018-03-28 NOTE — Telephone Encounter (Signed)
Can we substitute Protonix 40mg  daily instead?  Salvatore Marvel, MD Allergy and [place] [facility] [place]

## 2018-03-28 NOTE — Addendum Note (Signed)
Addended by: Lucrezia Starch I on: 03/28/2018 10:28 AM   Modules accepted: Orders

## 2018-04-05 ENCOUNTER — Ambulatory Visit: Payer: 59

## 2018-04-07 DIAGNOSIS — N189 Chronic kidney disease, unspecified: Secondary | ICD-10-CM | POA: Diagnosis not present

## 2018-04-07 DIAGNOSIS — D631 Anemia in chronic kidney disease: Secondary | ICD-10-CM | POA: Diagnosis not present

## 2018-04-07 DIAGNOSIS — N183 Chronic kidney disease, stage 3 (moderate): Secondary | ICD-10-CM | POA: Diagnosis not present

## 2018-04-07 DIAGNOSIS — I129 Hypertensive chronic kidney disease with stage 1 through stage 4 chronic kidney disease, or unspecified chronic kidney disease: Secondary | ICD-10-CM | POA: Diagnosis not present

## 2018-04-07 DIAGNOSIS — R801 Persistent proteinuria, unspecified: Secondary | ICD-10-CM | POA: Diagnosis not present

## 2018-04-07 LAB — BASIC METABOLIC PANEL
BUN: 22 — AB (ref 4–21)
Creatinine: 1.1 (ref 0.5–1.1)
Glucose: 101
Potassium: 4.3 (ref 3.4–5.3)
SODIUM: 143 (ref 137–147)

## 2018-04-07 LAB — CBC AND DIFFERENTIAL
HCT: 38 (ref 36–46)
Hemoglobin: 12.1 (ref 12.0–16.0)
Platelets: 264 (ref 150–399)
WBC: 7.1

## 2018-04-07 LAB — HEPATIC FUNCTION PANEL
ALT: 25 (ref 7–35)
AST: 23 (ref 13–35)
Alkaline Phosphatase: 86 (ref 25–125)

## 2018-04-07 LAB — IRON,TIBC AND FERRITIN PANEL: Ferritin: 41

## 2018-04-13 ENCOUNTER — Encounter: Payer: Self-pay | Admitting: Family Medicine

## 2018-04-13 DIAGNOSIS — N183 Chronic kidney disease, stage 3 unspecified: Secondary | ICD-10-CM | POA: Insufficient documentation

## 2018-04-19 ENCOUNTER — Other Ambulatory Visit: Payer: Self-pay | Admitting: Obstetrics & Gynecology

## 2018-04-19 DIAGNOSIS — Z1231 Encounter for screening mammogram for malignant neoplasm of breast: Secondary | ICD-10-CM

## 2018-04-20 DIAGNOSIS — H2513 Age-related nuclear cataract, bilateral: Secondary | ICD-10-CM | POA: Diagnosis not present

## 2018-04-20 DIAGNOSIS — E119 Type 2 diabetes mellitus without complications: Secondary | ICD-10-CM | POA: Diagnosis not present

## 2018-04-21 DIAGNOSIS — I129 Hypertensive chronic kidney disease with stage 1 through stage 4 chronic kidney disease, or unspecified chronic kidney disease: Secondary | ICD-10-CM | POA: Diagnosis not present

## 2018-04-26 ENCOUNTER — Ambulatory Visit (INDEPENDENT_AMBULATORY_CARE_PROVIDER_SITE_OTHER): Payer: 59 | Admitting: *Deleted

## 2018-04-26 DIAGNOSIS — J4550 Severe persistent asthma, uncomplicated: Secondary | ICD-10-CM

## 2018-05-04 DIAGNOSIS — G4733 Obstructive sleep apnea (adult) (pediatric): Secondary | ICD-10-CM | POA: Diagnosis not present

## 2018-05-15 NOTE — Progress Notes (Addendum)
[facility] [place], [place], [place] [address] [phone]  Date:  05/18/2018   Name:  Ashley Villarreal   DOB:  04-27-1956   MRN:  [id number]  PCP:  Darreld Mclean, MD    Chief Complaint: Diabetes (6 month follow up) and Flu Vaccine (has to have ear surgery in one week, ok to do flu shot?)   History of Present Illness:  Ashley Villarreal is a 62 y.o. very pleasant female patient who presents with the following:  Periodic follow-up visit today History of CKD followed by nephrology, hyperlipidemia, HTN, DM, asthma Last seen here in May of this year: Well controlled DM on metformin 500 BID On losartan for her HTN Simvastatin for her lipids She is undergoing immunotherapy per allergy/ asthma.  She notes that her sx are under ok control right now However she feels like her ears are stopped up a lot of the time and was not sure if she has wax in her ears. Her ears are itchy but not painful. She notes that her hearing seems to be getting worse, in the am will be not as bad, hearing gets worse as the day goes on. She has been getting concerned about this and would like to see ENT.  She feels like she might need to get hearing aids soon  She will exercise indoors or late/ early to avoid the heat  She also sees pulmonology who manages her CPAP/ OSA  Her father had just died at our last visit; she is handling this loss ok  Flu shot:  She will do once she is done with prednisone  Eye exam: done last month  Full labs done in May- BMP, A1c today   qvar flonase advair xopenex Metformin- 500 mg. She is taking just one a day  singulair zegerid protonix zocor Amlodpine/valsartan: recently changed from losartan to this med by her nephrologist   [facility]- [facility] She has been seen by gyn onc at [facility] for a benign uterine leiomyomata - all ok Nephrology- seen last month, Colodonato  She reports that she had a  middle ear effusion and will have tubes placed. We hope that this will help with her hearing  They did put her on a steroid taper a week ago- she is tapering off of this slowly Her glucose is running high since she has been on the pred. Got up to about 200 while on 40mg  but now she is tapering down  She does notice that her hearing is better while she is taking prednisone   Reviewed recent note by [facility] and will abstract labs from this note- recent CMP and CBC   She is walking about 5 miles a day, no CP or SOB She does have some callus under her feet but no pain  No foot numbness   Lab Results  Component Value Date   HGBA1C 6.5 11/17/2017    Patient Active Problem List   Diagnosis Date Noted  . CKD (chronic kidney disease) stage 3, GFR 30-59 ml/min (HCC) 04/13/2018  . Gastroesophageal reflux disease without esophagitis 06/09/2017  . Anosmia 06/09/2017  . Asthma, severe persistent, well-controlled 06/12/2015  . LPRD (laryngopharyngeal reflux disease) 06/12/2015  . Allergic rhinoconjunctivitis 06/12/2015  . Allergy with anaphylaxis due to food 06/12/2015  . Elevated serum creatinine 05/01/2015  . Sleep apnea, obstructive   . Hyperlipemia 08/10/2011  . Hypertensive heart disease   . Diabetes mellitus  type 2, noninsulin dependent [facility])     Past Medical History:  Diagnosis Date  . Allergy   . Asthma   . COPD (chronic obstructive pulmonary disease) ([place])   . Diabetes mellitus without complication ([place])   . Hypertension   . Sleep apnea    uses CPAP    Past Surgical History:  Procedure Laterality Date  . COLONOSCOPY    . DILATION AND CURETTAGE OF UTERUS    . NASAL SINUS SURGERY    . TONSILLECTOMY  1975    Social History   Tobacco Use  . Smoking status: Never Smoker  . Smokeless tobacco: Never Used  Substance Use Topics  . Alcohol use: No    Alcohol/week: 0.0 standard drinks  . Drug use: No    Family History  Problem Relation Age of Onset  . Stroke Father    . Heart disease Father   . Diabetes Mother   . Heart disease Mother   . Stroke Mother   . Hyperlipidemia Sister   . Hyperlipidemia Brother   . Diabetes Paternal Grandmother   . Hypertension Brother   . Colon cancer Neg Hx     Allergies  Allergen Reactions  . Claritin [Loratadine]     dizziness  . Doxycycline Nausea Only  . Other Other (See Comments)    Magic mouthwash  Patient get blisters on her lips    Medication list has been reviewed and updated.  Current Outpatient Medications on File Prior to Visit  Medication Sig Dispense Refill  . adapalene (DIFFERIN) 0.1 % gel     . amLODipine-valsartan (EXFORGE) 5-320 MG tablet     . beclomethasone (QVAR) 80 MCG/ACT inhaler Inhale 2 puffs into the lungs 2 (two) times daily. 3 Inhaler 3  . budesonide (PULMICORT) 0.5 MG/2ML nebulizer solution Rinse sinuses with half of Sinus Rinse bottle.  Then, mix one respule in bottle and rinse sinuses with rest.    . budesonide-formoterol (SYMBICORT) 160-4.5 MCG/ACT inhaler Inhale into the lungs.    Marland Kitchen EPINEPHrine (EPIPEN 2-PAK) 0.3 mg/0.3 mL IJ SOAJ injection Inject 0.3 mLs (0.3 mg total) into the muscle once. 2 Device 1  . fluticasone-salmeterol (ADVAIR HFA) 230-21 MCG/ACT inhaler INHALE TWO PUFFS TWICE DAILY TO PREVENT COUGH OR WHEEZE. RINSE, GARGLE, AND SPIT AFTER USE. 36 Inhaler 3  . HYDROCORTISONE IN ABSORBASE 1 % ointment     . Hydroquinone-Sunscreens (ALPHAQUIN HP) 4 % CREA APPLY TO DARK SPOTS DAILY.  3  . ipratropium-albuterol (DUONEB) 0.5-2.5 (3) MG/3ML SOLN     . levalbuterol (XOPENEX HFA) 45 MCG/ACT inhaler Inhale 2 puffs into the lungs every 6 (six) hours as needed for wheezing. 1 Inhaler 3  . losartan (COZAAR) 100 MG tablet TAKE 1 TABLET (100 MG TOTAL) BY MOUTH DAILY. 90 tablet 3  . metFORMIN (GLUCOPHAGE) 500 MG tablet TAKE 1 TABLET (500 MG TOTAL) BY MOUTH 2 (TWO) TIMES DAILY WITH A MEAL. 180 tablet 3  . montelukast (SINGULAIR) 10 MG tablet TAKE ONE TABLET ONCE DAILY AS DIRECTED. 90  tablet 1  . omalizumab (XOLAIR) 150 MG injection Inject 150 mg into the skin every 28 (twenty-eight) days. 1 each 11  . ONE TOUCH ULTRA TEST test strip PLEASE CHECK BLOOD SUGAR DAILY. DX CODE: E11.9 100 each 6  . pantoprazole (PROTONIX) 40 MG tablet Take 1 tablet (40 mg total) by mouth daily. 30 tablet 5  . predniSONE (DELTASONE) 10 MG tablet     . Scar Treatment Products (BEAU RX) GEL     .  simvastatin (ZOCOR) 20 MG tablet TAKE ONE DAILY FOR CHOLESTEROL 90 tablet 3  . Water For Injection Sterile (STERILE WATER, PRESERVATIVE FREE,) injection Inject 10 mLs as directed as directed. 10 mL 11   Current Facility-Administered Medications on File Prior to Visit  Medication Dose Route Frequency Provider Last Rate Last Dose  . omalizumab Arvid Right) injection 150 mg  150 mg Subcutaneous Q28 days Jiles Prows, MD   150 mg at 04/26/18 1439    Review of Systems:  As per HPI- otherwise negative. No fever or chills  no CP or SOB    Physical Examination: Vitals:   05/18/18 0902  BP: (!) 142/78  Pulse: 81  Resp: 16  SpO2: 98%   Vitals:   05/18/18 0902  Weight: 153 lb 6.4 oz (69.6 kg)  Height: 5\' 8"  (1.727 m)   Body mass index is 23.32 kg/m. Ideal Body Weight: Weight in (lb) to have BMI = 25: 164.1  GEN: WDWN, NAD, Non-toxic, A & O x 3, weight is normal, looks well  HEENT: Atraumatic, Normocephalic. Neck supple. No masses, No LAD.  Bilateral TM wnl, oropharynx normal.  PEERL,EOMI.   Ears and Nose: No external deformity. CV: RRR, No M/G/R. No JVD. No thrill. No extra heart sounds. PULM: CTA B, no wheezes, crackles, rhonchi. No retractions. No resp. distress. No accessory muscle use. ABD: S, NT, ND. No rebound. No HSM. EXTR: No c/c/e NEURO Normal gait.  PSYCH: Normally interactive. Conversant. Not depressed or anxious appearing.  Calm demeanor.  Foot exam done today    Assessment and Plan: Physical exam  Controlled type 2 diabetes mellitus with diabetic nephropathy, without long-term  current use of insulin (HCC) - Plan: Hemoglobin A1c  Essential hypertension  Hyperlipidemia, unspecified hyperlipidemia type  Visit converted to CPE as she had a CPE scheduled with me next week and planned to come back IUTD except for flu-she will do once she is off prednisone BP meds recently adjusted by nephrology Check A1c today Double up on metformin while she is on prednisone  Plan to visit in 6 months assuming all is well   Signed Lamar Blinks, MD  Received her A1c- Results for orders placed or performed in visit on 05/18/18  Hemoglobin A1c  Result Value Ref Range   Hgb A1c MFr Bld 6.8 (H) 4.6 - 6.5 %   Message to pt

## 2018-05-18 ENCOUNTER — Encounter: Payer: Self-pay | Admitting: Family Medicine

## 2018-05-18 ENCOUNTER — Ambulatory Visit: Payer: 59 | Admitting: Family Medicine

## 2018-05-18 VITALS — BP 142/78 | HR 81 | Resp 16 | Ht 68.0 in | Wt 153.4 lb

## 2018-05-18 DIAGNOSIS — I10 Essential (primary) hypertension: Secondary | ICD-10-CM

## 2018-05-18 DIAGNOSIS — Z0000 Encounter for general adult medical examination without abnormal findings: Secondary | ICD-10-CM | POA: Diagnosis not present

## 2018-05-18 DIAGNOSIS — E1121 Type 2 diabetes mellitus with diabetic nephropathy: Secondary | ICD-10-CM | POA: Diagnosis not present

## 2018-05-18 DIAGNOSIS — E785 Hyperlipidemia, unspecified: Secondary | ICD-10-CM

## 2018-05-18 LAB — HEMOGLOBIN A1C: Hgb A1c MFr Bld: 6.8 % — ABNORMAL HIGH (ref 4.6–6.5)

## 2018-05-18 NOTE — Patient Instructions (Addendum)
Great to see you today- I will be in touch with your labs asap Happy holidays!  You can take 2 metformin a day while you are on prednisone Your feet have developed some callus for protection - be sure to wear good padded insoles as your feet do not have a lot of natural padding in the soles.   Assuming your A1c is ok let's plan to visit in 6 months Please get a flu shot once you finish the prednisone   Health Maintenance, Female Adopting a healthy lifestyle and getting preventive care can go a long way to promote health and wellness. Talk with your health care provider about what schedule of regular examinations is right for you. This is a good chance for you to check in with your provider about disease prevention and staying healthy. In between checkups, there are plenty of things you can do on your own. Experts have done a lot of research about which lifestyle changes and preventive measures are most likely to keep you healthy. Ask your health care provider for more information. Weight and diet Eat a healthy diet  Be sure to include plenty of vegetables, fruits, low-fat dairy products, and lean protein.  Do not eat a lot of foods high in solid fats, added sugars, or salt.  Get regular exercise. This is one of the most important things you can do for your health. ? Most adults should exercise for at least 150 minutes each week. The exercise should increase your heart rate and make you sweat (moderate-intensity exercise). ? Most adults should also do strengthening exercises at least twice a week. This is in addition to the moderate-intensity exercise.  Maintain a healthy weight  Body mass index (BMI) is a measurement that can be used to identify possible weight problems. It estimates body fat based on height and weight. Your health care provider can help determine your BMI and help you achieve or maintain a healthy weight.  For females 81 years of age and older: ? A BMI below 18.5 is  considered underweight. ? A BMI of 18.5 to 24.9 is normal. ? A BMI of 25 to 29.9 is considered overweight. ? A BMI of 30 and above is considered obese.  Watch levels of cholesterol and blood lipids  You should start having your blood tested for lipids and cholesterol at 61 years of age, then have this test every 5 years.  You may need to have your cholesterol levels checked more often if: ? Your lipid or cholesterol levels are high. ? You are older than 61 years of age. ? You are at high risk for heart disease.  Cancer screening Lung Cancer  Lung cancer screening is recommended for adults 27-2 years old who are at high risk for lung cancer because of a history of smoking.  A yearly low-dose CT scan of the lungs is recommended for people who: ? Currently smoke. ? Have quit within the past 15 years. ? Have at least a 30-pack-year history of smoking. A pack year is smoking an average of one pack of cigarettes a day for 1 year.  Yearly screening should continue until it has been 15 years since you quit.  Yearly screening should stop if you develop a health problem that would prevent you from having lung cancer treatment.  Breast Cancer  Practice breast self-awareness. This means understanding how your breasts normally appear and feel.  It also means doing regular breast self-exams. Let your health care provider know about  any changes, no matter how small.  If you are in your 20s or 30s, you should have a clinical breast exam (CBE) by a health care provider every 1-3 years as part of a regular health exam.  If you are 42 or older, have a CBE every year. Also consider having a breast X-ray (mammogram) every year.  If you have a family history of breast cancer, talk to your health care provider about genetic screening.  If you are at high risk for breast cancer, talk to your health care provider about having an MRI and a mammogram every year.  Breast cancer gene (BRCA) assessment  is recommended for women who have family members with BRCA-related cancers. BRCA-related cancers include: ? Breast. ? Ovarian. ? Tubal. ? Peritoneal cancers.  Results of the assessment will determine the need for genetic counseling and BRCA1 and BRCA2 testing.  Cervical Cancer Your health care provider may recommend that you be screened regularly for cancer of the pelvic organs (ovaries, uterus, and vagina). This screening involves a pelvic examination, including checking for microscopic changes to the surface of your cervix (Pap test). You may be encouraged to have this screening done every 3 years, beginning at age 24.  For women ages 88-65, health care providers may recommend pelvic exams and Pap testing every 3 years, or they may recommend the Pap and pelvic exam, combined with testing for human papilloma virus (HPV), every 5 years. Some types of HPV increase your risk of cervical cancer. Testing for HPV may also be done on women of any age with unclear Pap test results.  Other health care providers may not recommend any screening for nonpregnant women who are considered low risk for pelvic cancer and who do not have symptoms. Ask your health care provider if a screening pelvic exam is right for you.  If you have had past treatment for cervical cancer or a condition that could lead to cancer, you need Pap tests and screening for cancer for at least 20 years after your treatment. If Pap tests have been discontinued, your risk factors (such as having a new sexual partner) need to be reassessed to determine if screening should resume. Some women have medical problems that increase the chance of getting cervical cancer. In these cases, your health care provider may recommend more frequent screening and Pap tests.  Colorectal Cancer  This type of cancer can be detected and often prevented.  Routine colorectal cancer screening usually begins at 61 years of age and continues through 61 years of  age.  Your health care provider may recommend screening at an earlier age if you have risk factors for colon cancer.  Your health care provider may also recommend using home test kits to check for hidden blood in the stool.  A small camera at the end of a tube can be used to examine your colon directly (sigmoidoscopy or colonoscopy). This is done to check for the earliest forms of colorectal cancer.  Routine screening usually begins at age 41.  Direct examination of the colon should be repeated every 5-10 years through 60 years of age. However, you may need to be screened more often if early forms of precancerous polyps or small growths are found.  Skin Cancer  Check your skin from head to toe regularly.  Tell your health care provider about any new moles or changes in moles, especially if there is a change in a mole's shape or color.  Also tell your health care provider  if you have a mole that is larger than the size of a pencil eraser.  Always use sunscreen. Apply sunscreen liberally and repeatedly throughout the day.  Protect yourself by wearing long sleeves, pants, a wide-brimmed hat, and sunglasses whenever you are outside.  Heart disease, diabetes, and high blood pressure  High blood pressure causes heart disease and increases the risk of stroke. High blood pressure is more likely to develop in: ? People who have blood pressure in the high end of the normal range (130-139/85-89 mm Hg). ? People who are overweight or obese. ? People who are African American.  If you are 51-70 years of age, have your blood pressure checked every 3-5 years. If you are 32 years of age or older, have your blood pressure checked every year. You should have your blood pressure measured twice-once when you are at a hospital or clinic, and once when you are not at a hospital or clinic. Record the average of the two measurements. To check your blood pressure when you are not at a hospital or clinic, you  can use: ? An automated blood pressure machine at a pharmacy. ? A home blood pressure monitor.  If you are between 52 years and 73 years old, ask your health care provider if you should take aspirin to prevent strokes.  Have regular diabetes screenings. This involves taking a blood sample to check your fasting blood sugar level. ? If you are at a normal weight and have a low risk for diabetes, have this test once every three years after 61 years of age. ? If you are overweight and have a high risk for diabetes, consider being tested at a younger age or more often. Preventing infection Hepatitis B  If you have a higher risk for hepatitis B, you should be screened for this virus. You are considered at high risk for hepatitis B if: ? You were born in a country where hepatitis B is common. Ask your health care provider which countries are considered high risk. ? Your parents were born in a high-risk country, and you have not been immunized against hepatitis B (hepatitis B vaccine). ? You have HIV or AIDS. ? You use needles to inject street drugs. ? You live with someone who has hepatitis B. ? You have had sex with someone who has hepatitis B. ? You get hemodialysis treatment. ? You take certain medicines for conditions, including cancer, organ transplantation, and autoimmune conditions.  Hepatitis C  Blood testing is recommended for: ? Everyone born from [birth year] through 1964. ? Anyone with known risk factors for hepatitis C.  Sexually transmitted infections (STIs)  You should be screened for sexually transmitted infections (STIs) including gonorrhea and chlamydia if: ? You are sexually active and are younger than 60 years of age. ? You are older than 61 years of age and your health care provider tells you that you are at risk for this type of infection. ? Your sexual activity has changed since you were last screened and you are at an increased risk for chlamydia or gonorrhea. Ask your  health care provider if you are at risk.  If you do not have HIV, but are at risk, it may be recommended that you take a prescription medicine daily to prevent HIV infection. This is called pre-exposure prophylaxis (PrEP). You are considered at risk if: ? You are sexually active and do not regularly use condoms or know the HIV status of your partner(s). ? You take drugs  by injection. ? You are sexually active with a partner who has HIV.  Talk with your health care provider about whether you are at high risk of being infected with HIV. If you choose to begin PrEP, you should first be tested for HIV. You should then be tested every 3 months for as long as you are taking PrEP. Pregnancy  If you are premenopausal and you may become pregnant, ask your health care provider about preconception counseling.  If you may become pregnant, take 400 to 800 micrograms (mcg) of folic acid every day.  If you want to prevent pregnancy, talk to your health care provider about birth control (contraception). Osteoporosis and menopause  Osteoporosis is a disease in which the bones lose minerals and strength with aging. This can result in serious bone fractures. Your risk for osteoporosis can be identified using a bone density scan.  If you are 67 years of age or older, or if you are at risk for osteoporosis and fractures, ask your health care provider if you should be screened.  Ask your health care provider whether you should take a calcium or vitamin D supplement to lower your risk for osteoporosis.  Menopause may have certain physical symptoms and risks.  Hormone replacement therapy may reduce some of these symptoms and risks. Talk to your health care provider about whether hormone replacement therapy is right for you. Follow these instructions at home:  Schedule regular health, dental, and eye exams.  Stay current with your immunizations.  Do not use any tobacco products including cigarettes, chewing  tobacco, or electronic cigarettes.  If you are pregnant, do not drink alcohol.  If you are breastfeeding, limit how much and how often you drink alcohol.  Limit alcohol intake to no more than 1 drink per day for nonpregnant women. One drink equals 12 ounces of beer, 5 ounces of wine, or 1 ounces of hard liquor.  Do not use street drugs.  Do not share needles.  Ask your health care provider for help if you need support or information about quitting drugs.  Tell your health care provider if you often feel depressed.  Tell your health care provider if you have ever been abused or do not feel safe at home. This information is not intended to replace advice given to you by your health care provider. Make sure you discuss any questions you have with your health care provider. Document Released: 12/22/2010 Document Revised: 11/14/2015 Document Reviewed: 03/12/2015 Elsevier Interactive Patient Education  [employer].

## 2018-05-23 ENCOUNTER — Encounter: Payer: 59 | Admitting: Family Medicine

## 2018-05-23 DIAGNOSIS — H6532 Chronic mucoid otitis media, left ear: Secondary | ICD-10-CM | POA: Diagnosis not present

## 2018-05-24 ENCOUNTER — Other Ambulatory Visit: Payer: Self-pay | Admitting: Family Medicine

## 2018-05-24 ENCOUNTER — Ambulatory Visit: Payer: 59

## 2018-05-24 DIAGNOSIS — E1121 Type 2 diabetes mellitus with diabetic nephropathy: Secondary | ICD-10-CM

## 2018-05-24 DIAGNOSIS — E785 Hyperlipidemia, unspecified: Secondary | ICD-10-CM

## 2018-05-26 ENCOUNTER — Ambulatory Visit (INDEPENDENT_AMBULATORY_CARE_PROVIDER_SITE_OTHER): Payer: 59 | Admitting: *Deleted

## 2018-05-26 DIAGNOSIS — J4550 Severe persistent asthma, uncomplicated: Secondary | ICD-10-CM | POA: Diagnosis not present

## 2018-05-31 ENCOUNTER — Encounter: Payer: Self-pay | Admitting: Allergy and Immunology

## 2018-05-31 ENCOUNTER — Ambulatory Visit: Payer: 59 | Admitting: Allergy and Immunology

## 2018-05-31 VITALS — BP 130/68 | HR 97 | Temp 98.8°F | Resp 16 | Ht 67.5 in | Wt 160.0 lb

## 2018-05-31 DIAGNOSIS — Z23 Encounter for immunization: Secondary | ICD-10-CM | POA: Diagnosis not present

## 2018-05-31 DIAGNOSIS — J4550 Severe persistent asthma, uncomplicated: Secondary | ICD-10-CM | POA: Diagnosis not present

## 2018-05-31 DIAGNOSIS — J339 Nasal polyp, unspecified: Secondary | ICD-10-CM | POA: Diagnosis not present

## 2018-05-31 DIAGNOSIS — G4733 Obstructive sleep apnea (adult) (pediatric): Secondary | ICD-10-CM

## 2018-05-31 DIAGNOSIS — J324 Chronic pansinusitis: Secondary | ICD-10-CM

## 2018-05-31 DIAGNOSIS — K219 Gastro-esophageal reflux disease without esophagitis: Secondary | ICD-10-CM

## 2018-05-31 DIAGNOSIS — J3089 Other allergic rhinitis: Secondary | ICD-10-CM

## 2018-05-31 MED ORDER — FLUTICASONE-SALMETEROL 230-21 MCG/ACT IN AERO: INHALATION_SPRAY | RESPIRATORY_TRACT | 3 refills | Status: DC

## 2018-05-31 MED ORDER — MONTELUKAST SODIUM 10 MG PO TABS: ORAL_TABLET | ORAL | 1 refills | Status: DC

## 2018-05-31 NOTE — Progress Notes (Signed)
Follow-up Note  Referring Provider: Darreld Mclean, MD Primary Provider: Darreld Mclean, MD Date of Office Visit: 05/31/2018  Subjective:   Ashley Villarreal (DOB: [birth year]) is a 62 y.o. female who returns to the [facility] on 05/31/2018 in re-evaluation of the following:  HPI: Erikah presents to this clinic in evaluation of asthma and allergic rhinoconjunctivitis and sleep apnea.  Her last visit to this clinic was 30 November 2017.  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 Redmond Baseman, ENT, which identified pansinusitis and she was treated with systemic steroids and placement of a left ear ventilation tube for ETD.  Prednisone has resulted in dramatic improvement regarding her upper respiratory tract symptoms symptoms and she can now smell.  She finished her last dose of prednisone on 24 May 2018.  She is now using budesonide washes to replace her nasal steroid.  She continues on montelukast which has been a longstanding medication.  Asthma has not been an issue and she has not required a systemic steroid or antibiotic to treat any type of issue with her lower airway and she rarely uses a short acting bronchodilator while she continues on Xolair and Advair and montelukast.  Her sleep apnea is going quite well while consistently using CPAP.  She has had no issues with her reflux on her current therapy.  Allergies as of 05/31/2018      Reactions   Claritin [loratadine]    dizziness   Doxycycline Nausea Only   Other Other (See Comments)   Magic mouthwash  Patient get blisters on her lips      Medication List      adapalene 0.1 % gel Commonly known as:  DIFFERIN   ALPHAQUIN HP 4 % Crea APPLY TO DARK SPOTS DAILY.   amLODipine-valsartan 5-320 MG tablet Commonly known as:  EXFORGE   BEAU RX Gel   beclomethasone 80 MCG/ACT inhaler Commonly known  as:  QVAR Inhale 2 puffs into the lungs 2 (two) times daily.   budesonide 0.5 MG/2ML nebulizer solution Commonly known as:  PULMICORT Rinse sinuses with half of Sinus Rinse bottle.  Then, mix one respule in bottle and rinse sinuses with rest.   EPINEPHrine 0.3 mg/0.3 mL Soaj injection Commonly known as:  EPI-PEN Inject 0.3 mLs (0.3 mg total) into the muscle once.   fluticasone-salmeterol 230-21 MCG/ACT inhaler Commonly known as:  ADVAIR HFA INHALE TWO PUFFS TWICE DAILY TO PREVENT COUGH OR WHEEZE. RINSE, GARGLE, AND SPIT AFTER USE.   HYDROCORTISONE IN ABSORBASE 1 % ointment Generic drug:  hydrocortisone   ipratropium-albuterol 0.5-2.5 (3) MG/3ML Soln Commonly known as:  DUONEB   levalbuterol 45 MCG/ACT inhaler Commonly known as:  XOPENEX HFA Inhale 2 puffs into the lungs every 6 (six) hours as needed for wheezing.   losartan 100 MG tablet Commonly known as:  COZAAR TAKE 1 TABLET (100 MG TOTAL) BY MOUTH DAILY.   metFORMIN 500 MG tablet Commonly known as:  GLUCOPHAGE TAKE 1 TABLET BY MOUTH TWICE A DAY WITH A MEAL   montelukast 10 MG tablet Commonly known as:  SINGULAIR TAKE ONE TABLET ONCE DAILY AS DIRECTED.   omalizumab 150 MG injection Commonly known as:  XOLAIR Inject 150 mg into the skin every 28 (twenty-eight) days.   ONE TOUCH ULTRA TEST test strip Generic drug:  glucose blood PLEASE CHECK BLOOD SUGAR  DAILY. DX CODE: E11.9   pantoprazole 40 MG tablet Commonly known as:  PROTONIX Take 1 tablet (40 mg total) by mouth daily.   predniSONE 10 MG tablet Commonly known as:  DELTASONE   simvastatin 20 MG tablet Commonly known as:  ZOCOR TAKE ONE TABLET BY MOUTH DAILY FOR CHOLESTEROL   sterile water (preservative free) injection Inject 10 mLs as directed as directed.       Past Medical History:  Diagnosis Date  . Allergy   . Asthma   . COPD (chronic obstructive pulmonary disease) ([place])   . Diabetes mellitus without complication ([place])   . Hypertension   .  Sleep apnea    uses CPAP    Past Surgical History:  Procedure Laterality Date  . COLONOSCOPY    . DILATION AND CURETTAGE OF UTERUS    . NASAL SINUS SURGERY    . TONSILLECTOMY  1975  . TYMPANOSTOMY TUBE PLACEMENT      Review of systems negative except as noted in HPI / PMHx or noted below:  Review of Systems  Constitutional: Negative.   HENT: Negative.   Eyes: Negative.   Respiratory: Negative.   Cardiovascular: Negative.   Gastrointestinal: Negative.   Genitourinary: Negative.   Musculoskeletal: Negative.   Skin: Negative.   Neurological: Negative.   Endo/Heme/Allergies: Negative.   Psychiatric/Behavioral: Negative.      Objective:   Vitals:   05/31/18 1107  BP: 130/68  Pulse: 97  Resp: 16  Temp: 98.8 F (37.1 C)  SpO2: 98%   Height: 5' 7.5" (171.5 cm)  Weight: 160 lb (72.6 kg)   Physical Exam  HENT:  Head: Normocephalic.  Right Ear: Tympanic membrane, external ear and ear canal normal.  Left Ear: External ear normal. A foreign body (tube) is present.  Nose: Nose normal. No mucosal edema or rhinorrhea.  Mouth/Throat: Uvula is midline, oropharynx is clear and moist and mucous membranes are normal. No oropharyngeal exudate.  Eyes: Conjunctivae are normal.  Neck: Trachea normal. No tracheal tenderness present. No tracheal deviation present. No thyromegaly present.  Cardiovascular: Normal rate, regular rhythm, S1 normal, S2 normal and normal heart sounds.  No murmur heard. Pulmonary/Chest: Breath sounds normal. No stridor. No respiratory distress. She has no wheezes. She has no rales.  Musculoskeletal: She exhibits no edema.  Lymphadenopathy:       Head (right side): No tonsillar adenopathy present.       Head (left side): No tonsillar adenopathy present.    She has no cervical adenopathy.  Neurological: She is alert.  Skin: No rash noted. She is not diaphoretic. No erythema. Nails show no clubbing.    Diagnostics:    Spirometry was performed and  demonstrated an FEV1 of 1.69 at 69 % of predicted.  The patient had an Asthma Control Test with the following results: ACT Total Score: 25.    Results of a sinus CT scan obtained 15 December 2017 identify the following:  Frontal: Complete opacification.  Ethmoid: Complete opacification. Probable bone destruction with coalescence of the ethmoid air spaces. Some of this appearance may relate to previous functional endoscopic sinus surgery.  Maxillary: Mucosal thickening of the right maxillary sinus with a small air-fluid level. Mucosal thickening of the left maxillary sinus with more layering fluid.  Sphenoid: Complete opacification of the left division. Mucosal thickening of the right division with mucoid fluid.  Right ostiomeatal unit: Previous functional endoscopic sinus surgery. Unable to evaluate because of the surgical changes and mucosal thickening.  Left ostiomeatal unit:  Previous functional endoscopic sinus surgery. Unable to evaluate because of the surgical changes and mucosal thickening.  Nasal passages: Opacification of the upper nasal passages, particularly on the left. Polyp disease not ruled out.  Anatomy: Frontal sinuses extend superior to the anterior ethmoid notches. Symmetric and intact olfactory grooves and fovea ethmoidalis, Keros I (1-46mm). Presellar sphenoid pneumatization pattern. No dehiscence of carotid or optic canals. No onodi cell.  Assessment and Plan:   1. Asthma, severe persistent, well-controlled   2. Other allergic rhinitis   3. Chronic pansinusitis   4. Nasal polyposis   5. LPRD (laryngopharyngeal reflux disease)   6. Sleep apnea, obstructive   7. Need for immunization against influenza     1. Continue Advair 230 two inhalations 1-2 times per day depending on disease activity  2. Add Qvar 80 two inhalations twice a day as part of "Action Plan" during increased asthma activity.  3. Continue pantoprazole 40 mg daily  4. Continue  budesonide washes as directed by Dr. Redmond Baseman  5. Continue montelukast 10 mg tablet 1 time per day  6.  Continue Xolair and EpiPen for now. Submit for approval of either Mepolizumab or Dupilumab for asthma and nasal polyps and eosinophilia to replace Xolair  7.  Continue Xopenex HFA if needed  8. Return to clinic in 6 months or earlier if problem  9. Flu vaccine administered in clinic today  Mylah has developed very significant upper airway disease which has been a longstanding issue requiring functional endoscopic sinus surgery in the past and we will now see if she is a candidate for a anti-IL-5 or anti-IL 4 biological agent to replace her omalizumab as these other agents do appear to have a more significant effect upon upper airway inflammation and nasal polyps then Xolair.  Her asthma is actually under excellent control as is her reflux on her current plan and I see very little need for changing this plan at this point.  Hopefully we can get approval for a new biological agent sometime within the next few weeks.  I will see her back in this clinic in 6 months or earlier if there is a problem.  Allena Katz, MD Allergy / Immunology [place]

## 2018-05-31 NOTE — Patient Instructions (Addendum)
  1. Continue Advair 230 two inhalations 1-2 times per day depending on disease activity  2. Add Qvar 80 two inhalations twice a day as part of "Action Plan" during increased asthma activity.  3. Continue pantoprazole 40 mg daily  4. Continue budesonide washes as directed by Dr. Redmond Baseman  5. Continue montelukast 10 mg tablet 1 time per day  6.  Continue Xolair and EpiPen for now. Submit for approval of either Mepolizumab or Dupilumab for asthma and nasal polyps and eosinophilia to replace Xolair  7.  Continue Xopenex HFA if needed  8. Return to clinic in 6 months or earlier if problem  9. Flu vaccine administered in clinic today

## 2018-06-01 ENCOUNTER — Ambulatory Visit
Admission: RE | Admit: 2018-06-01 | Discharge: 2018-06-01 | Disposition: A | Discharge status: Home / Self Care | Payer: 59 | Source: Ambulatory Visit | Attending: Obstetrics & Gynecology | Admitting: Obstetrics & Gynecology

## 2018-06-01 ENCOUNTER — Encounter: Payer: Self-pay | Admitting: Allergy and Immunology

## 2018-06-01 DIAGNOSIS — Z1231 Encounter for screening mammogram for malignant neoplasm of breast: Secondary | ICD-10-CM | POA: Diagnosis not present

## 2018-06-03 ENCOUNTER — Other Ambulatory Visit: Payer: Self-pay | Admitting: Family Medicine

## 2018-06-03 DIAGNOSIS — G4733 Obstructive sleep apnea (adult) (pediatric): Secondary | ICD-10-CM | POA: Diagnosis not present

## 2018-06-06 ENCOUNTER — Telehealth: Payer: Self-pay | Admitting: *Deleted

## 2018-06-06 ENCOUNTER — Telehealth: Payer: Self-pay

## 2018-06-06 NOTE — Telephone Encounter (Signed)
Per Dr Neldon Mc patient having asthma issues along with nasal polyps and wants her to change from Xolair to [place]oo to treat her multiple conditions.  I reached out to patient to advise that I have same approved and $0 out of pocket copay for new Rx.  She advised that she has ENT appt in Jan 2020 and wants to wait until her ENT followup to start therapy.  She was advised to reach back out to me after appt to advise start of new therapy

## 2018-06-06 NOTE — Telephone Encounter (Signed)
One Touch supplies not covered by insurance, unsure of preferred.

## 2018-06-07 ENCOUNTER — Encounter: Payer: Self-pay | Admitting: Family Medicine

## 2018-06-07 NOTE — Telephone Encounter (Signed)
Ok, message sent to pt

## 2018-06-10 ENCOUNTER — Encounter: Payer: Self-pay | Admitting: Family Medicine

## 2018-06-10 LAB — CHLORIDE: Chloride: 101

## 2018-06-10 LAB — CO2, TOTAL: Carbon Dioxide, Total: 27

## 2018-06-10 LAB — CALCIUM: Calcium: 10.1

## 2018-06-10 LAB — ALBUMIN: ALBUMIN: 4.5

## 2018-06-17 ENCOUNTER — Telehealth: Payer: Self-pay | Admitting: Family Medicine

## 2018-06-17 NOTE — Telephone Encounter (Signed)
Copied from [place] [id number]. Topic: Quick Communication - Rx Refill/Question >> Jun 17, 2018  3:42 PM Jackey Loge, Lenna Sciara wrote: Medication: Patient needs a prescription called in for Accucheck test strips, her Insurance will cover these, they will not cover the One Touch test strips. She also needs a prescription for the meter.  Has the patient contacted their pharmacy? No. (Agent: If no, request that the patient contact the pharmacy for the refill.) (Agent: If yes, when and what did the pharmacy advise?)  Preferred Pharmacy (with phone number or street name): [id number] - [place], [place] [phone] (Phone) [phone] (Fax)    Agent: Please be advised that RX refills may take up to 3 business days. We ask that you follow-up with your pharmacy.

## 2018-06-21 MED ORDER — BLOOD GLUCOSE METER KIT: PACK | 0 refills | Status: AC

## 2018-06-21 NOTE — Addendum Note (Signed)
Addended by: Wynonia Musty A on: 06/21/2018 09:26 AM   Modules accepted: Orders

## 2018-06-21 NOTE — Telephone Encounter (Signed)
New meter and supplies have been sent to patient's pharmacy.

## 2018-06-27 ENCOUNTER — Ambulatory Visit: Payer: Self-pay | Admitting: *Deleted

## 2018-06-27 ENCOUNTER — Ambulatory Visit (INDEPENDENT_AMBULATORY_CARE_PROVIDER_SITE_OTHER): Payer: 59 | Admitting: *Deleted

## 2018-06-27 DIAGNOSIS — J4550 Severe persistent asthma, uncomplicated: Secondary | ICD-10-CM

## 2018-06-27 DIAGNOSIS — Z9622 Myringotomy tube(s) status: Secondary | ICD-10-CM | POA: Insufficient documentation

## 2018-06-27 DIAGNOSIS — J339 Nasal polyp, unspecified: Secondary | ICD-10-CM | POA: Diagnosis not present

## 2018-06-27 DIAGNOSIS — J324 Chronic pansinusitis: Secondary | ICD-10-CM | POA: Diagnosis not present

## 2018-06-27 DIAGNOSIS — H6532 Chronic mucoid otitis media, left ear: Secondary | ICD-10-CM | POA: Diagnosis not present

## 2018-07-14 ENCOUNTER — Telehealth: Payer: Self-pay | Admitting: *Deleted

## 2018-07-14 NOTE — Telephone Encounter (Signed)
Had spoken to patient back on 12/16 in regards to starting Dupixent to replace Xolair for her asthma and nasal polyps per Dr Neldon Mc suggestion.  She advised she would thin about it let me know after ENT visit in Jan 2020.  I called patient back on 1/22 and inquired if she wants to move forward in starting [place] and she advised she would discuss with husband and let me know next day.  She did call me back today and wants to move forward but she does want to go ahead and get her next Xolair dose due next week before starting new therapy and I advised her that would be fine so I will submit after she last dose of xolair is delivered

## 2018-07-21 ENCOUNTER — Telehealth: Payer: Self-pay | Admitting: Internal Medicine

## 2018-07-21 NOTE — Telephone Encounter (Signed)
Spoke with pt, she would like to know what CY thinks about the [place]. She states she doesn't want to spend the money if he doesn't think it is worth it. CY please advise.

## 2018-07-21 NOTE — Telephone Encounter (Signed)
Think of it as a labor saving device, like a dishwasher. I've never heard a patient say they regret having one, but it certainly isn't necessary.

## 2018-07-21 NOTE — Telephone Encounter (Signed)
Called and spoke with patient. Dr Janee Morn recommendations given. Understanding stated.  Nothing further at this time.

## 2018-07-25 ENCOUNTER — Ambulatory Visit (INDEPENDENT_AMBULATORY_CARE_PROVIDER_SITE_OTHER): Payer: 59 | Admitting: *Deleted

## 2018-07-25 DIAGNOSIS — J4550 Severe persistent asthma, uncomplicated: Secondary | ICD-10-CM

## 2018-07-26 ENCOUNTER — Other Ambulatory Visit: Payer: Self-pay | Admitting: Allergy & Immunology

## 2018-08-22 ENCOUNTER — Ambulatory Visit (INDEPENDENT_AMBULATORY_CARE_PROVIDER_SITE_OTHER): Payer: 59

## 2018-08-22 DIAGNOSIS — J4540 Moderate persistent asthma, uncomplicated: Secondary | ICD-10-CM | POA: Diagnosis not present

## 2018-08-22 NOTE — Progress Notes (Signed)
Immunotherapy   Patient Details  Name: Ashley Villarreal MRN: [id number] Date of Birth: 1955/12/03  08/22/2018  Willia Craze Ellery Started [place] and received a loading dose of 600mg . Patient waited 30 minutes with no problem. Frequency: Every 2 weeks Epi-Pen: Yes Consent signed and patient instructions given.   Herbie Drape 08/22/2018, 12:16 PM

## 2018-08-26 DIAGNOSIS — Z9622 Myringotomy tube(s) status: Secondary | ICD-10-CM | POA: Diagnosis not present

## 2018-08-26 DIAGNOSIS — J339 Nasal polyp, unspecified: Secondary | ICD-10-CM | POA: Diagnosis not present

## 2018-08-26 DIAGNOSIS — J324 Chronic pansinusitis: Secondary | ICD-10-CM | POA: Diagnosis not present

## 2018-09-05 ENCOUNTER — Ambulatory Visit: Payer: Self-pay

## 2018-09-06 ENCOUNTER — Ambulatory Visit (INDEPENDENT_AMBULATORY_CARE_PROVIDER_SITE_OTHER): Payer: 59 | Admitting: *Deleted

## 2018-09-06 DIAGNOSIS — J4540 Moderate persistent asthma, uncomplicated: Secondary | ICD-10-CM

## 2018-09-06 DIAGNOSIS — G4733 Obstructive sleep apnea (adult) (pediatric): Secondary | ICD-10-CM | POA: Diagnosis not present

## 2018-09-20 ENCOUNTER — Ambulatory Visit (INDEPENDENT_AMBULATORY_CARE_PROVIDER_SITE_OTHER): Payer: 59 | Admitting: *Deleted

## 2018-09-20 DIAGNOSIS — J4550 Severe persistent asthma, uncomplicated: Secondary | ICD-10-CM | POA: Diagnosis not present

## 2018-10-04 ENCOUNTER — Ambulatory Visit: Payer: 59

## 2018-10-06 ENCOUNTER — Other Ambulatory Visit: Payer: Self-pay

## 2018-10-06 ENCOUNTER — Ambulatory Visit (INDEPENDENT_AMBULATORY_CARE_PROVIDER_SITE_OTHER): Payer: 59 | Admitting: *Deleted

## 2018-10-06 DIAGNOSIS — J4540 Moderate persistent asthma, uncomplicated: Secondary | ICD-10-CM | POA: Diagnosis not present

## 2018-10-18 ENCOUNTER — Other Ambulatory Visit: Payer: Self-pay

## 2018-10-18 ENCOUNTER — Ambulatory Visit (INDEPENDENT_AMBULATORY_CARE_PROVIDER_SITE_OTHER): Payer: 59 | Admitting: *Deleted

## 2018-10-18 DIAGNOSIS — J4550 Severe persistent asthma, uncomplicated: Secondary | ICD-10-CM | POA: Diagnosis not present

## 2018-10-18 DIAGNOSIS — L818 Other specified disorders of pigmentation: Secondary | ICD-10-CM | POA: Diagnosis not present

## 2018-10-20 ENCOUNTER — Ambulatory Visit: Payer: Self-pay

## 2018-11-01 ENCOUNTER — Ambulatory Visit (INDEPENDENT_AMBULATORY_CARE_PROVIDER_SITE_OTHER): Payer: 59

## 2018-11-01 DIAGNOSIS — J4550 Severe persistent asthma, uncomplicated: Secondary | ICD-10-CM | POA: Diagnosis not present

## 2018-11-01 DIAGNOSIS — G4733 Obstructive sleep apnea (adult) (pediatric): Secondary | ICD-10-CM | POA: Diagnosis not present

## 2018-11-14 NOTE — Progress Notes (Signed)
[place] at [facility] [address], [place], [place] [address] [phone]  Date:  11/16/2018   Name:  Ashley Villarreal   DOB:  10/29/1955   MRN:  [id number]  PCP:  Darreld Mclean, MD    Chief Complaint: No chief complaint on file.   History of Present Illness:  Ashley Villarreal is a 63 y.o. very pleasant female patient who presents with the following:  Virtual follow-up visit today for this patient with history of DM, HTN, hyperlipidemia and CKD, OSA Patient location is home Provider location is office Patient identity confirmed with name and date of birth, she gives consent for virtual visit today  She also suffers from allergies and is undergoing immunotherapy per allergy and asthma center They now have her on a new treatment which is given in the office twice a month Last seen by myself in November for a physical   Her nephrologist is Dr. Vanita Panda - appears most recent visit was in October  Next visit with nephrology is next month- will be virtual.  We will get kidney labs for her, so she will have these numbers Due for A1c today, lipid, full labs   Metformin 500 once daily Zocor Qvar Symbicort Amlodipine/ valsartan 5/320 one day She generally enjoys walking for exercise- she is doing 6 miles a day right now due to having extra time   She is retired from her work, she is staying at home most of the time right now due to pandemic She is checking her glucose daily- she notes that her glucose is "looking great"  She has no other concerns today, generally feeling well  Patient Active Problem List   Diagnosis Date Noted  . CKD (chronic kidney disease) stage 3, GFR 30-59 ml/min (HCC) 04/13/2018  . Gastroesophageal reflux disease without esophagitis 06/09/2017  . Anosmia 06/09/2017  . Asthma, severe persistent, well-controlled 06/12/2015  . LPRD (laryngopharyngeal reflux disease) 06/12/2015  . Allergic  rhinoconjunctivitis 06/12/2015  . Allergy with anaphylaxis due to food 06/12/2015  . Elevated serum creatinine 05/01/2015  . Sleep apnea, obstructive   . Hyperlipemia 08/10/2011  . Hypertensive heart disease   . Diabetes mellitus type 2, noninsulin dependent ([place])     Past Medical History:  Diagnosis Date  . Allergy   . Asthma   . COPD (chronic obstructive pulmonary disease) ([place])   . Diabetes mellitus without complication ([place])   . Hypertension   . Sleep apnea    uses CPAP    Past Surgical History:  Procedure Laterality Date  . COLONOSCOPY    . DILATION AND CURETTAGE OF UTERUS    . NASAL SINUS SURGERY    . TONSILLECTOMY  1975  . TYMPANOSTOMY TUBE PLACEMENT      Social History   Tobacco Use  . Smoking status: Never Smoker  . Smokeless tobacco: Never Used  Substance Use Topics  . Alcohol use: No    Alcohol/week: 0.0 standard drinks  . Drug use: No    Family History  Problem Relation Age of Onset  . Stroke Father   . Heart disease Father   . Diabetes Mother   . Heart disease Mother   . Stroke Mother   . Hyperlipidemia Sister   . Hyperlipidemia Brother   . Diabetes Paternal Grandmother   . Hypertension Brother   . Colon cancer Neg Hx     Allergies  Allergen Reactions  . Claritin [Loratadine]  dizziness  . Doxycycline Nausea Only  . Other Other (See Comments)    Magic mouthwash  Patient get blisters on her lips    Medication list has been reviewed and updated.  Current Outpatient Medications on File Prior to Visit  Medication Sig Dispense Refill  . adapalene (DIFFERIN) 0.1 % gel     . amLODipine-valsartan (EXFORGE) 5-320 MG tablet     . beclomethasone (QVAR) 80 MCG/ACT inhaler Inhale 2 puffs into the lungs 2 (two) times daily. 3 Inhaler 3  . blood glucose meter kit and supplies Accucheck test strips and meter. Use up to four times daily as directed. (FOR ICD-10 E10.9, E11.9). 1 each 0  . budesonide (PULMICORT) 0.5 MG/2ML nebulizer solution Rinse  sinuses with half of Sinus Rinse bottle.  Then, mix one respule in bottle and rinse sinuses with rest.    . budesonide-formoterol (SYMBICORT) 160-4.5 MCG/ACT inhaler Inhale into the lungs.    Marland Kitchen EPINEPHrine (EPIPEN 2-PAK) 0.3 mg/0.3 mL IJ SOAJ injection Inject 0.3 mLs (0.3 mg total) into the muscle once. 2 Device 1  . fluticasone-salmeterol (ADVAIR HFA) 230-21 MCG/ACT inhaler INHALE TWO PUFFS TWICE DAILY TO PREVENT COUGH OR WHEEZE. RINSE, GARGLE, AND SPIT AFTER USE. 36 Inhaler 3  . HYDROCORTISONE IN ABSORBASE 1 % ointment     . Hydroquinone-Sunscreens (ALPHAQUIN HP) 4 % CREA APPLY TO DARK SPOTS DAILY.  3  . ipratropium-albuterol (DUONEB) 0.5-2.5 (3) MG/3ML SOLN     . levalbuterol (XOPENEX HFA) 45 MCG/ACT inhaler Inhale 2 puffs into the lungs every 6 (six) hours as needed for wheezing. 1 Inhaler 3  . losartan (COZAAR) 100 MG tablet TAKE 1 TABLET (100 MG TOTAL) BY MOUTH DAILY. (Patient not taking: Reported on 05/31/2018) 90 tablet 3  . metFORMIN (GLUCOPHAGE) 500 MG tablet TAKE 1 TABLET BY MOUTH TWICE A DAY WITH A MEAL 180 tablet 3  . montelukast (SINGULAIR) 10 MG tablet TAKE ONE TABLET ONCE DAILY AS DIRECTED. 90 tablet 1  . omalizumab (XOLAIR) 150 MG injection Inject 150 mg into the skin every 28 (twenty-eight) days. 1 each 11  . ONE TOUCH ULTRA TEST test strip PLEASE CHECK BLOOD SUGAR DAILY. DX CODE: E11.9 300 each 6  . pantoprazole (PROTONIX) 40 MG tablet TAKE 1 TABLET BY MOUTH EVERY DAY 90 tablet 1  . predniSONE (DELTASONE) 10 MG tablet     . Scar Treatment Products (BEAU RX) GEL     . simvastatin (ZOCOR) 20 MG tablet TAKE ONE TABLET BY MOUTH DAILY FOR CHOLESTEROL 90 tablet 3  . Water For Injection Sterile (STERILE WATER, PRESERVATIVE FREE,) injection Inject 10 mLs as directed as directed. 10 mL 11   Current Facility-Administered Medications on File Prior to Visit  Medication Dose Route Frequency Provider Last Rate Last Dose  . omalizumab Arvid Right) injection 150 mg  150 mg Subcutaneous Q28 days  Jiles Prows, MD   150 mg at 07/25/18 1118    Review of Systems:  As per HPI- otherwise negative. She is well- no cough or fever  Physical Examination: There were no vitals filed for this visit. There were no vitals filed for this visit. There is no height or weight on file to calculate BMI. Ideal Body Weight:    Pt observed over video today-looks well, no cough, wheezing, distress is noted She is checking her BP daily- readings from the last week 120/69, 82 129/74, 79 132/71, 79 121/71, 81 121/71, 80  Her glucose over the last week- fasting am 100, 104, 105, 101, 97  She is getting a weight once a week- generally stable about 153- 155  Assessment and Plan: Controlled type 2 diabetes mellitus with diabetic nephropathy, without long-term current use of insulin ([place]) - Plan: Comprehensive metabolic panel, Hemoglobin A1c  Essential hypertension - Plan: CBC, Comprehensive metabolic panel  Hyperlipidemia, unspecified hyperlipidemia type - Plan: Lipid panel  Asthma, severe persistent, well-controlled  CKD (chronic kidney disease) stage 3, GFR 30-59 ml/min (HCC)  Virtual follow-up visit today.  Ashley Villarreal is doing well, she reports excellent blood pressure and blood sugar control at home.  We will bring her in for routine labs to monitor her progress. She is on metformin 500 once a day Cholesterol is treated with Zocor Blood pressure is treated with amlodipine/valsartan Will obtain lab work so she is prepared for her nephrology visit over the next few weeks Ordered future labs for her- my CMA will call and set up her lab draw - Will plan further follow- up pending labs.  Signed Lamar Blinks, MD  Sent the following discharge instructions via my chart It was very nice to talk with you today.  My nurse will call you to set up your lab appointment I will be in touch with your labs I would suggest that you get the shingles vaccine, Shingrix, at your convenience This can be  given at your drugstore, or at our office next time you are in  Assuming your labs are okay, let us plan to visit in the office in about 6 months.  Hopefully we can visit in person

## 2018-11-15 ENCOUNTER — Ambulatory Visit (INDEPENDENT_AMBULATORY_CARE_PROVIDER_SITE_OTHER): Payer: 59 | Admitting: *Deleted

## 2018-11-15 ENCOUNTER — Other Ambulatory Visit: Payer: Self-pay

## 2018-11-15 DIAGNOSIS — J4550 Severe persistent asthma, uncomplicated: Secondary | ICD-10-CM

## 2018-11-15 NOTE — Patient Instructions (Addendum)
It was very nice to talk with you today.  My nurse will call you to set up your lab appointment I will be in touch with your labs I would suggest that you get the shingles vaccine, Shingrix, at your convenience This can be given at your drugstore, or at our office next time you are in  Assuming your labs are okay, let us plan to visit in the office in about 6 months.  Hopefully we can visit in person

## 2018-11-16 ENCOUNTER — Ambulatory Visit (INDEPENDENT_AMBULATORY_CARE_PROVIDER_SITE_OTHER): Payer: 59 | Admitting: Family Medicine

## 2018-11-16 ENCOUNTER — Other Ambulatory Visit: Payer: Self-pay

## 2018-11-16 ENCOUNTER — Encounter: Payer: Self-pay | Admitting: Family Medicine

## 2018-11-16 DIAGNOSIS — E785 Hyperlipidemia, unspecified: Secondary | ICD-10-CM

## 2018-11-16 DIAGNOSIS — E1121 Type 2 diabetes mellitus with diabetic nephropathy: Secondary | ICD-10-CM

## 2018-11-16 DIAGNOSIS — J4550 Severe persistent asthma, uncomplicated: Secondary | ICD-10-CM | POA: Diagnosis not present

## 2018-11-16 DIAGNOSIS — I10 Essential (primary) hypertension: Secondary | ICD-10-CM

## 2018-11-16 DIAGNOSIS — N183 Chronic kidney disease, stage 3 unspecified: Secondary | ICD-10-CM

## 2018-11-21 ENCOUNTER — Other Ambulatory Visit: Payer: Self-pay

## 2018-11-21 ENCOUNTER — Telehealth: Payer: Self-pay | Admitting: Family Medicine

## 2018-11-21 ENCOUNTER — Other Ambulatory Visit (INDEPENDENT_AMBULATORY_CARE_PROVIDER_SITE_OTHER): Payer: 59

## 2018-11-21 DIAGNOSIS — E1121 Type 2 diabetes mellitus with diabetic nephropathy: Secondary | ICD-10-CM

## 2018-11-21 DIAGNOSIS — E785 Hyperlipidemia, unspecified: Secondary | ICD-10-CM

## 2018-11-21 DIAGNOSIS — I10 Essential (primary) hypertension: Secondary | ICD-10-CM | POA: Diagnosis not present

## 2018-11-21 LAB — LIPID PANEL
Cholesterol: 190 mg/dL (ref 0–200)
HDL: 51.4 mg/dL (ref >39.00)
LDL Cholesterol: 124 mg/dL — ABNORMAL HIGH (ref 0–99)
NonHDL: 138.45
Total CHOL/HDL Ratio: 4
Triglycerides: 71 mg/dL (ref 0.0–149.0)
VLDL: 14.2 mg/dL (ref 0.0–40.0)

## 2018-11-21 LAB — HEMOGLOBIN A1C: Hgb A1c MFr Bld: 6.9 % — ABNORMAL HIGH (ref 4.6–6.5)

## 2018-11-21 LAB — COMPREHENSIVE METABOLIC PANEL
ALT: 21 U/L (ref 0–35)
AST: 22 U/L (ref 0–37)
Albumin: 4.2 g/dL (ref 3.5–5.2)
Alkaline Phosphatase: 83 U/L (ref 39–117)
BUN: 22 mg/dL (ref 6–23)
CO2: 28 mEq/L (ref 19–32)
Calcium: 9.7 mg/dL (ref 8.4–10.5)
Chloride: 103 mEq/L (ref 96–112)
Creatinine, Ser: 1.23 mg/dL — ABNORMAL HIGH (ref 0.40–1.20)
GFR: 53.6 mL/min — ABNORMAL LOW (ref >60.00)
Glucose, Bld: 121 mg/dL — ABNORMAL HIGH (ref 70–99)
Potassium: 4.2 mEq/L (ref 3.5–5.1)
Sodium: 140 mEq/L (ref 135–145)
Total Bilirubin: 0.4 mg/dL (ref 0.2–1.2)
Total Protein: 7.1 g/dL (ref 6.0–8.3)

## 2018-11-21 LAB — CBC
HCT: 36.4 % (ref 36.0–46.0)
Hemoglobin: 11.9 g/dL — ABNORMAL LOW (ref 12.0–15.0)
MCHC: 32.7 g/dL (ref 30.0–36.0)
MCV: 82.2 fl (ref 78.0–100.0)
Platelets: 274 10*3/uL (ref 150.0–400.0)
RBC: 4.43 Mil/uL (ref 3.87–5.11)
RDW: 14.7 % (ref 11.5–15.5)
WBC: 9 10*3/uL (ref 4.0–10.5)

## 2018-11-21 NOTE — Telephone Encounter (Signed)
Pt dropped off document to be filled out by provider [employer] -3 pages) Pt would like to be faxed to [phone]. Document put at front office tray under providers name.

## 2018-11-22 ENCOUNTER — Encounter: Payer: Self-pay | Admitting: Family Medicine

## 2018-11-22 ENCOUNTER — Encounter: Payer: Self-pay | Admitting: Allergy and Immunology

## 2018-11-22 ENCOUNTER — Ambulatory Visit: Payer: 59 | Admitting: Allergy and Immunology

## 2018-11-22 VITALS — BP 140/80 | HR 91 | Resp 16 | Ht 67.5 in | Wt 158.6 lb

## 2018-11-22 DIAGNOSIS — J339 Nasal polyp, unspecified: Secondary | ICD-10-CM | POA: Diagnosis not present

## 2018-11-22 DIAGNOSIS — J4550 Severe persistent asthma, uncomplicated: Secondary | ICD-10-CM | POA: Diagnosis not present

## 2018-11-22 DIAGNOSIS — J3089 Other allergic rhinitis: Secondary | ICD-10-CM | POA: Diagnosis not present

## 2018-11-22 DIAGNOSIS — J324 Chronic pansinusitis: Secondary | ICD-10-CM

## 2018-11-22 DIAGNOSIS — K219 Gastro-esophageal reflux disease without esophagitis: Secondary | ICD-10-CM

## 2018-11-22 NOTE — Patient Instructions (Signed)
  1. DECREASE Advair 230 two inhalations 1 time per day depending on disease activity  2. Add Qvar 80 two inhalations twice a day as part of "Action Plan" during increased asthma activity.  3. Continue pantoprazole 40 mg daily  4. Continue nasal mometasone - 2 sprays each nostril 2 times per day  5. Continue montelukast 10 mg tablet 1 time per day  6.  Continue Dupilumab injections    7.  Continue Xopenex HFA if needed  8. Return to clinic in 6 months or earlier if problem  9. Obtain fall flu vaccine

## 2018-11-22 NOTE — Telephone Encounter (Signed)
Placed packet in providers folder.

## 2018-11-22 NOTE — Progress Notes (Signed)
[facility]   Follow-up Note  Referring Provider: Darreld Mclean, MD Primary Provider: Darreld Mclean, MD Date of Office Visit: 11/22/2018  Subjective:   Ashley Villarreal (DOB: January 12, 1956) is a 63 y.o. female who returns to the [facility] on 11/22/2018 in re-evaluation of the following:  HPI: Ashley Villarreal presents to this clinic in reevaluation of her severe asthma and chronic sinusitis and nasal polyposis and history of LPR.  I last saw her in this clinic on 31 May 2018.  We started her on dupilumab injections in the early spring and these have worked very well regarding all of her respiratory tract symptoms.  She has resolved all lower respiratory tract symptoms and does not need to use a short acting bronchodilator.  She has resolved all upper airway symptoms and can now smell and taste food with no problem at all.  She still continues on Advair twice a day and nasal mometasone twice a day and montelukast.    Her reflux is under excellent control at this point in time using a proton pump inhibitor.  Allergies as of 11/22/2018      Reactions   Claritin [loratadine]    dizziness   Doxycycline Nausea Only   Other Other (See Comments)   Magic mouthwash  Patient get blisters on her lips      Medication List      beclomethasone 80 MCG/ACT inhaler Commonly known as:  QVAR Inhale 2 puffs into the lungs 2 (two) times daily.   blood glucose meter kit and supplies Accucheck test strips and meter. Use up to four times daily as directed. (FOR ICD-10 E10.9, E11.9).   EPINEPHrine 0.3 mg/0.3 mL Soaj injection Commonly known as:  EpiPen 2-Pak Inject 0.3 mLs (0.3 mg total) into the muscle once.   fluticasone-salmeterol 230-21 MCG/ACT inhaler Commonly known as:  Advair HFA INHALE TWO PUFFS TWICE DAILY TO PREVENT COUGH OR WHEEZE. RINSE, GARGLE, AND SPIT AFTER USE.   levalbuterol 45 MCG/ACT inhaler Commonly known as:   XOPENEX HFA Inhale 2 puffs into the lungs every 6 (six) hours as needed for wheezing.   metFORMIN 500 MG tablet Commonly known as:  GLUCOPHAGE TAKE 1 TABLET BY MOUTH TWICE A DAY WITH A MEAL   montelukast 10 MG tablet Commonly known as:  SINGULAIR TAKE ONE TABLET ONCE DAILY AS DIRECTED.   ONE TOUCH ULTRA TEST test strip Generic drug:  glucose blood PLEASE CHECK BLOOD SUGAR DAILY. DX CODE: E11.9   pantoprazole 40 MG tablet Commonly known as:  PROTONIX TAKE 1 TABLET BY MOUTH EVERY DAY   simvastatin 20 MG tablet Commonly known as:  ZOCOR TAKE ONE TABLET BY MOUTH DAILY FOR CHOLESTEROL   sterile water (preservative free) injection Inject 10 mLs as directed as directed.       Past Medical History:  Diagnosis Date  . Allergy   . Asthma   . COPD (chronic obstructive pulmonary disease) ([place])   . Diabetes mellitus without complication ([place])   . Hypertension   . Sleep apnea    uses CPAP    Past Surgical History:  Procedure Laterality Date  . COLONOSCOPY    . DILATION AND CURETTAGE OF UTERUS    . NASAL SINUS SURGERY    . TONSILLECTOMY  1975  . TYMPANOSTOMY TUBE PLACEMENT      Review of systems negative except as noted in HPI / PMHx or noted below:  Review of Systems  Constitutional:  Negative.   HENT: Negative.   Eyes: Negative.   Respiratory: Negative.   Cardiovascular: Negative.   Gastrointestinal: Negative.   Genitourinary: Negative.   Musculoskeletal: Negative.   Skin: Negative.   Neurological: Negative.   Endo/Heme/Allergies: Negative.   Psychiatric/Behavioral: Negative.      Objective:   Vitals:   11/22/18 1054  BP: 140/80  Pulse: 91  Resp: 16  SpO2: 97%   Height: 5' 7.5" (171.5 cm)  Weight: 158 lb 9.6 oz (71.9 kg)   Physical Exam Constitutional:      Appearance: She is not diaphoretic.  HENT:     Head: Normocephalic.     Right Ear: Tympanic membrane, ear canal and external ear normal. There is impacted cerumen.     Left Ear: Tympanic  membrane, ear canal and external ear normal. There is impacted cerumen.     Nose: Nose normal. No mucosal edema or rhinorrhea.     Mouth/Throat:     Pharynx: Uvula midline. No oropharyngeal exudate.  Eyes:     Conjunctiva/sclera: Conjunctivae normal.  Neck:     Thyroid: No thyromegaly.     Trachea: Trachea normal. No tracheal tenderness or tracheal deviation.  Cardiovascular:     Rate and Rhythm: Normal rate and regular rhythm.     Heart sounds: Normal heart sounds, S1 normal and S2 normal. No murmur.  Pulmonary:     Effort: No respiratory distress.     Breath sounds: Normal breath sounds. No stridor. No wheezing or rales.  Lymphadenopathy:     Head:     Right side of head: No tonsillar adenopathy.     Left side of head: No tonsillar adenopathy.     Cervical: No cervical adenopathy.  Skin:    Findings: No erythema or rash.     Nails: There is no clubbing.   Neurological:     Mental Status: She is alert.     Diagnostics:    Spirometry was performed and demonstrated an FEV1 of 1.85 at 78 % of predicted.    Assessment and Plan:   1. Asthma, severe persistent, well-controlled   2. Other allergic rhinitis   3. Chronic pansinusitis   4. Nasal polyposis   5. LPRD (laryngopharyngeal reflux disease)     1. DECREASE Advair 230 two inhalations 1 time per day depending on disease activity  2. Add Qvar 80 two inhalations twice a day as part of "Action Plan" during increased asthma activity.  3. Continue pantoprazole 40 mg daily  4. Continue nasal mometasone - 2 sprays each nostril 2 times per day  5. Continue montelukast 10 mg tablet 1 time per day  6.  Continue Dupilumab injections    7.  Continue Xopenex HFA if needed  8. Return to clinic in 6 months or earlier if problem  9. Obtain fall flu vaccine  Ashley Villarreal has really done very well since starting dupilumab and she has had resolution of most of her respiratory tract symptomatology on this treatment and now we will  attempt to decrease her medication use aiming for the least amount of medications required to maintain good control of her respiratory tract.  We will start by decreasing her Advair to just 1 time per day.  She will continue to use topical mometasone in her nose and montelukast.  As well, her reflux appears to be under very good control on her current plan and she will continue on a proton pump inhibitor.  I will see her back in this clinic  in 6 months or earlier if there is a problem.  Allena Katz, MD Allergy / Immunology [place]

## 2018-11-23 ENCOUNTER — Encounter: Payer: Self-pay | Admitting: Allergy and Immunology

## 2018-11-29 ENCOUNTER — Ambulatory Visit (INDEPENDENT_AMBULATORY_CARE_PROVIDER_SITE_OTHER): Payer: 59 | Admitting: *Deleted

## 2018-11-29 ENCOUNTER — Other Ambulatory Visit: Payer: Self-pay

## 2018-11-29 DIAGNOSIS — J4550 Severe persistent asthma, uncomplicated: Secondary | ICD-10-CM

## 2018-12-08 ENCOUNTER — Other Ambulatory Visit: Payer: Self-pay

## 2018-12-08 ENCOUNTER — Encounter: Payer: Self-pay | Admitting: Internal Medicine

## 2018-12-08 ENCOUNTER — Ambulatory Visit (INDEPENDENT_AMBULATORY_CARE_PROVIDER_SITE_OTHER): Payer: 59 | Admitting: Internal Medicine

## 2018-12-08 DIAGNOSIS — G4733 Obstructive sleep apnea (adult) (pediatric): Secondary | ICD-10-CM

## 2018-12-08 DIAGNOSIS — J4550 Severe persistent asthma, uncomplicated: Secondary | ICD-10-CM | POA: Diagnosis not present

## 2018-12-08 NOTE — Progress Notes (Signed)
HPI female never smoker followed for OSA, complicated by HBP, asthma/Xolair (Dr. Neldon Mc) NPSG 02/11/15- AHI 12.5 per hour, CPAP to 9   --------------------------------------------------------------------------------  12/07/2017- 63 year old female never smoker followed for OSA, complicated by HBP, asthma/Xolair (Dr. Neldon Mc), DM2, GERD, CKD3,  CPAP 8/Advanced -----OSA; DME [facility]. Pt states she wears CPAP nightly-pressure works well and no new supplies needed at this time. DL attached.  Download compliance 97% AHI 2.6/hour.  She sleeps better and CPAP prevents her snoring.  She describes decreased sense of smell and taste and is going for ear examination questioning decreased hearing.   ROS-see HPI   + = positive Constitutional:    weight loss, night sweats, fevers, chills, fatigue, lassitude. HEENT:    headaches, difficulty swallowing, tooth/dental problems, sore throat,       sneezing, itching, ear ache, + nasal congestion, post nasal drip, snoring CV:    chest pain, orthopnea, PND, swelling in lower extremities, anasarca,                                                      dizziness, palpitations Resp:   shortness of breath with exertion or at rest.                productive cough,   non-productive cough, coughing up of blood.              change in color of mucus.  wheezing.   Skin:    rash or lesions. GI:  No-   heartburn, indigestion, abdominal pain, nausea, vomiting,  GU: . MS:   joint pain, stiffness, decreased range of motion, back pain. Neuro-     nothing unusual Psych:  change in mood or affect.  depression or anxiety.   memory loss.  OBJ- Physical Exam General- Alert, Oriented, Affect-appropriate, Distress- none acute, normal weight Skin- rash-none, lesions- none, excoriation- none Lymphadenopathy- none Head- atraumatic            Eyes- Gross vision intact, PERRLA, conjunctivae and secretions clear            Ears- Hearing, canals-normal            Nose- + turbinate edema,  no-Septal dev, mucus, polyps, erosion, perforation             Throat- Mallampati III-IV , mucosa clear , drainage- none, tonsils- atrophic Neck- flexible , trachea midline, no stridor , thyroid nl, carotid no bruit Chest - symmetrical excursion , unlabored           Heart/CV- RRR , no murmur , no gallop  , no rub, nl s1 s2                           - JVD- none , edema- none, stasis changes- none, varices- none           Lung- clear to P&A, wheeze- none, cough- none , dullness-none, rub- none           Chest wall-  Abd-  Br/ Gen/ Rectal- Not done, not indicated Extrem- cyanosis- none, clubbing, none, atrophy- none, strength- nl Neuro- grossly intact to observation   12/08/2018- Virtual Visit via Telephone Note  I connected with Juanna Cao on 12/08/18 at  9:30 AM EDT by telephone and verified that I am  speaking with the correct person using two identifiers.  Location: Patient: H Provider: O   I discussed the limitations, risks, security and privacy concerns of performing an evaluation and management service by telephone and the availability of in person appointments. I also discussed with the patient that there may be a patient responsible charge related to this service. The patient expressed understanding and agreed to proceed.   History of Present Illness: 63 year old female never smoker followed for OSA, complicated by HBP, asthma/ Dupixent (Dr. Neldon Mc), DM2, GERD, CKD3,  CPAP 8/Advanced Download compliance 97%, AHI 3.1/ hr -----OSA on CPAP; DME: [facility], no complaints, taking Dupixent (started in March 2020) managed by Dr Neldon Mc. Machine is working well- about 62 yrs old.  Observations/Objective: Download 97% compliance, AHI 3.1/ hr  Assessment and Plan: OSA- good compliance and control, benefits plan no changes Asthma- no exacerbation- managed by allergist  Follow Up Instructions: 1 year    Consider change to auto 5-15 when machine replaced next year   I discussed the  assessment and treatment plan with the patient. The patient was provided an opportunity to ask questions and all were answered. The patient agreed with the plan and demonstrated an understanding of the instructions.   The patient was advised to call back or seek an in-person evaluation if the symptoms worsen or if the condition fails to improve as anticipated.  I provided 18 minutes of non-face-to-face time during this encounter.   Baird Lyons, MD

## 2018-12-08 NOTE — Patient Instructions (Signed)
I'm glad you are doing well. We can continue CPAP 8, mask of choice, humidifier, supplies, AirView/ card.  Next year, when your machine is 62 years old, it may be appropriate to talk about replacing it and changing to autopap, but that discussion can wait as long as you are ok.  Please call if we can help

## 2018-12-13 ENCOUNTER — Other Ambulatory Visit: Payer: Self-pay

## 2018-12-13 ENCOUNTER — Ambulatory Visit (INDEPENDENT_AMBULATORY_CARE_PROVIDER_SITE_OTHER): Payer: 59 | Admitting: *Deleted

## 2018-12-13 DIAGNOSIS — J4550 Severe persistent asthma, uncomplicated: Secondary | ICD-10-CM

## 2018-12-27 ENCOUNTER — Other Ambulatory Visit: Payer: Self-pay

## 2018-12-27 ENCOUNTER — Ambulatory Visit (INDEPENDENT_AMBULATORY_CARE_PROVIDER_SITE_OTHER): Payer: 59 | Admitting: *Deleted

## 2018-12-27 DIAGNOSIS — J4550 Severe persistent asthma, uncomplicated: Secondary | ICD-10-CM

## 2019-01-10 ENCOUNTER — Ambulatory Visit (INDEPENDENT_AMBULATORY_CARE_PROVIDER_SITE_OTHER): Payer: 59 | Admitting: *Deleted

## 2019-01-10 ENCOUNTER — Other Ambulatory Visit: Payer: Self-pay

## 2019-01-10 DIAGNOSIS — J4550 Severe persistent asthma, uncomplicated: Secondary | ICD-10-CM | POA: Diagnosis not present

## 2019-01-25 ENCOUNTER — Ambulatory Visit (INDEPENDENT_AMBULATORY_CARE_PROVIDER_SITE_OTHER): Payer: 59 | Admitting: *Deleted

## 2019-01-25 ENCOUNTER — Other Ambulatory Visit: Payer: Self-pay

## 2019-01-25 DIAGNOSIS — J4550 Severe persistent asthma, uncomplicated: Secondary | ICD-10-CM | POA: Diagnosis not present

## 2019-01-28 NOTE — Assessment & Plan Note (Signed)
Managed now on [place] by Allergist

## 2019-01-28 NOTE — Assessment & Plan Note (Signed)
Continues to benefit from CPAP with good compliance and control . Plan- when due for replacement machine at 5 yrs, consider change to auto 5-15

## 2019-01-30 ENCOUNTER — Other Ambulatory Visit: Payer: Self-pay | Admitting: *Deleted

## 2019-01-30 MED ORDER — PANTOPRAZOLE SODIUM 40 MG PO TBEC: 40.0000 mg | DELAYED_RELEASE_TABLET | Freq: Every day | ORAL | 0 refills | Status: DC

## 2019-02-08 ENCOUNTER — Ambulatory Visit (INDEPENDENT_AMBULATORY_CARE_PROVIDER_SITE_OTHER): Payer: 59 | Admitting: *Deleted

## 2019-02-08 ENCOUNTER — Other Ambulatory Visit: Payer: Self-pay

## 2019-02-08 DIAGNOSIS — J4550 Severe persistent asthma, uncomplicated: Secondary | ICD-10-CM | POA: Diagnosis not present

## 2019-02-08 MED ORDER — DUPILUMAB 300 MG/2ML ~~LOC~~ SOSY
300.0000 mg | PREFILLED_SYRINGE | SUBCUTANEOUS | Status: AC
  Administered 2019-02-08 – 2024-07-25 (×140): 300 mg via SUBCUTANEOUS

## 2019-02-08 NOTE — Addendum Note (Signed)
Addended by: Chip Boer R on: 02/08/2019 10:39 AM   Modules accepted: Orders

## 2019-02-20 ENCOUNTER — Ambulatory Visit: Payer: Self-pay

## 2019-02-22 ENCOUNTER — Other Ambulatory Visit: Payer: Self-pay

## 2019-02-22 ENCOUNTER — Ambulatory Visit (INDEPENDENT_AMBULATORY_CARE_PROVIDER_SITE_OTHER): Payer: 59 | Admitting: *Deleted

## 2019-02-22 DIAGNOSIS — J4550 Severe persistent asthma, uncomplicated: Secondary | ICD-10-CM | POA: Diagnosis not present

## 2019-03-07 ENCOUNTER — Ambulatory Visit (INDEPENDENT_AMBULATORY_CARE_PROVIDER_SITE_OTHER): Payer: 59 | Admitting: *Deleted

## 2019-03-07 ENCOUNTER — Other Ambulatory Visit: Payer: Self-pay

## 2019-03-07 DIAGNOSIS — J4550 Severe persistent asthma, uncomplicated: Secondary | ICD-10-CM

## 2019-03-21 ENCOUNTER — Ambulatory Visit (INDEPENDENT_AMBULATORY_CARE_PROVIDER_SITE_OTHER): Payer: 59 | Admitting: *Deleted

## 2019-03-21 ENCOUNTER — Other Ambulatory Visit: Payer: Self-pay

## 2019-03-21 DIAGNOSIS — J4550 Severe persistent asthma, uncomplicated: Secondary | ICD-10-CM

## 2019-04-03 ENCOUNTER — Other Ambulatory Visit: Payer: Self-pay

## 2019-04-03 ENCOUNTER — Ambulatory Visit (INDEPENDENT_AMBULATORY_CARE_PROVIDER_SITE_OTHER): Payer: 59

## 2019-04-03 DIAGNOSIS — J4550 Severe persistent asthma, uncomplicated: Secondary | ICD-10-CM | POA: Diagnosis not present

## 2019-04-17 ENCOUNTER — Ambulatory Visit (INDEPENDENT_AMBULATORY_CARE_PROVIDER_SITE_OTHER): Payer: 59 | Admitting: *Deleted

## 2019-04-17 ENCOUNTER — Other Ambulatory Visit: Payer: Self-pay

## 2019-04-17 DIAGNOSIS — J4550 Severe persistent asthma, uncomplicated: Secondary | ICD-10-CM | POA: Diagnosis not present

## 2019-04-24 ENCOUNTER — Ambulatory Visit (INDEPENDENT_AMBULATORY_CARE_PROVIDER_SITE_OTHER): Payer: 59

## 2019-04-24 ENCOUNTER — Other Ambulatory Visit: Payer: Self-pay | Admitting: Allergy and Immunology

## 2019-04-24 ENCOUNTER — Other Ambulatory Visit: Payer: Self-pay | Admitting: Family Medicine

## 2019-04-24 DIAGNOSIS — Z23 Encounter for immunization: Secondary | ICD-10-CM

## 2019-04-24 DIAGNOSIS — E785 Hyperlipidemia, unspecified: Secondary | ICD-10-CM

## 2019-05-01 ENCOUNTER — Other Ambulatory Visit: Payer: Self-pay

## 2019-05-01 ENCOUNTER — Ambulatory Visit (INDEPENDENT_AMBULATORY_CARE_PROVIDER_SITE_OTHER): Payer: 59

## 2019-05-01 DIAGNOSIS — J4550 Severe persistent asthma, uncomplicated: Secondary | ICD-10-CM | POA: Diagnosis not present

## 2019-05-15 ENCOUNTER — Ambulatory Visit (INDEPENDENT_AMBULATORY_CARE_PROVIDER_SITE_OTHER): Payer: 59

## 2019-05-15 ENCOUNTER — Other Ambulatory Visit: Payer: Self-pay

## 2019-05-15 DIAGNOSIS — J4550 Severe persistent asthma, uncomplicated: Secondary | ICD-10-CM

## 2019-05-17 ENCOUNTER — Other Ambulatory Visit: Payer: Self-pay | Admitting: Family Medicine

## 2019-05-17 DIAGNOSIS — Z1231 Encounter for screening mammogram for malignant neoplasm of breast: Secondary | ICD-10-CM

## 2019-05-20 NOTE — Progress Notes (Addendum)
[facility] [address] [place], [place] [address] [phone]  Date:  05/22/2019   Name:  Ashley Villarreal   DOB:  [birth year]   MRN:  [id number]  PCP:  Darreld Mclean, MD    Chief Complaint: Annual Exam (Fasting)   History of Present Illness:  Ashley Villarreal is a 63 y.o. very pleasant female patient who presents with the following:  Here today for routine physical History of hyperlipidemia, hypertension, diabetes, chronic kidney disease, GERD, asthma, OSA She is receiving immunotherapy per asthma and allergy services- she does feel like this is helping her nasal symptoms.  They will do shots indefinitely to keep her symptoms under control  Last seen by myself in May-at that time she was doing well, walking about 6 miles a day She is still walking for exercise and feeling fine.  No chest pain or shortness of breath She is retired Her nephrologist is Dr. Arlice Colt patient last visit virtual earlier this year Never a smoker  Eye exam; she is due for this, just about one year ago  Foot exam is due A1c due today Pap up-to-date- 2018.  Would like to update today  Mammogram scheduled for January Colonoscopy up-to-date Flu shot done Shingrix- discussed with her today. Most recent labs in June  Metformin 500 twice daily Protonix Simvastatin  She is overall doing well, her mother did die about 2 weeks ago at 38 years old.  She had heart disease. Ashley Villarreal notes that her mother lived a good and full life, she is managing her loss okay  Lab Results  Component Value Date   HGBA1C 6.6 (H) 05/22/2019     Patient Active Problem List   Diagnosis Date Noted  . CKD (chronic kidney disease) stage 3, GFR 30-59 ml/min 04/13/2018  . Gastroesophageal reflux disease without esophagitis 06/09/2017  . Anosmia 06/09/2017  . Asthma, severe persistent, well-controlled 06/12/2015  . LPRD (laryngopharyngeal  reflux disease) 06/12/2015  . Allergic rhinoconjunctivitis 06/12/2015  . Allergy with anaphylaxis due to food 06/12/2015  . Elevated serum creatinine 05/01/2015  . Sleep apnea, obstructive   . Hyperlipemia 08/10/2011  . Hypertensive heart disease   . Diabetes mellitus type 2, noninsulin dependent ([place])     Past Medical History:  Diagnosis Date  . Allergy   . Asthma   . COPD (chronic obstructive pulmonary disease) ([place])   . Diabetes mellitus without complication ([place])   . Hypertension   . Sleep apnea    uses CPAP    Past Surgical History:  Procedure Laterality Date  . COLONOSCOPY    . DILATION AND CURETTAGE OF UTERUS    . NASAL SINUS SURGERY    . TONSILLECTOMY  1975  . TYMPANOSTOMY TUBE PLACEMENT      Social History   Tobacco Use  . Smoking status: Never Smoker  . Smokeless tobacco: Never Used  Substance Use Topics  . Alcohol use: No    Alcohol/week: 0.0 standard drinks  . Drug use: No    Family History  Problem Relation Age of Onset  . Stroke Father   . Heart disease Father   . Diabetes Mother   . Heart disease Mother   . Stroke Mother   . Hyperlipidemia Sister   . Hyperlipidemia Brother   . Diabetes Paternal Grandmother   . Hypertension Brother   . Colon cancer Neg Hx   . Allergic rhinitis Neg Hx   . Angioedema Neg  Hx   . Asthma Neg Hx   . Atopy Neg Hx   . Eczema Neg Hx   . Immunodeficiency Neg Hx   . Urticaria Neg Hx     Allergies  Allergen Reactions  . Claritin [Loratadine]     dizziness  . Doxycycline Nausea Only  . Other Other (See Comments)    Magic mouthwash  Patient get blisters on her lips    Medication list has been reviewed and updated.  Current Outpatient Medications on File Prior to Visit  Medication Sig Dispense Refill  . amLODipine-valsartan (EXFORGE) 5-320 MG tablet Take 1 tablet by mouth daily.    . beclomethasone (QVAR) 80 MCG/ACT inhaler Inhale 2 puffs into the lungs 2 (two) times daily. (Patient not taking: Reported on  05/23/2019) 3 Inhaler 3  . blood glucose meter kit and supplies Accucheck test strips and meter. Use up to four times daily as directed. (FOR ICD-10 E10.9, E11.9). 1 each 0  . budesonide (PULMICORT) 0.5 MG/2ML nebulizer solution RINSE SINUSES W/ HALF OF SINUS RINSE BOTTLE THEN MIX ONE RESPULE IN BOTTLE AND RINSE SINUSES W/ REST    . dupilumab (DUPIXENT) 300 MG/2ML prefilled syringe Inject into the skin.    Marland Kitchen EPINEPHrine (EPIPEN 2-PAK) 0.3 mg/0.3 mL IJ SOAJ injection Inject 0.3 mLs (0.3 mg total) into the muscle once. 2 Device 1  . Hydroquinone-Sunscreens (ALPHAQUIN HP) 4 % CREA APPLY TO DARK SPOTS DAILY.  3  . ONE TOUCH ULTRA TEST test strip PLEASE CHECK BLOOD SUGAR DAILY. DX CODE: E11.9 300 each 6  . simvastatin (ZOCOR) 20 MG tablet TAKE 1 TABLET BY MOUTH EVERY DAY FOR CHOLESTEROL 90 tablet 3  . Water For Injection Sterile (STERILE WATER, PRESERVATIVE FREE,) injection Inject 10 mLs as directed as directed. 10 mL 11   Current Facility-Administered Medications on File Prior to Visit  Medication Dose Route Frequency Provider Last Rate Last Dose  . dupilumab (DUPIXENT) prefilled syringe 300 mg  300 mg Subcutaneous Q14 Days Kozlow, Donnamarie Poag, MD   300 mg at 05/15/19 1605  . omalizumab Arvid Right) injection 150 mg  150 mg Subcutaneous Q28 days Jiles Prows, MD   150 mg at 07/25/18 1118    Review of Systems:  As per HPI- otherwise negative. No chest pain or shortness of breath, no fever or chills  Physical Examination: Vitals:   05/22/19 0956  BP: 140/82  Pulse: 87  Resp: 17  Temp: (!) 96.5 F (35.8 C)  SpO2: 98%   Vitals:   05/22/19 0956  Weight: 159 lb 8 oz (72.3 kg)  Height: 5' 8"  (1.727 m)   Body mass index is 24.25 kg/m. Ideal Body Weight: Weight in (lb) to have BMI = 25: 164.1  GEN: WDWN, NAD, Non-toxic, A & O x 3, normal weight, looks well  HEENT: Atraumatic, Normocephalic. Neck supple. No masses, No LAD.  TM tube left only  Ears and Nose: No external deformity. CV: RRR, No  M/G/R. No JVD. No thrill. No extra heart sounds. PULM: CTA B, no wheezes, crackles, rhonchi. No retractions. No resp. distress. No accessory muscle use. ABD: S, NT, ND, +BS. No rebound. No HSM. EXTR: No c/c/e NEURO Normal gait.  PSYCH: Normally interactive. Conversant. Not depressed or anxious appearing.  Calm demeanor.  Foot exam today- normal  Breast: normal exam, no masses/ dimpling/ discharge Pelvic: normal, no vaginal lesions or discharge. Uterus normal, no CMT, no adnexal tendereness or masses   Assessment and Plan: Physical exam  Controlled type 2 diabetes  mellitus with diabetic nephropathy, without long-term current use of insulin ([place]) - Plan: Comprehensive metabolic panel, Hemoglobin A1c, metFORMIN (GLUCOPHAGE) 500 MG tablet  Essential hypertension - Plan: CBC, Comprehensive metabolic panel  Hyperlipidemia, unspecified hyperlipidemia type - Plan: Lipid panel  Stage 3a chronic kidney disease  Asthma, severe persistent, well-controlled  Screening for thyroid disorder - Plan: TSH  Screening for cervical cancer - Plan: Cytology - PAP  Here today for complete physical Labs pending as above Mammogram scheduled, Pap smear done today Flu shot up-to-date, she wishes to defer shingles vaccine Will plan further follow- up pending labs.   This visit occurred during the SARS-CoV-2 public health emergency.  Safety protocols were in place, including screening questions prior to the visit, additional usage of staff PPE, and extensive cleaning of exam room while observing appropriate contact time as indicated for disinfecting solutions.    Signed Lamar Blinks, MD  Received her labs, message to patient Results for orders placed or performed in visit on 05/22/19  CBC  Result Value Ref Range   WBC 7.6 4.0 - 10.5 K/uL   RBC 4.35 3.87 - 5.11 Mil/uL   Platelets 269.0 150.0 - 400.0 K/uL   Hemoglobin 11.7 (L) 12.0 - 15.0 g/dL   HCT 36.1 36.0 - 46.0 %   MCV 83.0 78.0 - 100.0 fl    MCHC 32.4 30.0 - 36.0 g/dL   RDW 14.6 11.5 - 15.5 %  Comprehensive metabolic panel  Result Value Ref Range   Sodium 141 135 - 145 mEq/L   Potassium 4.0 3.5 - 5.1 mEq/L   Chloride 103 96 - 112 mEq/L   CO2 31 19 - 32 mEq/L   Glucose, Bld 105 (H) 70 - 99 mg/dL   BUN 17 6 - 23 mg/dL   Creatinine, Ser 1.11 0.40 - 1.20 mg/dL   Total Bilirubin 0.4 0.2 - 1.2 mg/dL   Alkaline Phosphatase 67 39 - 117 U/L   AST 17 0 - 37 U/L   ALT 18 0 - 35 U/L   Total Protein 7.0 6.0 - 8.3 g/dL   Albumin 4.0 3.5 - 5.2 g/dL   GFR 60.24 >60.00 mL/min   Calcium 9.8 8.4 - 10.5 mg/dL  Hemoglobin A1c  Result Value Ref Range   Hgb A1c MFr Bld 6.6 (H) 4.6 - 6.5 %  Lipid panel  Result Value Ref Range   Cholesterol 193 0 - 200 mg/dL   Triglycerides 93.0 0.0 - 149.0 mg/dL   HDL 43.60 >39.00 mg/dL   VLDL 18.6 0.0 - 40.0 mg/dL   LDL Cholesterol 131 (H) 0 - 99 mg/dL   Total CHOL/HDL Ratio 4    NonHDL 149.12   TSH  Result Value Ref Range   TSH 0.99 0.35 - 4.50 uIU/mL  Cytology - PAP  Result Value Ref Range   High risk HPV Negative    Adequacy      Satisfactory for evaluation; transformation zone component PRESENT.   Diagnosis      - Negative for intraepithelial lesion or malignancy (NILM)   Comment Normal Reference Range HPV - Negative    Your A1c shows good control of diabetes Thyroid is normal Blood counts okay-very minimal anemia is baseline Metabolic profile normal  Your cholesterol is reasonable-however we might consider changing from Zocor to Crestor in an effort to lower your LDL.  Please let me know if okay with you, and I will make this exchange  Take care, let us plan to visit in 6 months  Received pap  12/1- message to pt

## 2019-05-20 NOTE — Patient Instructions (Addendum)
It was a pleasure to see you again today, I will be in touch with your labs and pap ASAP Please schedule your annual eye exam when you can  I am so sorry for the loss of your mom earlier this month  We can plan to do shingles vaccine at a later date    Health Maintenance, Female Adopting a healthy lifestyle and getting preventive care are important in promoting health and wellness. Ask your health care provider about:  The right schedule for you to have regular tests and exams.  Things you can do on your own to prevent diseases and keep yourself healthy. What should I know about diet, weight, and exercise? Eat a healthy diet   Eat a diet that includes plenty of vegetables, fruits, low-fat dairy products, and lean protein.  Do not eat a lot of foods that are high in solid fats, added sugars, or sodium. Maintain a healthy weight Body mass index (BMI) is used to identify weight problems. It estimates body fat based on height and weight. Your health care provider can help determine your BMI and help you achieve or maintain a healthy weight. Get regular exercise Get regular exercise. This is one of the most important things you can do for your health. Most adults should:  Exercise for at least 150 minutes each week. The exercise should increase your heart rate and make you sweat (moderate-intensity exercise).  Do strengthening exercises at least twice a week. This is in addition to the moderate-intensity exercise.  Spend less time sitting. Even light physical activity can be beneficial. Watch cholesterol and blood lipids Have your blood tested for lipids and cholesterol at 62 years of age, then have this test every 5 years. Have your cholesterol levels checked more often if:  Your lipid or cholesterol levels are high.  You are older than 62 years of age.  You are at high risk for heart disease. What should I know about cancer screening? Depending on your health history and family  history, you may need to have cancer screening at various ages. This may include screening for:  Breast cancer.  Cervical cancer.  Colorectal cancer.  Skin cancer.  Lung cancer. What should I know about heart disease, diabetes, and high blood pressure? Blood pressure and heart disease  High blood pressure causes heart disease and increases the risk of stroke. This is more likely to develop in people who have high blood pressure readings, are of African descent, or are overweight.  Have your blood pressure checked: ? Every 3-5 years if you are 7-38 years of age. ? Every year if you are 53 years old or older. Diabetes Have regular diabetes screenings. This checks your fasting blood sugar level. Have the screening done:  Once every three years after age 59 if you are at a normal weight and have a low risk for diabetes.  More often and at a younger age if you are overweight or have a high risk for diabetes. What should I know about preventing infection? Hepatitis B If you have a higher risk for hepatitis B, you should be screened for this virus. Talk with your health care provider to find out if you are at risk for hepatitis B infection. Hepatitis C Testing is recommended for:  Everyone born from [birth year] through 1964.  Anyone with known risk factors for hepatitis C. Sexually transmitted infections (STIs)  Get screened for STIs, including gonorrhea and chlamydia, if: ? You are sexually active and are younger  than 63 years of age. ? You are older than 62 years of age and your health care provider tells you that you are at risk for this type of infection. ? Your sexual activity has changed since you were last screened, and you are at increased risk for chlamydia or gonorrhea. Ask your health care provider if you are at risk.  Ask your health care provider about whether you are at high risk for HIV. Your health care provider may recommend a prescription medicine to help prevent HIV  infection. If you choose to take medicine to prevent HIV, you should first get tested for HIV. You should then be tested every 3 months for as long as you are taking the medicine. Pregnancy  If you are about to stop having your period (premenopausal) and you may become pregnant, seek counseling before you get pregnant.  Take 400 to 800 micrograms (mcg) of folic acid every day if you become pregnant.  Ask for birth control (contraception) if you want to prevent pregnancy. Osteoporosis and menopause Osteoporosis is a disease in which the bones lose minerals and strength with aging. This can result in bone fractures. If you are 42 years old or older, or if you are at risk for osteoporosis and fractures, ask your health care provider if you should:  Be screened for bone loss.  Take a calcium or vitamin D supplement to lower your risk of fractures.  Be given hormone replacement therapy (HRT) to treat symptoms of menopause. Follow these instructions at home: Lifestyle  Do not use any products that contain nicotine or tobacco, such as cigarettes, e-cigarettes, and chewing tobacco. If you need help quitting, ask your health care provider.  Do not use street drugs.  Do not share needles.  Ask your health care provider for help if you need support or information about quitting drugs. Alcohol use  Do not drink alcohol if: ? Your health care provider tells you not to drink. ? You are pregnant, may be pregnant, or are planning to become pregnant.  If you drink alcohol: ? Limit how much you use to 0-1 drink a day. ? Limit intake if you are breastfeeding.  Be aware of how much alcohol is in your drink. In the [place] one drink equals one 12 oz bottle of beer (355 mL), one 5 oz glass of wine (148 mL), or one 1 oz glass of hard liquor (44 mL). General instructions  Schedule regular health, dental, and eye exams.  Stay current with your vaccines.  Tell your health care provider if: ? You  often feel depressed. ? You have ever been abused or do not feel safe at home. Summary  Adopting a healthy lifestyle and getting preventive care are important in promoting health and wellness.  Follow your health care provider's instructions about healthy diet, exercising, and getting tested or screened for diseases.  Follow your health care provider's instructions on monitoring your cholesterol and blood pressure. This information is not intended to replace advice given to you by your health care provider. Make sure you discuss any questions you have with your health care provider. Document Released: 12/22/2010 Document Revised: 06/01/2018 Document Reviewed: 06/01/2018 Elsevier Patient Education  2020 [employer].

## 2019-05-22 ENCOUNTER — Other Ambulatory Visit (HOSPITAL_COMMUNITY)
Admission: RE | Admit: 2019-05-22 | Discharge: 2019-05-22 | Disposition: A | Discharge status: Home / Self Care | Payer: 59 | Source: Ambulatory Visit | Attending: Family Medicine | Admitting: Family Medicine

## 2019-05-22 ENCOUNTER — Ambulatory Visit: Payer: 59 | Admitting: Family Medicine

## 2019-05-22 ENCOUNTER — Encounter: Payer: Self-pay | Admitting: Family Medicine

## 2019-05-22 ENCOUNTER — Other Ambulatory Visit: Payer: Self-pay

## 2019-05-22 ENCOUNTER — Ambulatory Visit (INDEPENDENT_AMBULATORY_CARE_PROVIDER_SITE_OTHER): Payer: 59 | Admitting: Family Medicine

## 2019-05-22 VITALS — BP 140/82 | HR 87 | Temp 96.5°F | Resp 17 | Ht 68.0 in | Wt 159.5 lb

## 2019-05-22 DIAGNOSIS — Z0000 Encounter for general adult medical examination without abnormal findings: Secondary | ICD-10-CM | POA: Diagnosis not present

## 2019-05-22 DIAGNOSIS — Z124 Encounter for screening for malignant neoplasm of cervix: Secondary | ICD-10-CM

## 2019-05-22 DIAGNOSIS — N1831 Chronic kidney disease, stage 3a: Secondary | ICD-10-CM

## 2019-05-22 DIAGNOSIS — I10 Essential (primary) hypertension: Secondary | ICD-10-CM | POA: Diagnosis not present

## 2019-05-22 DIAGNOSIS — E785 Hyperlipidemia, unspecified: Secondary | ICD-10-CM | POA: Diagnosis not present

## 2019-05-22 DIAGNOSIS — Z1329 Encounter for screening for other suspected endocrine disorder: Secondary | ICD-10-CM | POA: Diagnosis not present

## 2019-05-22 DIAGNOSIS — E1121 Type 2 diabetes mellitus with diabetic nephropathy: Secondary | ICD-10-CM | POA: Diagnosis not present

## 2019-05-22 DIAGNOSIS — J4550 Severe persistent asthma, uncomplicated: Secondary | ICD-10-CM

## 2019-05-22 LAB — CBC
HCT: 36.1 % (ref 36.0–46.0)
Hemoglobin: 11.7 g/dL — ABNORMAL LOW (ref 12.0–15.0)
MCHC: 32.4 g/dL (ref 30.0–36.0)
MCV: 83 fl (ref 78.0–100.0)
Platelets: 269 10*3/uL (ref 150.0–400.0)
RBC: 4.35 Mil/uL (ref 3.87–5.11)
RDW: 14.6 % (ref 11.5–15.5)
WBC: 7.6 10*3/uL (ref 4.0–10.5)

## 2019-05-22 LAB — LIPID PANEL
Cholesterol: 193 mg/dL (ref 0–200)
HDL: 43.6 mg/dL (ref >39.00)
LDL Cholesterol: 131 mg/dL — ABNORMAL HIGH (ref 0–99)
NonHDL: 149.12
Total CHOL/HDL Ratio: 4
Triglycerides: 93 mg/dL (ref 0.0–149.0)
VLDL: 18.6 mg/dL (ref 0.0–40.0)

## 2019-05-22 LAB — COMPREHENSIVE METABOLIC PANEL
ALT: 18 U/L (ref 0–35)
AST: 17 U/L (ref 0–37)
Albumin: 4 g/dL (ref 3.5–5.2)
Alkaline Phosphatase: 67 U/L (ref 39–117)
BUN: 17 mg/dL (ref 6–23)
CO2: 31 mEq/L (ref 19–32)
Calcium: 9.8 mg/dL (ref 8.4–10.5)
Chloride: 103 mEq/L (ref 96–112)
Creatinine, Ser: 1.11 mg/dL (ref 0.40–1.20)
GFR: 60.24 mL/min (ref >60.00)
Glucose, Bld: 105 mg/dL — ABNORMAL HIGH (ref 70–99)
Potassium: 4 mEq/L (ref 3.5–5.1)
Sodium: 141 mEq/L (ref 135–145)
Total Bilirubin: 0.4 mg/dL (ref 0.2–1.2)
Total Protein: 7 g/dL (ref 6.0–8.3)

## 2019-05-22 LAB — TSH: TSH: 0.99 u[IU]/mL (ref 0.35–4.50)

## 2019-05-22 LAB — HEMOGLOBIN A1C: Hgb A1c MFr Bld: 6.6 % — ABNORMAL HIGH (ref 4.6–6.5)

## 2019-05-22 MED ORDER — METFORMIN HCL 500 MG PO TABS: ORAL_TABLET | ORAL | 3 refills | Status: DC

## 2019-05-23 ENCOUNTER — Encounter: Payer: Self-pay | Admitting: Family Medicine

## 2019-05-23 ENCOUNTER — Ambulatory Visit (INDEPENDENT_AMBULATORY_CARE_PROVIDER_SITE_OTHER): Payer: 59 | Admitting: Allergy and Immunology

## 2019-05-23 ENCOUNTER — Encounter: Payer: Self-pay | Admitting: Allergy and Immunology

## 2019-05-23 VITALS — BP 112/74 | HR 82 | Temp 97.9°F | Resp 18

## 2019-05-23 DIAGNOSIS — J4550 Severe persistent asthma, uncomplicated: Secondary | ICD-10-CM

## 2019-05-23 DIAGNOSIS — J339 Nasal polyp, unspecified: Secondary | ICD-10-CM | POA: Diagnosis not present

## 2019-05-23 DIAGNOSIS — J3089 Other allergic rhinitis: Secondary | ICD-10-CM | POA: Diagnosis not present

## 2019-05-23 DIAGNOSIS — J324 Chronic pansinusitis: Secondary | ICD-10-CM | POA: Diagnosis not present

## 2019-05-23 DIAGNOSIS — K219 Gastro-esophageal reflux disease without esophagitis: Secondary | ICD-10-CM

## 2019-05-23 LAB — CYTOLOGY - PAP
Comment: NEGATIVE
Diagnosis: NEGATIVE
High risk HPV: NEGATIVE

## 2019-05-23 MED ORDER — LEVALBUTEROL TARTRATE 45 MCG/ACT IN AERO: 2.0000 | INHALATION_SPRAY | Freq: Four times a day (QID) | RESPIRATORY_TRACT | 3 refills | Status: DC | PRN

## 2019-05-23 MED ORDER — MONTELUKAST SODIUM 10 MG PO TABS: ORAL_TABLET | ORAL | 5 refills | Status: DC

## 2019-05-23 MED ORDER — MOMETASONE FUROATE 50 MCG/ACT NA SUSP: 1.0000 | Freq: Two times a day (BID) | NASAL | 5 refills | Status: DC

## 2019-05-23 MED ORDER — ADVAIR HFA 230-21 MCG/ACT IN AERO: INHALATION_SPRAY | RESPIRATORY_TRACT | 5 refills | Status: DC

## 2019-05-23 MED ORDER — PANTOPRAZOLE SODIUM 40 MG PO TBEC: 40.0000 mg | DELAYED_RELEASE_TABLET | Freq: Every day | ORAL | 5 refills | Status: DC

## 2019-05-23 NOTE — Patient Instructions (Addendum)
  1. Continue Advair 230 two inhalations 1- 2 time per day depending on disease activity  2. Continue pantoprazole 40 mg daily  3. Continue nasal mometasone - 1- 2 sprays each nostril 2 times per day  4. Continue montelukast 10 mg tablet 1 time per day  5.  Continue Dupilumab injections    6.  Continue Xopenex HFA if needed  7. Return to clinic in 6 months or earlier if problem  8. Obtain COVID vaccine when available

## 2019-05-23 NOTE — Progress Notes (Signed)
[facility]   Follow-up Note  Referring Provider: Darreld Mclean, MD Primary Provider: Darreld Mclean, MD Date of Office Visit: 05/23/2019  Subjective:   Ashley Villarreal (DOB: Oct 13, 1955) is a 63 y.o. female who returns to the [facility] on 05/23/2019 in re-evaluation of the following:  HPI: Cariah returns to this clinic in evaluation of severe asthma and chronic sinusitis with nasal polyposis treated with dupilumab and a history of LPR.  Her last visit to this clinic was 22 November 2018.  As she had done during her previous 40-monthinterval, she has continued to do well with her airway and does not have the need to use a short acting bronchodilator and has not required a systemic steroid or antibiotic for any type of airway issue.  Her nose is really doing very well and she can smell and taste.  She has consolidated her Advair to 1 time per day and continues on nasal mometasone twice a day along with montelukast and dupilumab.  She has no problems with reflux on her current therapy.  She did receive the flu vaccine.  Allergies as of 05/23/2019      Reactions   Claritin [loratadine]    dizziness   Doxycycline Nausea Only   Other Other (See Comments)   Magic mouthwash  Patient get blisters on her lips      Medication List      Advair HFA 230-21 MCG/ACT inhaler Generic drug: fluticasone-salmeterol INHALE TWO PUFFS 1-2 TIMES DAILY. RINSE, GARGLE, AND SPIT AFTER USE.   Alphaquin HP 4 % Crea APPLY TO DARK SPOTS DAILY.   amLODipine-valsartan 5-320 MG tablet Commonly known as: EXFORGE Take 1 tablet by mouth daily.   beclomethasone 80 MCG/ACT inhaler Commonly known as: QVAR Inhale 2 puffs into the lungs 2 (two) times daily.   blood glucose meter kit and supplies Accucheck test strips and meter. Use up to four times daily as directed. (FOR ICD-10 E10.9, E11.9).   budesonide 0.5 MG/2ML nebulizer  solution Commonly known as: PULMICORT RINSE SINUSES W/ HALF OF SINUS RINSE BOTTLE THEN MIX ONE RESPULE IN BOTTLE AND RINSE SINUSES W/ REST   Dupixent 300 MG/2ML prefilled syringe Generic drug: dupilumab Inject into the skin.   EPINEPHrine 0.3 mg/0.3 mL Soaj injection Commonly known as: EpiPen 2-Pak Inject 0.3 mLs (0.3 mg total) into the muscle once.   levalbuterol 45 MCG/ACT inhaler Commonly known as: XOPENEX HFA Inhale 2 puffs into the lungs every 6 (six) hours as needed for wheezing.   metFORMIN 500 MG tablet Commonly known as: GLUCOPHAGE TAKE 1 TABLET BY MOUTH TWICE A DAY WITH A MEAL   mometasone 50 MCG/ACT nasal spray Commonly known as: NASONEX Place 1-2 sprays into the nose 2 (two) times daily. Started by: EJiles Prows MD   montelukast 10 MG tablet Commonly known as: SINGULAIR TAKE ONE TABLET ONCE DAILY AS DIRECTED.   ONE TOUCH ULTRA TEST test strip Generic drug: glucose blood PLEASE CHECK BLOOD SUGAR DAILY. DX CODE: E11.9   pantoprazole 40 MG tablet Commonly known as: PROTONIX Take 1 tablet (40 mg total) by mouth daily.   simvastatin 20 MG tablet Commonly known as: ZOCOR TAKE 1 TABLET BY MOUTH EVERY DAY FOR CHOLESTEROL   sterile water (preservative free) injection Inject 10 mLs as directed as directed.       Past Medical History:  Diagnosis Date  . Allergy   . Asthma   . COPD (  chronic obstructive pulmonary disease) ([place])   . Diabetes mellitus without complication ([place])   . Hypertension   . Sleep apnea    uses CPAP    Past Surgical History:  Procedure Laterality Date  . COLONOSCOPY    . DILATION AND CURETTAGE OF UTERUS    . NASAL SINUS SURGERY    . TONSILLECTOMY  1975  . TYMPANOSTOMY TUBE PLACEMENT      Review of systems negative except as noted in HPI / PMHx or noted below:  Review of Systems  Constitutional: Negative.   HENT: Negative.   Eyes: Negative.   Respiratory: Negative.   Cardiovascular: Negative.   Gastrointestinal:  Negative.   Genitourinary: Negative.   Musculoskeletal: Negative.   Skin: Negative.   Neurological: Negative.   Endo/Heme/Allergies: Negative.   Psychiatric/Behavioral: Negative.      Objective:   Vitals:   05/23/19 1104  BP: 112/74  Pulse: 82  Resp: 18  Temp: 97.9 F (36.6 C)  SpO2: 97%          Physical Exam Constitutional:      Appearance: She is not diaphoretic.  HENT:     Head: Normocephalic.     Right Ear: Tympanic membrane and external ear normal.     Left Ear: Tympanic membrane and external ear normal. A foreign body (Tube) is present.     Nose: Nose normal. No mucosal edema or rhinorrhea.     Mouth/Throat:     Pharynx: Uvula midline. No oropharyngeal exudate.  Eyes:     Conjunctiva/sclera: Conjunctivae normal.  Neck:     Thyroid: No thyromegaly.     Trachea: Trachea normal. No tracheal tenderness or tracheal deviation.  Cardiovascular:     Rate and Rhythm: Normal rate and regular rhythm.     Heart sounds: Normal heart sounds, S1 normal and S2 normal. No murmur.  Pulmonary:     Effort: No respiratory distress.     Breath sounds: Normal breath sounds. No stridor. No wheezing or rales.  Lymphadenopathy:     Head:     Right side of head: No tonsillar adenopathy.     Left side of head: No tonsillar adenopathy.     Cervical: No cervical adenopathy.  Skin:    Findings: No erythema or rash.     Nails: There is no clubbing.   Neurological:     Mental Status: She is alert.     Diagnostics:    Spirometry was performed and demonstrated an FEV1 of 1.78 at 74 % of predicted.  The patient had an Asthma Control Test with the following results: ACT Total Score: 25.    Assessment and Plan:   1. Asthma, severe persistent, well-controlled   2. Other allergic rhinitis   3. Chronic pansinusitis   4. Nasal polyposis   5. LPRD (laryngopharyngeal reflux disease)     1. Continue Advair 230 two inhalations 1- 2 time per day depending on disease activity  2.  Continue pantoprazole 40 mg daily  3. Continue nasal mometasone - 1- 2 sprays each nostril 2 times per day  4. Continue montelukast 10 mg tablet 1 time per day  5.  Continue Dupilumab injections    6.  Continue Xopenex HFA if needed  7. Return to clinic in 6 months or earlier if problem  8. Obtain COVID vaccine when available  Sevyn appears to be doing quite well on her current plan and we will see if there is an opportunity to further consolidate her treatment by  decreasing her nasal mometasone by 50%.  She has already decreased her Advair to just 1 time per day.  She will continue on dupilumab and therapy directed against reflux.  I will see her back in this clinic in 6 months or earlier if there is a problem.  Allena Katz, MD Allergy / Immunology [facility]

## 2019-05-24 ENCOUNTER — Encounter: Payer: Self-pay | Admitting: Allergy and Immunology

## 2019-05-30 ENCOUNTER — Other Ambulatory Visit: Payer: Self-pay

## 2019-05-30 ENCOUNTER — Ambulatory Visit (INDEPENDENT_AMBULATORY_CARE_PROVIDER_SITE_OTHER): Payer: 59

## 2019-05-30 DIAGNOSIS — J4550 Severe persistent asthma, uncomplicated: Secondary | ICD-10-CM

## 2019-05-31 ENCOUNTER — Ambulatory Visit: Payer: Self-pay

## 2019-06-13 ENCOUNTER — Other Ambulatory Visit: Payer: Self-pay

## 2019-06-13 ENCOUNTER — Ambulatory Visit (INDEPENDENT_AMBULATORY_CARE_PROVIDER_SITE_OTHER): Payer: 59

## 2019-06-13 DIAGNOSIS — J4550 Severe persistent asthma, uncomplicated: Secondary | ICD-10-CM | POA: Diagnosis not present

## 2019-06-29 ENCOUNTER — Ambulatory Visit: Payer: Self-pay

## 2019-06-30 ENCOUNTER — Other Ambulatory Visit: Payer: Self-pay

## 2019-06-30 ENCOUNTER — Ambulatory Visit (INDEPENDENT_AMBULATORY_CARE_PROVIDER_SITE_OTHER): Payer: 59

## 2019-06-30 ENCOUNTER — Ambulatory Visit: Payer: Self-pay

## 2019-06-30 DIAGNOSIS — J4550 Severe persistent asthma, uncomplicated: Secondary | ICD-10-CM

## 2019-07-06 ENCOUNTER — Ambulatory Visit
Admission: RE | Admit: 2019-07-06 | Discharge: 2019-07-06 | Disposition: A | Discharge status: Home / Self Care | Payer: 59 | Source: Ambulatory Visit | Attending: Family Medicine | Admitting: Family Medicine

## 2019-07-06 ENCOUNTER — Other Ambulatory Visit: Payer: Self-pay

## 2019-07-06 DIAGNOSIS — Z1231 Encounter for screening mammogram for malignant neoplasm of breast: Secondary | ICD-10-CM

## 2019-07-07 ENCOUNTER — Other Ambulatory Visit: Payer: Self-pay | Admitting: Family Medicine

## 2019-07-07 DIAGNOSIS — R928 Other abnormal and inconclusive findings on diagnostic imaging of breast: Secondary | ICD-10-CM

## 2019-07-10 ENCOUNTER — Other Ambulatory Visit: Payer: Self-pay | Admitting: Allergy and Immunology

## 2019-07-11 ENCOUNTER — Other Ambulatory Visit: Payer: Self-pay

## 2019-07-11 ENCOUNTER — Ambulatory Visit: Payer: 59

## 2019-07-11 ENCOUNTER — Ambulatory Visit
Admission: RE | Admit: 2019-07-11 | Discharge: 2019-07-11 | Disposition: A | Discharge status: Home / Self Care | Payer: 59 | Source: Ambulatory Visit | Attending: Family Medicine | Admitting: Family Medicine

## 2019-07-11 DIAGNOSIS — R928 Other abnormal and inconclusive findings on diagnostic imaging of breast: Secondary | ICD-10-CM

## 2019-07-18 ENCOUNTER — Ambulatory Visit (INDEPENDENT_AMBULATORY_CARE_PROVIDER_SITE_OTHER): Payer: 59

## 2019-07-18 ENCOUNTER — Other Ambulatory Visit: Payer: Self-pay

## 2019-07-18 DIAGNOSIS — J4550 Severe persistent asthma, uncomplicated: Secondary | ICD-10-CM

## 2019-08-01 ENCOUNTER — Ambulatory Visit (INDEPENDENT_AMBULATORY_CARE_PROVIDER_SITE_OTHER): Payer: 59

## 2019-08-01 ENCOUNTER — Other Ambulatory Visit: Payer: Self-pay

## 2019-08-01 DIAGNOSIS — J4550 Severe persistent asthma, uncomplicated: Secondary | ICD-10-CM | POA: Diagnosis not present

## 2019-08-15 ENCOUNTER — Ambulatory Visit (INDEPENDENT_AMBULATORY_CARE_PROVIDER_SITE_OTHER): Payer: 59

## 2019-08-15 ENCOUNTER — Other Ambulatory Visit: Payer: Self-pay

## 2019-08-15 DIAGNOSIS — J4550 Severe persistent asthma, uncomplicated: Secondary | ICD-10-CM

## 2019-08-25 ENCOUNTER — Ambulatory Visit: Payer: 59 | Attending: Internal Medicine

## 2019-08-25 DIAGNOSIS — Z23 Encounter for immunization: Secondary | ICD-10-CM | POA: Insufficient documentation

## 2019-08-25 NOTE — Progress Notes (Signed)
   Covid-19 Vaccination Clinic  Name:  Ashley Villarreal    MRN: [id number] DOB: [birth year]  08/25/2019  [name] was observed post Covid-19 immunization for 15 minutes without incident. She was provided with Vaccine Information Sheet and instruction to access the V-Safe system.   [name] was instructed to call 911 with any severe reactions post vaccine: Marland Kitchen Difficulty breathing  . Swelling of face and throat  . A fast heartbeat  . A bad rash all over body  . Dizziness and weakness   Immunizations Administered    Name Date Dose VIS Date Route   Pfizer COVID-19 Vaccine 08/25/2019 12:18 PM 0.3 mL 06/02/2019 Intramuscular   Manufacturer: [place]   Lot: [id number]   [place]: [id number]

## 2019-08-28 ENCOUNTER — Ambulatory Visit: Payer: Self-pay

## 2019-08-29 ENCOUNTER — Other Ambulatory Visit: Payer: Self-pay

## 2019-08-29 ENCOUNTER — Ambulatory Visit: Payer: Self-pay

## 2019-08-29 ENCOUNTER — Ambulatory Visit (INDEPENDENT_AMBULATORY_CARE_PROVIDER_SITE_OTHER): Payer: 59

## 2019-08-29 DIAGNOSIS — J4550 Severe persistent asthma, uncomplicated: Secondary | ICD-10-CM

## 2019-09-12 ENCOUNTER — Ambulatory Visit (INDEPENDENT_AMBULATORY_CARE_PROVIDER_SITE_OTHER): Payer: 59

## 2019-09-12 ENCOUNTER — Other Ambulatory Visit: Payer: Self-pay

## 2019-09-12 DIAGNOSIS — J4550 Severe persistent asthma, uncomplicated: Secondary | ICD-10-CM | POA: Diagnosis not present

## 2019-09-20 ENCOUNTER — Ambulatory Visit: Payer: 59 | Attending: Internal Medicine

## 2019-09-20 DIAGNOSIS — Z23 Encounter for immunization: Secondary | ICD-10-CM

## 2019-09-20 NOTE — Progress Notes (Signed)
   Covid-19 Vaccination Clinic  Name:  Ashley Villarreal    MRN: [id number] DOB: [birth year]  09/20/2019  [name] was observed post Covid-19 immunization for 15 minutes without incident. She was provided with Vaccine Information Sheet and instruction to access the V-Safe system.   [name] was instructed to call 911 with any severe reactions post vaccine: Marland Kitchen Difficulty breathing  . Swelling of face and throat  . A fast heartbeat  . A bad rash all over body  . Dizziness and weakness   Immunizations Administered    Name Date Dose VIS Date Route   Pfizer COVID-19 Vaccine 09/20/2019 12:36 PM 0.3 mL 06/02/2019 Intramuscular   Manufacturer: [place]   Lot: [id number]   [place]: [id number]

## 2019-09-26 ENCOUNTER — Ambulatory Visit (INDEPENDENT_AMBULATORY_CARE_PROVIDER_SITE_OTHER): Payer: 59

## 2019-09-26 ENCOUNTER — Other Ambulatory Visit: Payer: Self-pay

## 2019-09-26 DIAGNOSIS — J4550 Severe persistent asthma, uncomplicated: Secondary | ICD-10-CM | POA: Diagnosis not present

## 2019-10-11 LAB — HM DIABETES EYE EXAM

## 2019-10-12 ENCOUNTER — Ambulatory Visit (INDEPENDENT_AMBULATORY_CARE_PROVIDER_SITE_OTHER): Payer: 59

## 2019-10-12 ENCOUNTER — Other Ambulatory Visit: Payer: Self-pay

## 2019-10-12 DIAGNOSIS — J4550 Severe persistent asthma, uncomplicated: Secondary | ICD-10-CM | POA: Diagnosis not present

## 2019-10-24 ENCOUNTER — Other Ambulatory Visit: Payer: Self-pay

## 2019-10-24 ENCOUNTER — Ambulatory Visit (INDEPENDENT_AMBULATORY_CARE_PROVIDER_SITE_OTHER): Payer: 59

## 2019-10-24 DIAGNOSIS — J4550 Severe persistent asthma, uncomplicated: Secondary | ICD-10-CM

## 2019-10-26 ENCOUNTER — Ambulatory Visit: Payer: Self-pay

## 2019-11-07 ENCOUNTER — Ambulatory Visit (INDEPENDENT_AMBULATORY_CARE_PROVIDER_SITE_OTHER): Payer: 59

## 2019-11-07 ENCOUNTER — Other Ambulatory Visit: Payer: Self-pay

## 2019-11-07 DIAGNOSIS — J4550 Severe persistent asthma, uncomplicated: Secondary | ICD-10-CM

## 2019-11-21 ENCOUNTER — Ambulatory Visit: Payer: 59 | Admitting: Allergy and Immunology

## 2019-11-21 ENCOUNTER — Encounter: Payer: Self-pay | Admitting: Allergy and Immunology

## 2019-11-21 ENCOUNTER — Ambulatory Visit: Payer: Self-pay

## 2019-11-21 ENCOUNTER — Telehealth: Payer: Self-pay | Admitting: *Deleted

## 2019-11-21 ENCOUNTER — Other Ambulatory Visit: Payer: Self-pay

## 2019-11-21 VITALS — HR 90 | Temp 97.6°F | Resp 17 | Ht 66.6 in | Wt 163.4 lb

## 2019-11-21 DIAGNOSIS — J4550 Severe persistent asthma, uncomplicated: Secondary | ICD-10-CM | POA: Diagnosis not present

## 2019-11-21 DIAGNOSIS — J339 Nasal polyp, unspecified: Secondary | ICD-10-CM | POA: Diagnosis not present

## 2019-11-21 DIAGNOSIS — J3089 Other allergic rhinitis: Secondary | ICD-10-CM | POA: Diagnosis not present

## 2019-11-21 DIAGNOSIS — K219 Gastro-esophageal reflux disease without esophagitis: Secondary | ICD-10-CM

## 2019-11-21 DIAGNOSIS — J324 Chronic pansinusitis: Secondary | ICD-10-CM | POA: Diagnosis not present

## 2019-11-21 MED ORDER — PANTOPRAZOLE SODIUM 40 MG PO TBEC: 40.0000 mg | DELAYED_RELEASE_TABLET | Freq: Every day | ORAL | 5 refills | Status: DC

## 2019-11-21 MED ORDER — ADVAIR HFA 115-21 MCG/ACT IN AERO: 2.0000 | INHALATION_SPRAY | Freq: Two times a day (BID) | RESPIRATORY_TRACT | 6 refills | Status: DC

## 2019-11-21 NOTE — Patient Instructions (Addendum)
  1. DECREASE Advair 115 - 2 inhalations 1- 2 time per day depending on disease activity  2. Continue nasal mometasone - 1- 2 sprays each nostril 2 times per day    3. Continue montelukast 10 mg tablet 1 time per day  4.  Continue Dupilumab injections    5.  Continue pantoprazole 40 mg daily  6.  Continue Xopenex HFA if needed  7. Return to clinic in 6 months or earlier if problem

## 2019-11-21 NOTE — Telephone Encounter (Signed)
Patient called to let Dr. Neldon Mc know she does not take Budesonide Sinus Rinse anymore.  She does use Mometsone Nasal spray and Neilmed Sinus Rinse.  Medications were updated and patient informed that Dr. Neldon Mc would be sent message regarding update.

## 2019-11-21 NOTE — Progress Notes (Signed)
[facility]   Follow-up Note  Referring Provider: Darreld Mclean, MD Primary Provider: Darreld Mclean, MD Date of Office Visit: 11/21/2019  Subjective:   Ashley Villarreal (DOB: 1956-05-05) is a 64 y.o. female who returns to the [facility] on 11/21/2019 in re-evaluation of the following:  HPI: Maki returns to this clinic in evaluation of severe asthma and chronic sinusitis with nasal polyposis.  I last saw her in this clinic on 23 May 2019.  Her asthma has been under excellent control.  She can exercise with no problem at all.  She walks 7 miles per day.  She rarely uses a short acting bronchodilator.  She has not required a systemic steroid to treat an exacerbation.  She continues on a combination of Advair 230, montelukast, and dupilumab.  Her upper airways are doing excellent.  She has had no need to use an antibiotic to treat an episode of sinusitis.  She can smell and taste with no problem.  She still has placement of the left ear ventilation tube.  She continues to use nasal mometasone twice a day and montelukast and dupilumab.  Her reflux has been under excellent control and she has had no issues with her throat while using pantoprazole every day.  She has obtained two [place] vaccinations.  Allergies as of 11/21/2019      Reactions   Claritin [loratadine]    dizziness   Doxycycline Nausea Only   Other Other (See Comments)   Magic mouthwash  Patient get blisters on her lips      Medication List      Advair HFA 230-21 MCG/ACT inhaler Generic drug: fluticasone-salmeterol INHALE TWO PUFFS 1-2 TIMES DAILY. RINSE, GARGLE, AND SPIT AFTER USE.   Alphaquin HP 4 % Crea APPLY TO DARK SPOTS DAILY.   amLODipine-valsartan 5-320 MG tablet Commonly known as: EXFORGE Take 1 tablet by mouth daily.   beclomethasone 80 MCG/ACT inhaler Commonly known as: QVAR Inhale 2 puffs into the lungs 2 (two)  times daily.   blood glucose meter kit and supplies Accucheck test strips and meter. Use up to four times daily as directed. (FOR ICD-10 E10.9, E11.9).   budesonide 0.5 MG/2ML nebulizer solution Commonly known as: PULMICORT RINSE SINUSES W/ HALF OF SINUS RINSE BOTTLE THEN MIX ONE RESPULE IN BOTTLE AND RINSE SINUSES W/ REST   Dupixent 300 MG/2ML prefilled syringe Generic drug: dupilumab MAINTENANCE: INJECT 300 MG SUBCUTANEOUSLY EVERY OTHER WEEK   EPINEPHrine 0.3 mg/0.3 mL Soaj injection Commonly known as: EpiPen 2-Pak Inject 0.3 mLs (0.3 mg total) into the muscle once.   levalbuterol 45 MCG/ACT inhaler Commonly known as: XOPENEX HFA Inhale 2 puffs into the lungs every 6 (six) hours as needed for wheezing.   metFORMIN 500 MG tablet Commonly known as: GLUCOPHAGE TAKE 1 TABLET BY MOUTH TWICE A DAY WITH A MEAL   mometasone 50 MCG/ACT nasal spray Commonly known as: NASONEX Place 1-2 sprays into the nose 2 (two) times daily.   montelukast 10 MG tablet Commonly known as: SINGULAIR TAKE ONE TABLET ONCE DAILY AS DIRECTED.   ONE TOUCH ULTRA TEST test strip Generic drug: glucose blood PLEASE CHECK BLOOD SUGAR DAILY. DX CODE: E11.9   pantoprazole 40 MG tablet Commonly known as: PROTONIX Take 1 tablet (40 mg total) by mouth daily.   simvastatin 20 MG tablet Commonly known as: ZOCOR TAKE 1 TABLET BY MOUTH EVERY DAY FOR CHOLESTEROL   sterile water (preservative  free) injection Inject 10 mLs as directed as directed.       Past Medical History:  Diagnosis Date  . Allergy   . Asthma   . COPD (chronic obstructive pulmonary disease) ([place])   . Diabetes mellitus without complication ([place])   . Hypertension   . Sleep apnea    uses CPAP    Past Surgical History:  Procedure Laterality Date  . COLONOSCOPY    . DILATION AND CURETTAGE OF UTERUS    . NASAL SINUS SURGERY    . TONSILLECTOMY  1975  . TYMPANOSTOMY TUBE PLACEMENT      Review of systems negative except as noted in  HPI / PMHx or noted below:  Review of Systems  Constitutional: Negative.   HENT: Negative.   Eyes: Negative.   Respiratory: Negative.   Cardiovascular: Negative.   Gastrointestinal: Negative.   Genitourinary: Negative.   Musculoskeletal: Negative.   Skin: Negative.   Neurological: Negative.   Endo/Heme/Allergies: Negative.   Psychiatric/Behavioral: Negative.      Objective:   Vitals:   11/21/19 1106  Pulse: 90  Resp: 17  Temp: 97.6 F (36.4 C)  SpO2: 96%   Height: 5' 6.6" (169.2 cm)  Weight: 163 lb 6.4 oz (74.1 kg)   Physical Exam Constitutional:      Appearance: She is not diaphoretic.  HENT:     Head: Normocephalic.     Right Ear: Tympanic membrane, ear canal and external ear normal.     Left Ear: Tympanic membrane, ear canal and external ear normal. A PE tube is present.     Nose: Nose normal. No mucosal edema or rhinorrhea.     Mouth/Throat:     Pharynx: Uvula midline. No oropharyngeal exudate.  Eyes:     Conjunctiva/sclera: Conjunctivae normal.  Neck:     Thyroid: No thyromegaly.     Trachea: Trachea normal. No tracheal tenderness or tracheal deviation.  Cardiovascular:     Rate and Rhythm: Normal rate and regular rhythm.     Heart sounds: Normal heart sounds, S1 normal and S2 normal. No murmur.  Pulmonary:     Effort: No respiratory distress.     Breath sounds: Normal breath sounds. No stridor. No wheezing or rales.  Lymphadenopathy:     Head:     Right side of head: No tonsillar adenopathy.     Left side of head: No tonsillar adenopathy.     Cervical: No cervical adenopathy.  Skin:    Findings: No erythema or rash.     Nails: There is no clubbing.  Neurological:     Mental Status: She is alert.     Diagnostics:    Spirometry was performed and demonstrated an FEV1 of 1.79 at 80 % of predicted.  The patient had an Asthma Control Test with the following results: ACT Total Score: 25.    Assessment and Plan:   1. Asthma, severe persistent,  well-controlled   2. Other allergic rhinitis   3. Chronic pansinusitis   4. Nasal polyposis   5. LPRD (laryngopharyngeal reflux disease)     1. DECREASE Advair 115 - 2 inhalations 1- 2 time per day depending on disease activity  2. Continue nasal mometasone - 1- 2 sprays each nostril 2 times per day    3. Continue montelukast 10 mg tablet 1 time per day  4.  Continue Dupilumab injections    5.  Continue pantoprazole 40 mg daily  6.  Continue Xopenex HFA if needed  7.  Return to clinic in 6 months or earlier if problem  Edell appears to be doing very well and I think there is an opportunity to consolidate some of her medical therapy and will have her decrease her dose of inhaled steroid from Advair 230 to Advair 115 and attempt to use this medication only 1 time per day.  She will remain on dupilumab injections which appears to be the intervention that is giving rise to significant improvement for both her upper and lower airway issue.  She will continue to treat her reflux as noted above.  I will see her back in this clinic in 6 months or earlier if there is a problem.  Allena Katz, MD Allergy / Immunology [facility]

## 2019-11-22 ENCOUNTER — Encounter: Payer: Self-pay | Admitting: Allergy and Immunology

## 2019-11-25 NOTE — Patient Instructions (Addendum)
It was great to see you again today, as always.  I will be in touch with your labs as soon as possible and will fax in your insurance forms once your labs return   We can plan for physical in about 6 months Continue to use well padded shoes for exercise

## 2019-11-25 NOTE — Progress Notes (Addendum)
[facility] [address], [place],  [address] [phone]  Date:  11/27/2019   Name:  Ashley Villarreal   DOB:  February 17, 1956   MRN:  [id number]  PCP:  Darreld Mclean, MD    Chief Complaint: Diabetes (6 month follow up)   History of Present Illness:  Ashley Villarreal is a 64 y.o. very pleasant female patient who presents with the following:  Patient here today for 50-monthfollow-up visit History of hypertension, asthma, sleep apnea, diabetes, hyperlipidemia, chronic kidney disease  Last seen by myself in November for physical  Her nephrologist is Dr. CVanita Panda-I am not sure when she last saw them however.  She was seen in March and was released I will request this most recent note Her renal function has been stable, most recent GFR was greater than 60  She is treated also by Dr. [facility]eldon [facility]at [facility].  She has history of severe asthma and chronic sinusitis.  At her most recent allergist visit last week she was noted to be doing very well.  She was walking 7 miles a day with good control of her asthma symptoms She has started on Dupixent injections which has been really helpful for  She has some pain in the bottom of her left foot- this may bother her at night, her feet may ache She does try to wear supportive and comfortable shoes NKI  Due for A1c-most recent labs November Colon cancer screen up-to-date Pap up-to-date COVID-19 done Would like to start shingrix today   Amlodipine/valsartan Metformin 500 twice daily Zocor  Wt Readings from Last 3 Encounters:  11/27/19 157 lb (71.2 kg)  11/21/19 163 lb 6.4 oz (74.1 kg)  05/22/19 159 lb 8 oz (72.3 kg)    Patient Active Problem List   Diagnosis Date Noted   CKD (chronic kidney disease) stage 3, GFR 30-59 ml/min 04/13/2018   Gastroesophageal reflux disease without esophagitis 06/09/2017   Anosmia 06/09/2017   Asthma,  severe persistent, well-controlled 06/12/2015   LPRD (laryngopharyngeal reflux disease) 06/12/2015   Allergic rhinoconjunctivitis 06/12/2015   Allergy with anaphylaxis due to food 06/12/2015   Sleep apnea, obstructive    Hyperlipemia 08/10/2011   Hypertensive heart disease    Diabetes mellitus type 2, noninsulin dependent ([place]     Past Medical History:  Diagnosis Date   Allergy    Asthma    COPD (chronic obstructive pulmonary disease) ([place]    Diabetes mellitus without complication ([place]    Hypertension    Sleep apnea    uses CPAP    Past Surgical History:  Procedure Laterality Date   COLONOSCOPY     DILATION AND CURETTAGE OF UTERUS     NASAL SINUS SURGERY     TONSILLECTOMY  1975   TYMPANOSTOMY TUBE PLACEMENT      Social History   Tobacco Use   Smoking status: Never Smoker   Smokeless tobacco: Never Used  Substance Use Topics   Alcohol use: No    Alcohol/week: 0.0 standard drinks   Drug use: No    Family History  Problem Relation Age of Onset   Stroke Father    Heart disease Father    Diabetes Mother    Heart disease Mother    Stroke Mother    Hyperlipidemia Sister    Hyperlipidemia Brother    Diabetes Paternal Grandmother    Hypertension Brother    Colon cancer  Neg Hx    Allergic rhinitis Neg Hx    Angioedema Neg Hx    Asthma Neg Hx    Atopy Neg Hx    Eczema Neg Hx    Immunodeficiency Neg Hx    Urticaria Neg Hx     Allergies  Allergen Reactions   Claritin [Loratadine]     dizziness   Doxycycline Nausea Only   Other Other (See Comments)    Magic mouthwash  Patient get blisters on her lips    Medication list has been reviewed and updated.  Current Outpatient Medications on File Prior to Visit  Medication Sig Dispense Refill   amLODipine-valsartan (EXFORGE) 5-320 MG tablet Take 1 tablet by mouth daily.     beclomethasone (QVAR) 80 MCG/ACT inhaler Inhale 2 puffs into the lungs 2 (two) times daily. 3  Inhaler 3   blood glucose meter kit and supplies Accucheck test strips and meter. Use up to four times daily as directed. (FOR ICD-10 E10.9, E11.9). 1 each 0   DUPIXENT 300 MG/2ML prefilled syringe MAINTENANCE: INJECT 300 MG SUBCUTANEOUSLY EVERY OTHER WEEK 4 mL 10   EPINEPHrine (EPIPEN 2-PAK) 0.3 mg/0.3 mL IJ SOAJ injection Inject 0.3 mLs (0.3 mg total) into the muscle once. 2 Device 1   fluticasone-salmeterol (ADVAIR HFA) 115-21 MCG/ACT inhaler Inhale 2 puffs into the lungs 2 (two) times daily. 1 Inhaler 6   Hydroquinone-Sunscreens (ALPHAQUIN HP) 4 % CREA APPLY TO DARK SPOTS DAILY.  3   levalbuterol (XOPENEX HFA) 45 MCG/ACT inhaler Inhale 2 puffs into the lungs every 6 (six) hours as needed for wheezing. 1 Inhaler 3   metFORMIN (GLUCOPHAGE) 500 MG tablet TAKE 1 TABLET BY MOUTH TWICE A DAY WITH A MEAL 180 tablet 3   mometasone (NASONEX) 50 MCG/ACT nasal spray Place 1-2 sprays into the nose 2 (two) times daily. 17 g 5   montelukast (SINGULAIR) 10 MG tablet TAKE ONE TABLET ONCE DAILY AS DIRECTED. 90 tablet 5   NASAL SALINE NA Place 1 spray into the nose daily. Neilmed Nasal Saline Rinse to use 1-2 times daily.     ONE TOUCH ULTRA TEST test strip PLEASE CHECK BLOOD SUGAR DAILY. DX CODE: E11.9 300 each 6   pantoprazole (PROTONIX) 40 MG tablet Take 1 tablet (40 mg total) by mouth daily. 30 tablet 5   simvastatin (ZOCOR) 20 MG tablet TAKE 1 TABLET BY MOUTH EVERY DAY FOR CHOLESTEROL 90 tablet 3   Water For Injection Sterile (STERILE WATER, PRESERVATIVE FREE,) injection Inject 10 mLs as directed as directed. 10 mL 11   Current Facility-Administered Medications on File Prior to Visit  Medication Dose Route Frequency Provider Last Rate Last Admin   dupilumab (DUPIXENT) prefilled syringe 300 mg  300 mg Subcutaneous Q14 Days Kozlow, Donnamarie Poag, MD   300 mg at 11/21/19 1057   omalizumab Arvid Right) injection 150 mg  150 mg Subcutaneous Q28 days Jiles Prows, MD   150 mg at 07/25/18 1118    Review  of Systems:  As per HPI- otherwise negative.   Physical Examination: Vitals:   11/27/19 0908  BP: 132/70  Pulse: 69  Resp: 16  Temp: (!) 97.5 F (36.4 C)  SpO2: 99%   Vitals:   11/27/19 0908  Weight: 157 lb (71.2 kg)  Height: 5' 6"  (1.676 m)   Body mass index is 25.34 kg/m. Ideal Body Weight: Weight in (lb) to have BMI = 25: 154.6  GEN: no acute distress.  Normal weight, looks well  HEENT: Atraumatic, Normocephalic.  Ears and Nose: No external deformity. CV: RRR, No M/G/R. No JVD. No thrill. No extra heart sounds. PULM: CTA B, no wheezes, crackles, rhonchi. No retractions. No resp. distress. No accessory muscle use. ABD: S, NT, ND, +BS. No rebound. No HSM. EXTR: No c/c/e PSYCH: Normally interactive. Conversant.  She has a little plantar padding bilaterally.  A bunionette is apparent on the left foot, with callus formation  Assessment and Plan: Controlled type 2 diabetes mellitus with diabetic nephropathy, without long-term current use of insulin ([place]) - Plan: Basic metabolic panel, Hemoglobin A1c  Essential hypertension - Plan: Basic metabolic panel  Hyperlipidemia, unspecified hyperlipidemia type - Plan: Lipid panel  Stage 3a chronic kidney disease - Plan: Basic metabolic panel  Asthma, severe persistent, well-controlled  Immunization due - Plan: Varicella-zoster vaccine IM (Shingrix)  Ashley Villarreal is seen today for a follow-up visit.  We will check her A1c and basic metabolic today to monitor diabetes Blood pressures under good control She is getting plenty of exercise Suspect her foot pain is due to atrophy of soft tissue under the foot.  Recommended well-padded shoes, make sure plenty of room in toe box, gel inserts may be helpful.  I also suggested she consult with podiatry if she would like Gave first dose of Shingrix today  Patient does have a insurance form will need to be completed and faxed once her labs come back This visit occurred during the SARS-CoV-2  public health emergency.  Safety protocols were in place, including screening questions prior to the visit, additional usage of staff PPE, and extensive cleaning of exam room while observing appropriate contact time as indicated for disinfecting solutions.    Signed Lamar Blinks, MD  Received her labs- message to pt and her forms are completed  Results for orders placed or performed in visit on 35/57/32  Basic metabolic panel  Result Value Ref Range   Sodium 138 135 - 145 mEq/L   Potassium 4.0 3.5 - 5.1 mEq/L   Chloride 102 96 - 112 mEq/L   CO2 27 19 - 32 mEq/L   Glucose, Bld 108 (H) 70 - 99 mg/dL   BUN 20 6 - 23 mg/dL   Creatinine, Ser 1.19 0.40 - 1.20 mg/dL   GFR 55.50 (L) >60.00 mL/min   Calcium 10.0 8.4 - 10.5 mg/dL  Hemoglobin A1c  Result Value Ref Range   Hgb A1c MFr Bld 6.7 (H) 4.6 - 6.5 %  Lipid panel  Result Value Ref Range   Cholesterol 229 (H) 0 - 200 mg/dL   Triglycerides 79.0 0.0 - 149.0 mg/dL   HDL 53.10 >39.00 mg/dL   VLDL 15.8 0.0 - 40.0 mg/dL   LDL Cholesterol 161 (H) 0 - 99 mg/dL   Total CHOL/HDL Ratio 4    NonHDL 176.31    The 10-year ASCVD risk score Mikey Bussing DC Jr., et al., 2013) is: 21.4%   Values used to calculate the score:     Age: 54 years     Sex: Female     Is Non-Hispanic African American: Yes     Diabetic: Yes     Tobacco smoker: No     Systolic Blood Pressure: 202 mmHg     Is BP treated: Yes     HDL Cholesterol: 53.1 mg/dL     Total Cholesterol: 229 mg/dL

## 2019-11-27 ENCOUNTER — Other Ambulatory Visit: Payer: Self-pay

## 2019-11-27 ENCOUNTER — Encounter: Payer: Self-pay | Admitting: Family Medicine

## 2019-11-27 ENCOUNTER — Ambulatory Visit: Payer: 59 | Admitting: Family Medicine

## 2019-11-27 VITALS — BP 132/70 | HR 69 | Temp 97.5°F | Resp 16 | Ht 66.0 in | Wt 157.0 lb

## 2019-11-27 DIAGNOSIS — E1121 Type 2 diabetes mellitus with diabetic nephropathy: Secondary | ICD-10-CM

## 2019-11-27 DIAGNOSIS — Z23 Encounter for immunization: Secondary | ICD-10-CM | POA: Diagnosis not present

## 2019-11-27 DIAGNOSIS — N1831 Chronic kidney disease, stage 3a: Secondary | ICD-10-CM | POA: Diagnosis not present

## 2019-11-27 DIAGNOSIS — I10 Essential (primary) hypertension: Secondary | ICD-10-CM

## 2019-11-27 DIAGNOSIS — E785 Hyperlipidemia, unspecified: Secondary | ICD-10-CM | POA: Diagnosis not present

## 2019-11-27 DIAGNOSIS — J4550 Severe persistent asthma, uncomplicated: Secondary | ICD-10-CM

## 2019-11-27 LAB — LIPID PANEL
Cholesterol: 229 mg/dL — ABNORMAL HIGH (ref 0–200)
HDL: 53.1 mg/dL (ref >39.00)
LDL Cholesterol: 161 mg/dL — ABNORMAL HIGH (ref 0–99)
NonHDL: 176.31
Total CHOL/HDL Ratio: 4
Triglycerides: 79 mg/dL (ref 0.0–149.0)
VLDL: 15.8 mg/dL (ref 0.0–40.0)

## 2019-11-27 LAB — BASIC METABOLIC PANEL
BUN: 20 mg/dL (ref 6–23)
CO2: 27 mEq/L (ref 19–32)
Calcium: 10 mg/dL (ref 8.4–10.5)
Chloride: 102 mEq/L (ref 96–112)
Creatinine, Ser: 1.19 mg/dL (ref 0.40–1.20)
GFR: 55.5 mL/min — ABNORMAL LOW (ref >60.00)
Glucose, Bld: 108 mg/dL — ABNORMAL HIGH (ref 70–99)
Potassium: 4 mEq/L (ref 3.5–5.1)
Sodium: 138 mEq/L (ref 135–145)

## 2019-11-27 LAB — HEMOGLOBIN A1C: Hgb A1c MFr Bld: 6.7 % — ABNORMAL HIGH (ref 4.6–6.5)

## 2019-12-05 ENCOUNTER — Ambulatory Visit (INDEPENDENT_AMBULATORY_CARE_PROVIDER_SITE_OTHER): Payer: 59

## 2019-12-05 ENCOUNTER — Other Ambulatory Visit: Payer: Self-pay

## 2019-12-05 DIAGNOSIS — J4550 Severe persistent asthma, uncomplicated: Secondary | ICD-10-CM

## 2019-12-07 ENCOUNTER — Encounter: Payer: Self-pay | Admitting: Family Medicine

## 2019-12-08 ENCOUNTER — Encounter: Payer: Self-pay | Admitting: Internal Medicine

## 2019-12-08 ENCOUNTER — Encounter: Payer: Self-pay | Admitting: Family Medicine

## 2019-12-08 ENCOUNTER — Ambulatory Visit: Payer: 59 | Admitting: Internal Medicine

## 2019-12-08 VITALS — BP 132/62 | HR 87 | Temp 98.3°F | Ht 68.0 in | Wt 159.2 lb

## 2019-12-08 DIAGNOSIS — G4733 Obstructive sleep apnea (adult) (pediatric): Secondary | ICD-10-CM | POA: Diagnosis not present

## 2019-12-08 DIAGNOSIS — J4550 Severe persistent asthma, uncomplicated: Secondary | ICD-10-CM | POA: Diagnosis not present

## 2019-12-08 MED ORDER — ACCU-CHEK SOFTCLIX LANCETS MISC: 12 refills | Status: AC

## 2019-12-08 MED ORDER — ACCU-CHEK GUIDE VI STRP: ORAL_STRIP | 12 refills | Status: DC

## 2019-12-08 NOTE — Patient Instructions (Signed)
Order- DME [facility]   please replace old CPAP machine, change to auto 5-15, mask of choice, humidifier, supplies, AirView/ card    Please call if we can help 

## 2019-12-08 NOTE — Assessment & Plan Note (Signed)
Benefits from CPAP with good compliance and control Plan- replace old machine, change to auto 5-15

## 2019-12-08 NOTE — Progress Notes (Signed)
HPI female never smoker followed for OSA, complicated by HBP, asthma/Xolair (Dr. Neldon Mc) NPSG 02/11/15- AHI 12.5 per hour, CPAP to 9   --------------------------------------------------------------------------------   12/08/2018- Virtual Visit via Telephone Note  History of Present Illness: 64 year old female never smoker followed for OSA, complicated by HBP, asthma/ Dupixent (Dr. Neldon Mc), DM2, GERD, CKD3,  CPAP 8/Advanced Download compliance 97%, AHI 3.1/ hr -----OSA on CPAP; DME: [facility], no complaints, taking Dupixent (started in March 2020) managed by Dr Neldon Mc. Machine is working well- about 63 yrs old.  Observations/Objective: Download 97% compliance, AHI 3.1/ hr  Assessment and Plan: OSA- good compliance and control, benefits plan no changes Asthma- no exacerbation- managed by allergist  Follow Up Instructions: 1 year    Consider change to auto 5-15 when machine replaced next year  ---------------------   12/08/19- 64 year old female never smoker followed for OSA, complicated by HBP, asthma/ Dupixent (Dr. Neldon Mc), DM2, GERD, CKD3,  CPAP 8/[facility] Download compliance 100%, AHI 4.3/ hr Body weight today 159 lbs Had 2 [place] is working very well for asthma- from her allergists. Denies interval medical issues. Doing well with CPAP. Download reviewed.    ROS-see HPI   + = positive Constitutional:    weight loss, night sweats, fevers, chills, fatigue, lassitude. HEENT:    headaches, difficulty swallowing, tooth/dental problems, sore throat,       sneezing, itching, ear ache, + nasal congestion, post nasal drip, snoring CV:    chest pain, orthopnea, PND, swelling in lower extremities, anasarca,                                                      dizziness, palpitations Resp:   shortness of breath with exertion or at rest.                productive cough,   non-productive cough, coughing up of blood.              change in color of mucus.  wheezing.   Skin:    rash  or lesions. GI:  No-   heartburn, indigestion, abdominal pain, nausea, vomiting,  GU: . MS:   joint pain, stiffness, decreased range of motion, back pain. Neuro-     nothing unusual Psych:  change in mood or affect.  depression or anxiety.   memory loss.  OBJ- Physical Exam General- Alert, Oriented, Affect-appropriate, Distress- none acute, normal weight/ trim fit appearing Skin- rash-none, lesions- none, excoriation- none Lymphadenopathy- none Head- atraumatic            Eyes- Gross vision intact, PERRLA, conjunctivae and secretions clear            Ears- Hearing, canals-normal            Nose- + turbinate edema, no-Septal dev, mucus, polyps, erosion, perforation             Throat- Mallampati III-IV , mucosa clear , drainage- none, tonsils- atrophic Neck- flexible , trachea midline, no stridor , thyroid nl, carotid no bruit Chest - symmetrical excursion , unlabored           Heart/CV- RRR , no murmur , no gallop  , no rub, nl s1 s2                           -  JVD- none , edema- none, stasis changes- none, varices- none           Lung- clear to P&A, wheeze- none, cough- none , dullness-none, rub- none           Chest wall-  Abd-  Br/ Gen/ Rectal- Not done, not indicated Extrem- cyanosis- none, clubbing, none, atrophy- none, strength- nl Neuro- grossly intact to observation

## 2019-12-08 NOTE — Assessment & Plan Note (Signed)
Doing very well on Dupixent, managed by her allergists

## 2019-12-18 ENCOUNTER — Telehealth: Payer: Self-pay | Admitting: Internal Medicine

## 2019-12-19 ENCOUNTER — Other Ambulatory Visit: Payer: Self-pay

## 2019-12-19 ENCOUNTER — Ambulatory Visit (INDEPENDENT_AMBULATORY_CARE_PROVIDER_SITE_OTHER): Payer: 59

## 2019-12-19 DIAGNOSIS — J4550 Severe persistent asthma, uncomplicated: Secondary | ICD-10-CM

## 2019-12-19 NOTE — Telephone Encounter (Signed)
Spoke to patient and she has not heard from [facility] regarding her CPAP order. Called Melissa Stenson at [facility] and she is checking on order.

## 2019-12-20 NOTE — Telephone Encounter (Signed)
Pt returning call. States that she can't get new cpap at the moment and wanted to see about getting supply's for current cpap.

## 2019-12-20 NOTE — Telephone Encounter (Signed)
Called Brad at [facility] left vm to check status of cpap order that was sent on 6/21.

## 2019-12-20 NOTE — Telephone Encounter (Signed)
Spoke with patient, she was checking on the status of the CPAP supplies for her current CPAP machine. She is not looking to replace her machine at this time d/t the cost, she is in the process of changing insurance and she thought there would be not charge d/t the recall of the machine.  I advised her to call [phone] and ask if her specific machine is part of the recall so she can find out for sure.  She was appreciative.  I let her know that the [facility] is looking into her CPAP supplies and would follow up with her.  She verbalized understanding.

## 2019-12-21 NOTE — Telephone Encounter (Signed)
Community message received from Ashley Villarreal at [facility] -     Patient was contacted and given details of insurance info on 12/19/19. She ask [place] to hold the order at that time and we have not heard back from her. I will reach out to the referral team and have them contact client for supply order.   I called pt & made her aware of Brad's message and they should be contacting her.  Told her to call us if she needs anything else.  Nothing further needed.

## 2019-12-21 NOTE — Telephone Encounter (Signed)
Did not hear back from [facility] yesterday.  Today have sent community message to Melissa/Leah/Brad/Pam at [facility] to check on status of order that was sent on 6/21.  Told them in message pt just wants supplies & not the cpap.

## 2020-01-02 ENCOUNTER — Ambulatory Visit (INDEPENDENT_AMBULATORY_CARE_PROVIDER_SITE_OTHER): Payer: 59

## 2020-01-02 ENCOUNTER — Other Ambulatory Visit: Payer: Self-pay

## 2020-01-02 DIAGNOSIS — J4550 Severe persistent asthma, uncomplicated: Secondary | ICD-10-CM | POA: Diagnosis not present

## 2020-01-17 ENCOUNTER — Ambulatory Visit (INDEPENDENT_AMBULATORY_CARE_PROVIDER_SITE_OTHER): Payer: 59

## 2020-01-17 ENCOUNTER — Other Ambulatory Visit: Payer: Self-pay

## 2020-01-17 DIAGNOSIS — J4550 Severe persistent asthma, uncomplicated: Secondary | ICD-10-CM | POA: Diagnosis not present

## 2020-01-30 ENCOUNTER — Other Ambulatory Visit: Payer: Self-pay

## 2020-01-30 ENCOUNTER — Ambulatory Visit (INDEPENDENT_AMBULATORY_CARE_PROVIDER_SITE_OTHER): Payer: 59

## 2020-01-30 DIAGNOSIS — J4550 Severe persistent asthma, uncomplicated: Secondary | ICD-10-CM

## 2020-02-13 ENCOUNTER — Ambulatory Visit (INDEPENDENT_AMBULATORY_CARE_PROVIDER_SITE_OTHER): Payer: 59

## 2020-02-13 ENCOUNTER — Other Ambulatory Visit: Payer: Self-pay

## 2020-02-13 DIAGNOSIS — J4550 Severe persistent asthma, uncomplicated: Secondary | ICD-10-CM | POA: Diagnosis not present

## 2020-02-15 ENCOUNTER — Other Ambulatory Visit: Payer: Self-pay

## 2020-02-15 MED ORDER — BECLOMETHASONE DIPROPIONATE 80 MCG/ACT IN AERS: 2.0000 | INHALATION_SPRAY | Freq: Two times a day (BID) | RESPIRATORY_TRACT | 3 refills | Status: DC

## 2020-02-15 NOTE — Telephone Encounter (Signed)
Refill sent and left a message letting patient the Qvar was sent to pharmacy.

## 2020-02-15 NOTE — Telephone Encounter (Signed)
Patient called requesting a new prescription for Qvar 80. Patients prescription has expired. Patient states Dr. Neldon Mc told her to use the Qvar 80 when needed.  Please Advise.  [facility]

## 2020-02-15 NOTE — Telephone Encounter (Signed)
2. Add Qvar 80 two inhalations twice a day during increased asthma activity.

## 2020-02-15 NOTE — Telephone Encounter (Signed)
I did not see any mention of continuing Qvar in last office note. Is it ok to send in refill?

## 2020-02-28 ENCOUNTER — Other Ambulatory Visit: Payer: Self-pay

## 2020-02-28 ENCOUNTER — Ambulatory Visit: Payer: Self-pay

## 2020-02-28 ENCOUNTER — Ambulatory Visit (INDEPENDENT_AMBULATORY_CARE_PROVIDER_SITE_OTHER): Payer: BLUE CROSS/BLUE SHIELD

## 2020-02-28 DIAGNOSIS — J4550 Severe persistent asthma, uncomplicated: Secondary | ICD-10-CM | POA: Diagnosis not present

## 2020-03-12 ENCOUNTER — Other Ambulatory Visit: Payer: Self-pay

## 2020-03-12 ENCOUNTER — Ambulatory Visit (INDEPENDENT_AMBULATORY_CARE_PROVIDER_SITE_OTHER): Payer: BLUE CROSS/BLUE SHIELD | Admitting: *Deleted

## 2020-03-12 DIAGNOSIS — J4550 Severe persistent asthma, uncomplicated: Secondary | ICD-10-CM

## 2020-03-26 ENCOUNTER — Ambulatory Visit (INDEPENDENT_AMBULATORY_CARE_PROVIDER_SITE_OTHER): Payer: BLUE CROSS/BLUE SHIELD | Admitting: *Deleted

## 2020-03-26 ENCOUNTER — Other Ambulatory Visit: Payer: Self-pay

## 2020-03-26 DIAGNOSIS — J4550 Severe persistent asthma, uncomplicated: Secondary | ICD-10-CM

## 2020-04-04 ENCOUNTER — Other Ambulatory Visit: Payer: Self-pay | Admitting: Allergy and Immunology

## 2020-04-04 ENCOUNTER — Other Ambulatory Visit: Payer: Self-pay | Admitting: Family Medicine

## 2020-04-04 DIAGNOSIS — E785 Hyperlipidemia, unspecified: Secondary | ICD-10-CM

## 2020-04-09 ENCOUNTER — Other Ambulatory Visit: Payer: Self-pay

## 2020-04-09 ENCOUNTER — Ambulatory Visit (INDEPENDENT_AMBULATORY_CARE_PROVIDER_SITE_OTHER): Payer: BLUE CROSS/BLUE SHIELD

## 2020-04-09 DIAGNOSIS — J4550 Severe persistent asthma, uncomplicated: Secondary | ICD-10-CM

## 2020-04-16 ENCOUNTER — Other Ambulatory Visit: Payer: Self-pay

## 2020-04-16 ENCOUNTER — Ambulatory Visit (INDEPENDENT_AMBULATORY_CARE_PROVIDER_SITE_OTHER): Payer: BLUE CROSS/BLUE SHIELD

## 2020-04-16 DIAGNOSIS — Z23 Encounter for immunization: Secondary | ICD-10-CM

## 2020-04-20 ENCOUNTER — Ambulatory Visit: Payer: Self-pay | Attending: Internal Medicine

## 2020-04-20 DIAGNOSIS — Z23 Encounter for immunization: Secondary | ICD-10-CM

## 2020-04-20 NOTE — Progress Notes (Signed)
° °  Covid-19 Vaccination Clinic  Name:  Shanitra Phillippi    MRN: [id number] DOB: 1955/12/15  04/20/2020  Ms. Larsson was observed post Covid-19 immunization for 15 minutes without incident. She was provided with Vaccine Information Sheet and instruction to access the V-Safe system.   Ms. Kent was instructed to call 911 with any severe reactions post vaccine:  Difficulty breathing   Swelling of face and throat   A fast heartbeat   A bad rash all over body   Dizziness and weakness

## 2020-04-23 ENCOUNTER — Other Ambulatory Visit: Payer: Self-pay

## 2020-04-23 ENCOUNTER — Ambulatory Visit (INDEPENDENT_AMBULATORY_CARE_PROVIDER_SITE_OTHER): Payer: BLUE CROSS/BLUE SHIELD

## 2020-04-23 DIAGNOSIS — J4550 Severe persistent asthma, uncomplicated: Secondary | ICD-10-CM | POA: Diagnosis not present

## 2020-04-26 ENCOUNTER — Other Ambulatory Visit: Payer: Self-pay | Admitting: Family Medicine

## 2020-04-26 DIAGNOSIS — E1121 Type 2 diabetes mellitus with diabetic nephropathy: Secondary | ICD-10-CM

## 2020-05-07 ENCOUNTER — Other Ambulatory Visit: Payer: Self-pay

## 2020-05-07 ENCOUNTER — Ambulatory Visit (INDEPENDENT_AMBULATORY_CARE_PROVIDER_SITE_OTHER): Payer: BLUE CROSS/BLUE SHIELD

## 2020-05-07 DIAGNOSIS — J4550 Severe persistent asthma, uncomplicated: Secondary | ICD-10-CM | POA: Diagnosis not present

## 2020-05-21 ENCOUNTER — Ambulatory Visit (INDEPENDENT_AMBULATORY_CARE_PROVIDER_SITE_OTHER): Payer: BLUE CROSS/BLUE SHIELD

## 2020-05-21 ENCOUNTER — Other Ambulatory Visit: Payer: Self-pay

## 2020-05-21 DIAGNOSIS — J4550 Severe persistent asthma, uncomplicated: Secondary | ICD-10-CM | POA: Diagnosis not present

## 2020-05-26 NOTE — Patient Instructions (Addendum)
Great to see you again today as always!  I will be in touch with your labs asap Assuming all is well we can plan to visit in 6 months   You got your 2nd dose of shingrix today  Happy holidays!

## 2020-05-26 NOTE — Progress Notes (Addendum)
[facility] [place], [place], [place] [id number] [phone]  Date:  05/29/2020   Name:  Ashley Villarreal   DOB:  1956-02-04   MRN:  [id number]  PCP:  Darreld Mclean, MD    Chief Complaint: Diabetes   History of Present Illness:  Ashley Villarreal is a 64 y.o. very pleasant female patient who presents with the following:  6 month follow-up visit today- History of hypertension, asthma, sleep apnea, diabetes, hyperlipidemia, chronic kidney disease Last seen by myself in June-  She also sees nephrology (Colodonato) and allergy/ asthma (Kozlow)- she is doing immunotherapy  She feels like the allergy shots are really helping her- no asthma attack in some time  Foot exam due today A1c today 2nd dose shingrix- give today  Flu and covid booster done  Labs due today   She does check her glucose at home- am low 100s generally  She also checks her BP once a week- well controlled   She is feeling well, getting regular exercise by walking.      Patient Active Problem List   Diagnosis Date Noted  . CKD (chronic kidney disease) stage 3, GFR 30-59 ml/min (HCC) 04/13/2018  . Gastroesophageal reflux disease without esophagitis 06/09/2017  . Anosmia 06/09/2017  . Asthma, severe persistent, well-controlled 06/12/2015  . LPRD (laryngopharyngeal reflux disease) 06/12/2015  . Allergic rhinoconjunctivitis 06/12/2015  . Allergy with anaphylaxis due to food 06/12/2015  . Sleep apnea, obstructive   . Hyperlipemia 08/10/2011  . Hypertensive heart disease   . Diabetes mellitus type 2, noninsulin dependent ([place])     Past Medical History:  Diagnosis Date  . Allergy   . Asthma   . COPD (chronic obstructive pulmonary disease) ([place])   . Diabetes mellitus without complication ([place])   . Hypertension   . Sleep apnea    uses CPAP    Past Surgical History:  Procedure Laterality Date  . COLONOSCOPY    . DILATION  AND CURETTAGE OF UTERUS    . NASAL SINUS SURGERY    . TONSILLECTOMY  1975  . TYMPANOSTOMY TUBE PLACEMENT      Social History   Tobacco Use  . Smoking status: Never Smoker  . Smokeless tobacco: Never Used  Vaping Use  . Vaping Use: Never used  Substance Use Topics  . Alcohol use: No    Alcohol/week: 0.0 standard drinks  . Drug use: No    Family History  Problem Relation Age of Onset  . Stroke Father   . Heart disease Father   . Diabetes Mother   . Heart disease Mother   . Stroke Mother   . Hyperlipidemia Sister   . Hyperlipidemia Brother   . Diabetes Paternal Grandmother   . Hypertension Brother   . Colon cancer Neg Hx   . Allergic rhinitis Neg Hx   . Angioedema Neg Hx   . Asthma Neg Hx   . Atopy Neg Hx   . Eczema Neg Hx   . Immunodeficiency Neg Hx   . Urticaria Neg Hx     Allergies  Allergen Reactions  . Claritin [Loratadine]     dizziness  . Doxycycline Nausea Only  . Other Other (See Comments)    Magic mouthwash  Patient get blisters on her lips    Medication list has been reviewed and updated.  Current Outpatient Medications on File Prior to Visit  Medication Sig Dispense Refill  . Accu-Chek  Softclix Lancets lancets Check blood sugar once daily 100 each 12  . amLODipine-valsartan (EXFORGE) 5-320 MG tablet Take 1 tablet by mouth daily.    . beclomethasone (QVAR) 80 MCG/ACT inhaler Inhale 2 puffs into the lungs 2 (two) times daily. 8.7 g 3  . blood glucose meter kit and supplies Accucheck test strips and meter. Use up to four times daily as directed. (FOR ICD-10 E10.9, E11.9). 1 each 0  . DUPIXENT 300 MG/2ML prefilled syringe MAINTENANCE: INJECT 300 MG SUBCUTANEOUSLY EVERY OTHER WEEK 4 mL 10  . EPINEPHrine (EPIPEN 2-PAK) 0.3 mg/0.3 mL IJ SOAJ injection Inject 0.3 mLs (0.3 mg total) into the muscle once. 2 Device 1  . fluticasone-salmeterol (ADVAIR HFA) 115-21 MCG/ACT inhaler Inhale 2 puffs into the lungs 2 (two) times daily. 1 Inhaler 6  . glucose blood  (ACCU-CHEK GUIDE) test strip Check blood sugar once daily 50 each 12  . Hydroquinone-Sunscreens (ALPHAQUIN HP) 4 % CREA APPLY TO DARK SPOTS DAILY.  3  . levalbuterol (XOPENEX HFA) 45 MCG/ACT inhaler Inhale 2 puffs into the lungs every 6 (six) hours as needed for wheezing. 1 Inhaler 3  . metFORMIN (GLUCOPHAGE) 500 MG tablet TAKE 1 TABLET BY MOUTH TWICE A DAY WITH MEALS 180 tablet 3  . mometasone (NASONEX) 50 MCG/ACT nasal spray Place 1-2 sprays into the nose 2 (two) times daily. 17 g 5  . montelukast (SINGULAIR) 10 MG tablet TAKE ONE TABLET ONCE DAILY AS DIRECTED. 90 tablet 5  . NASAL SALINE NA Place 1 spray into the nose daily. Neilmed Nasal Saline Rinse to use 1-2 times daily.    . pantoprazole (PROTONIX) 40 MG tablet TAKE 1 TABLET BY MOUTH EVERY DAY 90 tablet 0  . simvastatin (ZOCOR) 20 MG tablet TAKE 1 TABLET BY MOUTH EVERY DAY FOR CHOLESTEROL 90 tablet 3  . Water For Injection Sterile (STERILE WATER, PRESERVATIVE FREE,) injection Inject 10 mLs as directed as directed. 10 mL 11   Current Facility-Administered Medications on File Prior to Visit  Medication Dose Route Frequency Provider Last Rate Last Admin  . dupilumab (DUPIXENT) prefilled syringe 300 mg  300 mg Subcutaneous Q14 Days Kozlow, Donnamarie Poag, MD   300 mg at 05/21/20 1539  . omalizumab Arvid Right) injection 150 mg  150 mg Subcutaneous Q28 days Jiles Prows, MD   150 mg at 07/25/18 1118    Review of Systems: As per HPI- otherwise negative.   Physical Examination: Vitals:   05/29/20 0921  BP: 126/80  Pulse: 82  Resp: 16  SpO2: 98%   Vitals:   05/29/20 0921  Weight: 159 lb (72.1 kg)  Height: $Remove'5\' 8"'ALBvIpI$  (1.727 m)   Body mass index is 24.18 kg/m.   Ideal Body Weight: Weight in (lb) to have BMI = 25: 164.1 GEN: no acute distress.  Looks well, normal weight HEENT: Atraumatic, Normocephalic.   Left TP tube in place, Bilateral TM wnl, oropharynx normal.  PEERL,EOMI.   Ears and Nose: No external deformity. CV: RRR, No M/G/R. No JVD.  No thrill. No extra heart sounds. PULM: CTA B, no wheezes, crackles, rhonchi. No retractions. No resp. distress. No accessory muscle use. ABD: S, NT, ND, +BS. No rebound. No HSM. EXTR: No c/c/e PSYCH: Normally interactive. Conversant.  Foot exam:  Normal today   Assessment and Plan: Controlled type 2 diabetes mellitus with diabetic nephropathy, without long-term current use of insulin (HCC) - Plan: Comprehensive metabolic panel, Hemoglobin A1c  Essential hypertension - Plan: CBC, Comprehensive metabolic panel  Hyperlipidemia, unspecified hyperlipidemia type -  Plan: Lipid panel  Asthma, severe persistent, well-controlled  Stage 3a chronic kidney disease ([place]) - Plan: Comprehensive metabolic panel  Screening for thyroid disorder - Plan: TSH  Fatigue, unspecified type - Plan: TSH, VITAMIN D 25 Hydroxy (Vit-D Deficiency, Fractures)  Immunization due - Plan: Varicella-zoster vaccine IM (Shingrix)  Following up on chronic disease today BP well controlled Asthma improving with allergy shots Shingles #2 today Will plan further follow- up pending labs. Encouraged continued walking for exercise  This visit occurred during the SARS-CoV-2 public health emergency.  Safety protocols were in place, including screening questions prior to the visit, additional usage of staff PPE, and extensive cleaning of exam room while observing appropriate contact time as indicated for disinfecting solutions.    Signed Lamar Blinks, MD  Received her labs as below, message to patient  Results for orders placed or performed in visit on 05/29/20  CBC  Result Value Ref Range   WBC 8.4 4.0 - 10.5 K/uL   RBC 4.35 3.87 - 5.11 Mil/uL   Platelets 247.0 150 - 400 K/uL   Hemoglobin 11.6 (L) 12.0 - 15.0 g/dL   HCT 36.2 36 - 46 %   MCV 83.2 78.0 - 100.0 fl   MCHC 32.0 30.0 - 36.0 g/dL   RDW 15.4 11.5 - 15.5 %  Comprehensive metabolic panel  Result Value Ref Range   Sodium 138 135 - 145 mEq/L   Potassium  3.9 3.5 - 5.1 mEq/L   Chloride 101 96 - 112 mEq/L   CO2 29 19 - 32 mEq/L   Glucose, Bld 81 70 - 99 mg/dL   BUN 15 6 - 23 mg/dL   Creatinine, Ser 1.20 0.40 - 1.20 mg/dL   Total Bilirubin 0.6 0.2 - 1.2 mg/dL   Alkaline Phosphatase 71 39 - 117 U/L   AST 19 0 - 37 U/L   ALT 19 0 - 35 U/L   Total Protein 7.1 6.0 - 8.3 g/dL   Albumin 4.3 3.5 - 5.2 g/dL   GFR 48.30 (L) >60.00 mL/min   Calcium 9.5 8.4 - 10.5 mg/dL  Hemoglobin A1c  Result Value Ref Range   Hgb A1c MFr Bld 6.7 (H) 4.6 - 6.5 %  Lipid panel  Result Value Ref Range   Cholesterol 210 (H) 0 - 200 mg/dL   Triglycerides 74.0 0 - 149 mg/dL   HDL 50.80 >39.00 mg/dL   VLDL 14.8 0.0 - 40.0 mg/dL   LDL Cholesterol 145 (H) 0 - 99 mg/dL   Total CHOL/HDL Ratio 4    NonHDL 159.42   TSH  Result Value Ref Range   TSH 1.00 0.35 - 4.50 uIU/mL  VITAMIN D 25 Hydroxy (Vit-D Deficiency, Fractures)  Result Value Ref Range   VITD 21.79 (L) 30.00 - 100.00 ng/mL   The 10-year ASCVD risk score Mikey Bussing DC Jr., et al., 2013) is: 19%   Values used to calculate the score:     Age: 64 years     Sex: Female     Is Non-Hispanic African American: Yes     Diabetic: Yes     Tobacco smoker: No     Systolic Blood Pressure: 865 mmHg     Is BP treated: Yes     HDL Cholesterol: 50.8 mg/dL     Total Cholesterol: 210 mg/dL

## 2020-05-29 ENCOUNTER — Encounter: Payer: Self-pay | Admitting: Family Medicine

## 2020-05-29 ENCOUNTER — Other Ambulatory Visit: Payer: Self-pay

## 2020-05-29 ENCOUNTER — Ambulatory Visit: Payer: BLUE CROSS/BLUE SHIELD | Admitting: Family Medicine

## 2020-05-29 VITALS — BP 126/80 | HR 82 | Resp 16 | Ht 68.0 in | Wt 159.0 lb

## 2020-05-29 DIAGNOSIS — Z23 Encounter for immunization: Secondary | ICD-10-CM

## 2020-05-29 DIAGNOSIS — R5383 Other fatigue: Secondary | ICD-10-CM | POA: Diagnosis not present

## 2020-05-29 DIAGNOSIS — E1121 Type 2 diabetes mellitus with diabetic nephropathy: Secondary | ICD-10-CM

## 2020-05-29 DIAGNOSIS — Z1329 Encounter for screening for other suspected endocrine disorder: Secondary | ICD-10-CM | POA: Diagnosis not present

## 2020-05-29 DIAGNOSIS — E785 Hyperlipidemia, unspecified: Secondary | ICD-10-CM | POA: Diagnosis not present

## 2020-05-29 DIAGNOSIS — J4550 Severe persistent asthma, uncomplicated: Secondary | ICD-10-CM

## 2020-05-29 DIAGNOSIS — N1831 Chronic kidney disease, stage 3a: Secondary | ICD-10-CM | POA: Diagnosis not present

## 2020-05-29 DIAGNOSIS — I10 Essential (primary) hypertension: Secondary | ICD-10-CM

## 2020-05-29 DIAGNOSIS — E559 Vitamin D deficiency, unspecified: Secondary | ICD-10-CM

## 2020-05-29 LAB — LIPID PANEL
Cholesterol: 210 mg/dL — ABNORMAL HIGH (ref 0–200)
HDL: 50.8 mg/dL (ref >39.00)
LDL Cholesterol: 145 mg/dL — ABNORMAL HIGH (ref 0–99)
NonHDL: 159.42
Total CHOL/HDL Ratio: 4
Triglycerides: 74 mg/dL (ref 0.0–149.0)
VLDL: 14.8 mg/dL (ref 0.0–40.0)

## 2020-05-29 LAB — CBC
HCT: 36.2 % (ref 36.0–46.0)
Hemoglobin: 11.6 g/dL — ABNORMAL LOW (ref 12.0–15.0)
MCHC: 32 g/dL (ref 30.0–36.0)
MCV: 83.2 fl (ref 78.0–100.0)
Platelets: 247 10*3/uL (ref 150.0–400.0)
RBC: 4.35 Mil/uL (ref 3.87–5.11)
RDW: 15.4 % (ref 11.5–15.5)
WBC: 8.4 10*3/uL (ref 4.0–10.5)

## 2020-05-29 LAB — COMPREHENSIVE METABOLIC PANEL
ALT: 19 U/L (ref 0–35)
AST: 19 U/L (ref 0–37)
Albumin: 4.3 g/dL (ref 3.5–5.2)
Alkaline Phosphatase: 71 U/L (ref 39–117)
BUN: 15 mg/dL (ref 6–23)
CO2: 29 mEq/L (ref 19–32)
Calcium: 9.5 mg/dL (ref 8.4–10.5)
Chloride: 101 mEq/L (ref 96–112)
Creatinine, Ser: 1.2 mg/dL (ref 0.40–1.20)
GFR: 48.3 mL/min — ABNORMAL LOW (ref >60.00)
Glucose, Bld: 81 mg/dL (ref 70–99)
Potassium: 3.9 mEq/L (ref 3.5–5.1)
Sodium: 138 mEq/L (ref 135–145)
Total Bilirubin: 0.6 mg/dL (ref 0.2–1.2)
Total Protein: 7.1 g/dL (ref 6.0–8.3)

## 2020-05-29 LAB — TSH: TSH: 1 u[IU]/mL (ref 0.35–4.50)

## 2020-05-29 LAB — HEMOGLOBIN A1C: Hgb A1c MFr Bld: 6.7 % — ABNORMAL HIGH (ref 4.6–6.5)

## 2020-05-29 LAB — VITAMIN D 25 HYDROXY (VIT D DEFICIENCY, FRACTURES): VITD: 21.79 ng/mL — ABNORMAL LOW (ref 30.00–100.00)

## 2020-05-29 MED ORDER — VITAMIN D3 1.25 MG (50000 UT) PO CAPS: ORAL_CAPSULE | ORAL | 0 refills | Status: AC

## 2020-05-29 NOTE — Addendum Note (Signed)
Addended by: Lamar Blinks C on: 05/29/2020 08:27 PM   Modules accepted: Orders

## 2020-06-03 MED ORDER — ROSUVASTATIN CALCIUM 20 MG PO TABS: 20.0000 mg | ORAL_TABLET | Freq: Every day | ORAL | 3 refills | Status: DC

## 2020-06-04 ENCOUNTER — Encounter: Payer: Self-pay | Admitting: Allergy and Immunology

## 2020-06-04 ENCOUNTER — Ambulatory Visit (INDEPENDENT_AMBULATORY_CARE_PROVIDER_SITE_OTHER): Payer: BLUE CROSS/BLUE SHIELD | Admitting: *Deleted

## 2020-06-04 ENCOUNTER — Ambulatory Visit: Payer: BLUE CROSS/BLUE SHIELD | Admitting: Allergy and Immunology

## 2020-06-04 ENCOUNTER — Other Ambulatory Visit: Payer: Self-pay

## 2020-06-04 VITALS — BP 128/74 | HR 69 | Resp 18 | Ht 59.0 in | Wt 162.6 lb

## 2020-06-04 DIAGNOSIS — J339 Nasal polyp, unspecified: Secondary | ICD-10-CM | POA: Diagnosis not present

## 2020-06-04 DIAGNOSIS — J4550 Severe persistent asthma, uncomplicated: Secondary | ICD-10-CM

## 2020-06-04 DIAGNOSIS — J324 Chronic pansinusitis: Secondary | ICD-10-CM

## 2020-06-04 DIAGNOSIS — J3089 Other allergic rhinitis: Secondary | ICD-10-CM

## 2020-06-04 NOTE — Patient Instructions (Signed)
  1.  Attempt to discontinue Advair use.  Can restart Advair 115 - 2 inhalations 1- 2 time per day if increased asthma activity  2. Continue nasal mometasone - 1- 2 sprays each nostril 2 times per day    3. Continue montelukast 10 mg tablet 1 time per day  4.  Continue Dupilumab injections    5.  Continue pantoprazole 40 mg daily  6.  Continue Xopenex HFA if needed  7. Return to clinic in 6 months or earlier if problem

## 2020-06-04 NOTE — Progress Notes (Signed)
[facility]   Follow-up Note  Referring Provider: Darreld Mclean, MD Primary Provider: Darreld Mclean, MD Date of Office Visit: 06/04/2020  Subjective:   Ashley Villarreal (DOB: February 21, 1956) is a 64 y.o. female who returns to the [facility] on 06/04/2020 in re-evaluation of the following:  HPI: Ashley Villarreal returns to this clinic in evaluation of severe asthma and chronic sinusitis with a history of nasal polyposis and reflux.  Her last visit to this clinic was 21 November 2019.  She once again has done excellent during this past interval of time without any significant asthma issues or any respiratory tract symptoms at all.  She has not required a systemic steroid or an antibiotic to address any type of airway issue.  She rarely uses a short acting bronchodilator and she can exercise.  She can smell and taste with no problem.  She has been consistently using her Advair 1 time per day and her nasal mometasone twice a day along with montelukast and continues on dupilumab twice a day.  Reflux is under excellent control with pantoprazole.  She has received 3 Pfizer Covid vaccines and the flu vaccine this year.  She will be having removal of her ear ventilation tube by Dr. Redmond Baseman in April 2022.  Allergies as of 06/04/2020      Reactions   Claritin [loratadine]    dizziness   Doxycycline Nausea Only   Other Other (See Comments)   Magic mouthwash  Patient get blisters on her lips      Medication List    Accu-Chek Guide test strip Generic drug: glucose blood Check blood sugar once daily   Accu-Chek Softclix Lancets lancets Check blood sugar once daily   Advair HFA 115-21 MCG/ACT inhaler Generic drug: fluticasone-salmeterol Inhale 2 puffs into the lungs 2 (two) times daily.   Alphaquin HP 4 % Crea APPLY TO DARK SPOTS DAILY.   amLODipine-valsartan 5-320 MG tablet Commonly known as: EXFORGE Take 1 tablet by mouth  daily.   blood glucose meter kit and supplies Accucheck test strips and meter. Use up to four times daily as directed. (FOR ICD-10 E10.9, E11.9).   Dupixent 300 MG/2ML prefilled syringe Generic drug: dupilumab MAINTENANCE: INJECT 300 MG SUBCUTANEOUSLY EVERY OTHER WEEK   EPINEPHrine 0.3 mg/0.3 mL Soaj injection Commonly known as: EpiPen 2-Pak Inject 0.3 mLs (0.3 mg total) into the muscle once.   levalbuterol 45 MCG/ACT inhaler Commonly known as: XOPENEX HFA Inhale 2 puffs into the lungs every 6 (six) hours as needed for wheezing.   metFORMIN 500 MG tablet Commonly known as: GLUCOPHAGE TAKE 1 TABLET BY MOUTH TWICE A DAY WITH MEALS   mometasone 50 MCG/ACT nasal spray Commonly known as: NASONEX Place 1-2 sprays into the nose 2 (two) times daily.   montelukast 10 MG tablet Commonly known as: SINGULAIR TAKE ONE TABLET ONCE DAILY AS DIRECTED.   NASAL SALINE NA Place 1 spray into the nose daily. Neilmed Nasal Saline Rinse to use 1-2 times daily.   pantoprazole 40 MG tablet Commonly known as: PROTONIX TAKE 1 TABLET BY MOUTH EVERY DAY   rosuvastatin 20 MG tablet Commonly known as: Crestor Take 1 tablet (20 mg total) by mouth daily.   sterile water (preservative free) injection Inject 10 mLs as directed as directed.   Vitamin D3 1.25 MG (50000 UT) Caps Take 1 weekly for 12 weeks       Past Medical History:  Diagnosis Date  .  Allergy   . Asthma   . COPD (chronic obstructive pulmonary disease) ([place])   . Diabetes mellitus without complication ([place])   . Hypertension   . Sleep apnea    uses CPAP    Past Surgical History:  Procedure Laterality Date  . COLONOSCOPY    . DILATION AND CURETTAGE OF UTERUS    . NASAL SINUS SURGERY    . TONSILLECTOMY  1975  . TYMPANOSTOMY TUBE PLACEMENT      Review of systems negative except as noted in HPI / PMHx or noted below:  Review of Systems  Constitutional: Negative.   HENT: Negative.   Eyes: Negative.   Respiratory:  Negative.   Cardiovascular: Negative.   Gastrointestinal: Negative.   Genitourinary: Negative.   Musculoskeletal: Negative.   Skin: Negative.   Neurological: Negative.   Endo/Heme/Allergies: Negative.   Psychiatric/Behavioral: Negative.      Objective:   Vitals:   06/04/20 1044  BP: 128/74  Pulse: 69  Resp: 18  SpO2: 100%   Height: 4' 11"  (149.9 cm)  Weight: 162 lb 9.6 oz (73.8 kg)   Physical Exam Constitutional:      Appearance: She is not diaphoretic.  HENT:     Head: Normocephalic.     Right Ear: Tympanic membrane, ear canal and external ear normal.     Left Ear: Tympanic membrane, ear canal and external ear normal. A PE tube is present.     Nose: Nose normal. No mucosal edema or rhinorrhea.     Mouth/Throat:     Mouth: Oropharynx is clear and moist and mucous membranes are normal.     Pharynx: Uvula midline. No oropharyngeal exudate.  Eyes:     Conjunctiva/sclera: Conjunctivae normal.  Neck:     Thyroid: No thyromegaly.     Trachea: Trachea normal. No tracheal tenderness or tracheal deviation.  Cardiovascular:     Rate and Rhythm: Normal rate and regular rhythm.     Heart sounds: Normal heart sounds, S1 normal and S2 normal. No murmur heard.   Pulmonary:     Effort: No respiratory distress.     Breath sounds: Normal breath sounds. No stridor. No wheezing or rales.  Musculoskeletal:        General: No edema.  Lymphadenopathy:     Head:     Right side of head: No tonsillar adenopathy.     Left side of head: No tonsillar adenopathy.     Cervical: No cervical adenopathy.  Skin:    Findings: No erythema or rash.     Nails: There is no clubbing.  Neurological:     Mental Status: She is alert.     Diagnostics:    Spirometry was performed and demonstrated an FEV1 of 1.86 at 117 % of predicted.   Assessment and Plan:   1. Asthma, severe persistent, well-controlled   2. Other allergic rhinitis   3. Chronic pansinusitis   4. Nasal polyposis   5.  Severe persistent asthma without complication      1.  Attempt to discontinue Advair use.  Can restart Advair 115 - 2 inhalations 1- 2 time per day if increased asthma activity  2. Continue nasal mometasone - 1- 2 sprays each nostril 2 times per day    3. Continue montelukast 10 mg tablet 1 time per day  4.  Continue Dupilumab injections    5.  Continue pantoprazole 40 mg daily  6.  Continue Xopenex HFA if needed  7. Return to clinic in 6  months or earlier if problem  Lilianna continues to do very well with the use of dupilumab which has really changed her respiratory phenotype dramatically.  I think there is an opportunity to further consolidate her medical treatment and she will attempt to discontinue her Advair use.  Assuming she does well with this plan I will see her back in his clinic in 6 months or earlier if there is a problem.  Allena Katz, MD Allergy / Immunology [place]

## 2020-06-05 ENCOUNTER — Encounter: Payer: Self-pay | Admitting: Allergy and Immunology

## 2020-06-05 ENCOUNTER — Other Ambulatory Visit: Payer: Self-pay | Admitting: Allergy and Immunology

## 2020-06-06 ENCOUNTER — Other Ambulatory Visit: Payer: Self-pay | Admitting: Family Medicine

## 2020-06-06 DIAGNOSIS — Z1231 Encounter for screening mammogram for malignant neoplasm of breast: Secondary | ICD-10-CM

## 2020-06-17 ENCOUNTER — Other Ambulatory Visit: Payer: Self-pay | Admitting: Allergy and Immunology

## 2020-06-17 DIAGNOSIS — J4550 Severe persistent asthma, uncomplicated: Secondary | ICD-10-CM

## 2020-06-18 ENCOUNTER — Other Ambulatory Visit: Payer: Self-pay | Admitting: Allergy and Immunology

## 2020-06-19 ENCOUNTER — Ambulatory Visit (INDEPENDENT_AMBULATORY_CARE_PROVIDER_SITE_OTHER): Payer: BLUE CROSS/BLUE SHIELD

## 2020-06-19 DIAGNOSIS — J4550 Severe persistent asthma, uncomplicated: Secondary | ICD-10-CM

## 2020-07-02 ENCOUNTER — Ambulatory Visit (INDEPENDENT_AMBULATORY_CARE_PROVIDER_SITE_OTHER): Payer: BLUE CROSS/BLUE SHIELD

## 2020-07-02 ENCOUNTER — Other Ambulatory Visit: Payer: Self-pay

## 2020-07-02 DIAGNOSIS — J4550 Severe persistent asthma, uncomplicated: Secondary | ICD-10-CM | POA: Diagnosis not present

## 2020-07-16 ENCOUNTER — Other Ambulatory Visit: Payer: Self-pay

## 2020-07-16 ENCOUNTER — Ambulatory Visit (INDEPENDENT_AMBULATORY_CARE_PROVIDER_SITE_OTHER): Payer: BLUE CROSS/BLUE SHIELD

## 2020-07-16 DIAGNOSIS — J4550 Severe persistent asthma, uncomplicated: Secondary | ICD-10-CM

## 2020-07-18 ENCOUNTER — Other Ambulatory Visit: Payer: Self-pay

## 2020-07-18 ENCOUNTER — Ambulatory Visit
Admission: RE | Admit: 2020-07-18 | Discharge: 2020-07-18 | Disposition: A | Discharge status: Home / Self Care | Payer: BLUE CROSS/BLUE SHIELD | Source: Ambulatory Visit | Attending: Family Medicine | Admitting: Family Medicine

## 2020-07-18 DIAGNOSIS — Z1231 Encounter for screening mammogram for malignant neoplasm of breast: Secondary | ICD-10-CM

## 2020-07-30 ENCOUNTER — Ambulatory Visit (INDEPENDENT_AMBULATORY_CARE_PROVIDER_SITE_OTHER): Payer: BLUE CROSS/BLUE SHIELD | Admitting: *Deleted

## 2020-07-30 ENCOUNTER — Other Ambulatory Visit: Payer: Self-pay

## 2020-07-30 DIAGNOSIS — J4550 Severe persistent asthma, uncomplicated: Secondary | ICD-10-CM | POA: Diagnosis not present

## 2020-08-13 ENCOUNTER — Other Ambulatory Visit: Payer: Self-pay

## 2020-08-13 ENCOUNTER — Ambulatory Visit (INDEPENDENT_AMBULATORY_CARE_PROVIDER_SITE_OTHER): Payer: BLUE CROSS/BLUE SHIELD

## 2020-08-13 DIAGNOSIS — J4550 Severe persistent asthma, uncomplicated: Secondary | ICD-10-CM

## 2020-08-14 ENCOUNTER — Other Ambulatory Visit: Payer: Self-pay | Admitting: Family Medicine

## 2020-08-14 DIAGNOSIS — E559 Vitamin D deficiency, unspecified: Secondary | ICD-10-CM

## 2020-08-27 ENCOUNTER — Other Ambulatory Visit: Payer: Self-pay

## 2020-08-27 ENCOUNTER — Ambulatory Visit (INDEPENDENT_AMBULATORY_CARE_PROVIDER_SITE_OTHER): Payer: BLUE CROSS/BLUE SHIELD | Admitting: *Deleted

## 2020-08-27 DIAGNOSIS — J4550 Severe persistent asthma, uncomplicated: Secondary | ICD-10-CM | POA: Diagnosis not present

## 2020-09-10 ENCOUNTER — Other Ambulatory Visit: Payer: Self-pay

## 2020-09-10 ENCOUNTER — Ambulatory Visit (INDEPENDENT_AMBULATORY_CARE_PROVIDER_SITE_OTHER): Payer: BLUE CROSS/BLUE SHIELD | Admitting: *Deleted

## 2020-09-10 DIAGNOSIS — J4550 Severe persistent asthma, uncomplicated: Secondary | ICD-10-CM | POA: Diagnosis not present

## 2020-09-24 ENCOUNTER — Ambulatory Visit (INDEPENDENT_AMBULATORY_CARE_PROVIDER_SITE_OTHER): Payer: BLUE CROSS/BLUE SHIELD | Admitting: *Deleted

## 2020-09-24 ENCOUNTER — Other Ambulatory Visit: Payer: Self-pay

## 2020-09-24 DIAGNOSIS — J4550 Severe persistent asthma, uncomplicated: Secondary | ICD-10-CM | POA: Diagnosis not present

## 2020-10-08 ENCOUNTER — Other Ambulatory Visit: Payer: Self-pay

## 2020-10-08 ENCOUNTER — Ambulatory Visit (INDEPENDENT_AMBULATORY_CARE_PROVIDER_SITE_OTHER): Payer: BLUE CROSS/BLUE SHIELD | Admitting: *Deleted

## 2020-10-08 DIAGNOSIS — J4540 Moderate persistent asthma, uncomplicated: Secondary | ICD-10-CM | POA: Diagnosis not present

## 2020-10-08 DIAGNOSIS — J4550 Severe persistent asthma, uncomplicated: Secondary | ICD-10-CM

## 2020-10-22 ENCOUNTER — Ambulatory Visit (INDEPENDENT_AMBULATORY_CARE_PROVIDER_SITE_OTHER): Payer: BLUE CROSS/BLUE SHIELD | Admitting: *Deleted

## 2020-10-22 ENCOUNTER — Other Ambulatory Visit: Payer: Self-pay

## 2020-10-22 DIAGNOSIS — J4550 Severe persistent asthma, uncomplicated: Secondary | ICD-10-CM

## 2020-11-05 ENCOUNTER — Ambulatory Visit (INDEPENDENT_AMBULATORY_CARE_PROVIDER_SITE_OTHER): Payer: BLUE CROSS/BLUE SHIELD | Admitting: *Deleted

## 2020-11-05 ENCOUNTER — Other Ambulatory Visit: Payer: Self-pay

## 2020-11-05 DIAGNOSIS — J4550 Severe persistent asthma, uncomplicated: Secondary | ICD-10-CM | POA: Diagnosis not present

## 2020-11-21 ENCOUNTER — Other Ambulatory Visit: Payer: Self-pay

## 2020-11-21 ENCOUNTER — Ambulatory Visit (INDEPENDENT_AMBULATORY_CARE_PROVIDER_SITE_OTHER): Payer: BLUE CROSS/BLUE SHIELD | Admitting: *Deleted

## 2020-11-21 DIAGNOSIS — J4550 Severe persistent asthma, uncomplicated: Secondary | ICD-10-CM | POA: Diagnosis not present

## 2020-11-24 NOTE — Progress Notes (Addendum)
[facility] [place], [place], [place] [address] [phone]  Date:  11/27/2020   Name:  Ashley Villarreal   DOB:  1956-05-31   MRN:  [id number]  PCP:  Darreld Mclean, MD    Chief Complaint: Diabetes (Discuss metformin dose and quantity) and Hyperlipidemia (6 month follow up)   History of Present Illness:  Ashley Villarreal is a 65 y.o. very pleasant female patient who presents with the following:  Here today for 39-month follow-up visit- most recent visit with myself was in December  History of hypertension, asthma, sleep apnea, well controlled diabetes, hyperlipidemia, chronic kidney disease, vitamin D deficiency She also sees nephrology (Colodonato) and allergy/ asthma (Kozlow)- she is doing immunotherapy  She notes that the allergy shots seem to be working and she is not having any asthma sx   Eye exam- this was done last month  She did have a pneumonia vaccine in 2009- we will update today  Most recent labs from [facility] vitamin D.  She is now taking vitamin D 2000 iu daily  COVID fourth dose- recommended she do at her convenience  Lab Results  Component Value Date   HGBA1C 6.7 (H) 05/29/2020    She was released by nephrology She is checking her blood sugar about once a week- advised that she can check just if not feeling well going forward   She is exercising on a regular basis- 5 miles a day interspersed during the day   Patient Active Problem List   Diagnosis Date Noted  . CKD (chronic kidney disease) stage 3, GFR 30-59 ml/min (HCC) 04/13/2018  . Gastroesophageal reflux disease without esophagitis 06/09/2017  . Anosmia 06/09/2017  . Asthma, severe persistent, well-controlled 06/12/2015  . LPRD (laryngopharyngeal reflux disease) 06/12/2015  . Allergic rhinoconjunctivitis 06/12/2015  . Allergy with anaphylaxis due to food 06/12/2015  . Sleep apnea, obstructive   . Hyperlipemia  08/10/2011  . Hypertensive heart disease   . Diabetes mellitus type 2, noninsulin dependent ([place])     Past Medical History:  Diagnosis Date  . Allergy   . Asthma   . COPD (chronic obstructive pulmonary disease) ([place])   . Diabetes mellitus without complication ([place])   . Hypertension   . Sleep apnea    uses CPAP    Past Surgical History:  Procedure Laterality Date  . COLONOSCOPY    . DILATION AND CURETTAGE OF UTERUS    . NASAL SINUS SURGERY    . TONSILLECTOMY  1975  . TYMPANOSTOMY TUBE PLACEMENT      Social History   Tobacco Use  . Smoking status: Never Smoker  . Smokeless tobacco: Never Used  Vaping Use  . Vaping Use: Never used  Substance Use Topics  . Alcohol use: No    Alcohol/week: 0.0 standard drinks  . Drug use: No    Family History  Problem Relation Age of Onset  . Stroke Father   . Heart disease Father   . Diabetes Mother   . Heart disease Mother   . Stroke Mother   . Hyperlipidemia Sister   . Hyperlipidemia Brother   . Diabetes Paternal Grandmother   . Hypertension Brother   . Colon cancer Neg Hx   . Allergic rhinitis Neg Hx   . Angioedema Neg Hx   . Asthma Neg Hx   . Atopy Neg Hx   . Eczema Neg Hx   . Immunodeficiency Neg Hx   .  Urticaria Neg Hx     Allergies  Allergen Reactions  . Claritin [Loratadine]     dizziness  . Doxycycline Nausea Only  . Other Other (See Comments)    Magic mouthwash  Patient get blisters on her lips    Medication list has been reviewed and updated.  Current Outpatient Medications on File Prior to Visit  Medication Sig Dispense Refill  . Accu-Chek Softclix Lancets lancets Check blood sugar once daily 100 each 12  . amLODipine-valsartan (EXFORGE) 5-320 MG tablet Take 1 tablet by mouth daily.    . blood glucose meter kit and supplies Accucheck test strips and meter. Use up to four times daily as directed. (FOR ICD-10 E10.9, E11.9). 1 each 0  . Cholecalciferol (VITAMIN D3) 1.25 MG (50000 UT) CAPS Take 1 weekly  for 12 weeks 12 capsule 0  . DUPIXENT 300 MG/2ML prefilled syringe INJECT 1 SYRINGE UNDER THE SKIN EVERY OTHER WEEK 4 mL 10  . EPINEPHrine (EPIPEN 2-PAK) 0.3 mg/0.3 mL IJ SOAJ injection Inject 0.3 mLs (0.3 mg total) into the muscle once. 2 Device 1  . fluticasone-salmeterol (ADVAIR HFA) 115-21 MCG/ACT inhaler Inhale 2 puffs into the lungs 2 (two) times daily. 1 Inhaler 6  . Hydroquinone-Sunscreens (ALPHAQUIN HP) 4 % CREA APPLY TO DARK SPOTS DAILY.  3  . levalbuterol (XOPENEX HFA) 45 MCG/ACT inhaler TAKE 2 PUFFS BY MOUTH EVERY 6 HOURS AS NEEDED FOR WHEEZE 15 each 3  . mometasone (NASONEX) 50 MCG/ACT nasal spray Place 1-2 sprays into the nose 2 (two) times daily. 17 g 5  . montelukast (SINGULAIR) 10 MG tablet TAKE ONE TABLET ONCE DAILY AS DIRECTED. 90 tablet 1  . NASAL SALINE NA Place 1 spray into the nose daily. Neilmed Nasal Saline Rinse to use 1-2 times daily.    . pantoprazole (PROTONIX) 40 MG tablet TAKE 1 TABLET BY MOUTH EVERY DAY 90 tablet 1  . rosuvastatin (CRESTOR) 20 MG tablet Take 1 tablet (20 mg total) by mouth daily. 90 tablet 3  . Water For Injection Sterile (STERILE WATER, PRESERVATIVE FREE,) injection Inject 10 mLs as directed as directed. 10 mL 11   Current Facility-Administered Medications on File Prior to Visit  Medication Dose Route Frequency Provider Last Rate Last Admin  . dupilumab (DUPIXENT) prefilled syringe 300 mg  300 mg Subcutaneous Q14 Days Kozlow, Donnamarie Poag, MD   300 mg at 11/21/20 1032  . omalizumab Arvid Right) injection 150 mg  150 mg Subcutaneous Q28 days Jiles Prows, MD   150 mg at 07/25/18 1118    Review of Systems:  As per HPI- otherwise negative.   Physical Examination: Vitals:   11/27/20 0855  BP: 122/82  Pulse: 71  Resp: 17  Temp: 97.9 F (36.6 C)  SpO2: 98%   Vitals:   11/27/20 0855  Weight: 153 lb (69.4 kg)  Height: 4' 11"  (1.499 m)   Body mass index is 30.9 kg/m. Ideal Body Weight: Weight in (lb) to have BMI = 25: 123.5  GEN: no acute  distress. Overweight, looks well  HEENT: Atraumatic, Normocephalic.  Ears and Nose: No external deformity. CV: RRR, No M/G/R. No JVD. No thrill. No extra heart sounds. PULM: CTA B, no wheezes, crackles, rhonchi. No retractions. No resp. distress. No accessory muscle use. ABD: S, NT, ND, +BS. No rebound. No HSM. EXTR: No c/c/e PSYCH: Normally interactive. Conversant.    Assessment and Plan: Controlled type 2 diabetes mellitus with diabetic nephropathy, without long-term current use of insulin ([place]) - Plan: Hemoglobin A1c,  metFORMIN (GLUCOPHAGE) 500 MG tablet, glucose blood (ACCU-CHEK GUIDE) test strip  Essential hypertension - Plan: CBC, Basic metabolic panel  Vitamin D deficiency - Plan: VITAMIN D 25 Hydroxy (Vit-D Deficiency, Fractures)  Immunization due - Plan: Pneumococcal conjugate vaccine 20-valent (Prevnar 20)  Following up today on diabetes- labs pending as above Her DM is under good control on a low dose of metformin  Advised that she does not need to continue to check home glucose unless she desires to do so BP under good control Updated pneumonia vaccine Will plan further follow- up pending labs.  This visit occurred during the SARS-CoV-2 public health emergency.  Safety protocols were in place, including screening questions prior to the visit, additional usage of staff PPE, and extensive cleaning of exam room while observing appropriate contact time as indicated for disinfecting solutions.    Signed Lamar Blinks, MD  Received her labs as below, message to patient Results for orders placed or performed in visit on 11/27/20  CBC  Result Value Ref Range   WBC 6.5 4.0 - 10.5 K/uL   RBC 4.53 3.87 - 5.11 Mil/uL   Platelets 209.0 150.0 - 400.0 K/uL   Hemoglobin 12.0 12.0 - 15.0 g/dL   HCT 37.2 36.0 - 46.0 %   MCV 82.1 78.0 - 100.0 fl   MCHC 32.3 30.0 - 36.0 g/dL   RDW 15.2 11.5 - 24.4 %  Basic metabolic panel  Result Value Ref Range   Sodium 140 135 - 145 mEq/L    Potassium 4.1 3.5 - 5.1 mEq/L   Chloride 102 96 - 112 mEq/L   CO2 31 19 - 32 mEq/L   Glucose, Bld 91 70 - 99 mg/dL   BUN 16 6 - 23 mg/dL   Creatinine, Ser 1.15 0.40 - 1.20 mg/dL   GFR 50.65 (L) >60.00 mL/min   Calcium 9.9 8.4 - 10.5 mg/dL  Hemoglobin A1c  Result Value Ref Range   Hgb A1c MFr Bld 6.6 (H) 4.6 - 6.5 %  VITAMIN D 25 Hydroxy (Vit-D Deficiency, Fractures)  Result Value Ref Range   VITD 43.69 30.00 - 100.00 ng/mL

## 2020-11-24 NOTE — Patient Instructions (Addendum)
It was great to see you again today-I will be in touch with your labs soon as possible  Assuming all is well, please see me in about 6 months You got your pneumonia vaccine today- you can do the 4th covid at your convenience  Keep up the good work!

## 2020-11-27 ENCOUNTER — Encounter: Payer: Self-pay | Admitting: Family Medicine

## 2020-11-27 ENCOUNTER — Other Ambulatory Visit: Payer: Self-pay

## 2020-11-27 ENCOUNTER — Ambulatory Visit: Payer: BLUE CROSS/BLUE SHIELD | Admitting: Family Medicine

## 2020-11-27 VITALS — BP 122/82 | HR 71 | Temp 97.9°F | Resp 17 | Ht 69.0 in | Wt 153.0 lb

## 2020-11-27 DIAGNOSIS — E559 Vitamin D deficiency, unspecified: Secondary | ICD-10-CM

## 2020-11-27 DIAGNOSIS — Z23 Encounter for immunization: Secondary | ICD-10-CM | POA: Diagnosis not present

## 2020-11-27 DIAGNOSIS — I10 Essential (primary) hypertension: Secondary | ICD-10-CM | POA: Diagnosis not present

## 2020-11-27 DIAGNOSIS — E1121 Type 2 diabetes mellitus with diabetic nephropathy: Secondary | ICD-10-CM | POA: Diagnosis not present

## 2020-11-27 LAB — VITAMIN D 25 HYDROXY (VIT D DEFICIENCY, FRACTURES): VITD: 43.69 ng/mL (ref 30.00–100.00)

## 2020-11-27 LAB — CBC
HCT: 37.2 % (ref 36.0–46.0)
Hemoglobin: 12 g/dL (ref 12.0–15.0)
MCHC: 32.3 g/dL (ref 30.0–36.0)
MCV: 82.1 fl (ref 78.0–100.0)
Platelets: 209 10*3/uL (ref 150.0–400.0)
RBC: 4.53 Mil/uL (ref 3.87–5.11)
RDW: 15.2 % (ref 11.5–15.5)
WBC: 6.5 10*3/uL (ref 4.0–10.5)

## 2020-11-27 LAB — BASIC METABOLIC PANEL
BUN: 16 mg/dL (ref 6–23)
CO2: 31 mEq/L (ref 19–32)
Calcium: 9.9 mg/dL (ref 8.4–10.5)
Chloride: 102 mEq/L (ref 96–112)
Creatinine, Ser: 1.15 mg/dL (ref 0.40–1.20)
GFR: 50.65 mL/min — ABNORMAL LOW (ref >60.00)
Glucose, Bld: 91 mg/dL (ref 70–99)
Potassium: 4.1 mEq/L (ref 3.5–5.1)
Sodium: 140 mEq/L (ref 135–145)

## 2020-11-27 LAB — HEMOGLOBIN A1C: Hgb A1c MFr Bld: 6.6 % — ABNORMAL HIGH (ref 4.6–6.5)

## 2020-11-27 MED ORDER — ACCU-CHEK GUIDE VI STRP: ORAL_STRIP | 12 refills | Status: DC

## 2020-11-27 MED ORDER — METFORMIN HCL 500 MG PO TABS: 500.0000 mg | ORAL_TABLET | Freq: Every day | ORAL | 3 refills | Status: DC

## 2020-12-03 ENCOUNTER — Other Ambulatory Visit: Payer: Self-pay

## 2020-12-03 ENCOUNTER — Ambulatory Visit (INDEPENDENT_AMBULATORY_CARE_PROVIDER_SITE_OTHER): Payer: BLUE CROSS/BLUE SHIELD | Admitting: *Deleted

## 2020-12-03 ENCOUNTER — Encounter: Payer: Self-pay | Admitting: Allergy and Immunology

## 2020-12-03 ENCOUNTER — Ambulatory Visit: Payer: BLUE CROSS/BLUE SHIELD | Admitting: Allergy and Immunology

## 2020-12-03 VITALS — BP 160/70 | HR 88 | Temp 98.0°F | Resp 16 | Ht 69.0 in | Wt 153.6 lb

## 2020-12-03 DIAGNOSIS — J3089 Other allergic rhinitis: Secondary | ICD-10-CM

## 2020-12-03 DIAGNOSIS — K219 Gastro-esophageal reflux disease without esophagitis: Secondary | ICD-10-CM

## 2020-12-03 DIAGNOSIS — J324 Chronic pansinusitis: Secondary | ICD-10-CM

## 2020-12-03 DIAGNOSIS — J4550 Severe persistent asthma, uncomplicated: Secondary | ICD-10-CM | POA: Diagnosis not present

## 2020-12-03 DIAGNOSIS — J339 Nasal polyp, unspecified: Secondary | ICD-10-CM

## 2020-12-03 MED ORDER — LEVALBUTEROL TARTRATE 45 MCG/ACT IN AERO: INHALATION_SPRAY | RESPIRATORY_TRACT | 3 refills | Status: DC

## 2020-12-03 MED ORDER — ADVAIR HFA 115-21 MCG/ACT IN AERO: 2.0000 | INHALATION_SPRAY | Freq: Two times a day (BID) | RESPIRATORY_TRACT | 6 refills | Status: DC

## 2020-12-03 MED ORDER — MOMETASONE FUROATE 50 MCG/ACT NA SUSP: 1.0000 | Freq: Two times a day (BID) | NASAL | 5 refills | Status: DC

## 2020-12-03 MED ORDER — MONTELUKAST SODIUM 10 MG PO TABS: ORAL_TABLET | ORAL | 1 refills | Status: DC

## 2020-12-03 NOTE — Patient Instructions (Addendum)
  1. Attempt to discontinue Montelukast.  2. Continue nasal mometasone - 1- 2 sprays each nostril 2 times per day    3. Continue Dupilumab injections    4.  Continue pantoprazole 40 mg daily  5.  Continue Xopenex HFA if needed  6. Return to clinic in 6 months or earlier if problem

## 2020-12-03 NOTE — Progress Notes (Signed)
[facility]   Follow-up Note  Referring Provider: Darreld Mclean, MD Primary Provider: Darreld Mclean, MD Date of Office Visit: 12/03/2020  Subjective:   Ashley Villarreal (DOB: 03/16/1956) is a 65 y.o. female who returns to the [facility] [place] on 12/03/2020 in re-evaluation of the following:  HPI: Kearia returns to this clinic in evaluation of severe asthma, chronic sinusitis, nasal polyposis, and reflux.  Her last visit to this clinic was 04 June 2020.  Once again she has had a wonderful interval of time since her last visit without any significant upper airway issues or lower airway issues.  She can exercise by walking almost an hour a day with no problem and she rarely uses a short acting bronchodilator.  She can smell and taste with no problem.  During her last visit we asked her to discontinue her Advair and indeed she has done very well without the use of this agent.  She does continue to use dupilumab injections on a consistent basis.  And she uses a nasal steroid and montelukast on a consistent basis.  Her reflux is under excellent control while using pantoprazole.  Allergies as of 12/03/2020       Reactions   Claritin [loratadine]    dizziness   Doxycycline Nausea Only   Other Other (See Comments)   Magic mouthwash  Patient get blisters on her lips        Medication List      Accu-Chek Guide test strip Generic drug: glucose blood Check blood sugar once daily   Accu-Chek Softclix Lancets lancets Check blood sugar once daily   Advair HFA 115-21 MCG/ACT inhaler Generic drug: fluticasone-salmeterol Inhale 2 puffs into the lungs 2 (two) times daily.   Alphaquin HP 4 % Crea APPLY TO DARK SPOTS DAILY.   amLODipine-valsartan 5-320 MG tablet Commonly known as: EXFORGE Take 1 tablet by mouth daily.   blood glucose meter kit and supplies Accucheck test strips and meter. Use up to four  times daily as directed. (FOR ICD-10 E10.9, E11.9).   Dupixent 300 MG/2ML prefilled syringe Generic drug: dupilumab INJECT 1 SYRINGE UNDER THE SKIN EVERY OTHER WEEK   EPINEPHrine 0.3 mg/0.3 mL Soaj injection Commonly known as: EpiPen 2-Pak Inject 0.3 mLs (0.3 mg total) into the muscle once.   levalbuterol 45 MCG/ACT inhaler Commonly known as: XOPENEX HFA TAKE 2 PUFFS BY MOUTH EVERY 6 HOURS AS NEEDED FOR WHEEZE   metFORMIN 500 MG tablet Commonly known as: GLUCOPHAGE Take 1 tablet (500 mg total) by mouth daily.   mometasone 50 MCG/ACT nasal spray Commonly known as: NASONEX Place 1-2 sprays into the nose 2 (two) times daily.   montelukast 10 MG tablet Commonly known as: SINGULAIR TAKE ONE TABLET ONCE DAILY AS DIRECTED.   NASAL SALINE NA Place 1 spray into the nose daily. Neilmed Nasal Saline Rinse to use 1-2 times daily.   rosuvastatin 20 MG tablet Commonly known as: Crestor Take 1 tablet (20 mg total) by mouth daily.   sterile water (preservative free) injection Inject 10 mLs as directed as directed.   Vitamin D3 1.25 MG (50000 UT) Caps Take 1 weekly for 12 weeks        Past Medical History:  Diagnosis Date   Allergy    Asthma    COPD (chronic obstructive pulmonary disease) ([place])    Diabetes mellitus without complication ([place])    Hypertension    Sleep apnea  uses CPAP    Past Surgical History:  Procedure Laterality Date   COLONOSCOPY     DILATION AND CURETTAGE OF UTERUS     NASAL SINUS SURGERY     TONSILLECTOMY  1975   TYMPANOSTOMY TUBE PLACEMENT      Review of systems negative except as noted in HPI / PMHx or noted below:  Review of Systems  Constitutional: Negative.   HENT: Negative.    Eyes: Negative.   Respiratory: Negative.    Cardiovascular: Negative.   Gastrointestinal: Negative.   Genitourinary: Negative.   Musculoskeletal: Negative.   Skin: Negative.   Neurological: Negative.   Endo/Heme/Allergies: Negative.    Psychiatric/Behavioral: Negative.      Objective:   Vitals:   12/03/20 1544  BP: (!) 160/70  Pulse: 88  Resp: 16  Temp: 98 F (36.7 C)  SpO2: 96%   Height: 5' 9"  (175.3 cm)  Weight: 153 lb 9.6 oz (69.7 kg)   Physical Exam Constitutional:      Appearance: She is not diaphoretic.  HENT:     Head: Normocephalic.     Right Ear: External ear normal.     Left Ear: External ear normal.     Nose: Nose normal. No mucosal edema or rhinorrhea.     Mouth/Throat:     Pharynx: Uvula midline. No oropharyngeal exudate.  Eyes:     Conjunctiva/sclera: Conjunctivae normal.  Neck:     Thyroid: No thyromegaly.     Trachea: Trachea normal. No tracheal tenderness or tracheal deviation.  Cardiovascular:     Rate and Rhythm: Normal rate and regular rhythm.     Heart sounds: Normal heart sounds, S1 normal and S2 normal. No murmur heard. Pulmonary:     Effort: No respiratory distress.     Breath sounds: Normal breath sounds. No stridor. No wheezing or rales.  Lymphadenopathy:     Head:     Right side of head: No tonsillar adenopathy.     Left side of head: No tonsillar adenopathy.     Cervical: No cervical adenopathy.  Skin:    Findings: No erythema or rash.     Nails: There is no clubbing.  Neurological:     Mental Status: She is alert.    Diagnostics:    Spirometry was performed and demonstrated an FEV1 of 1.88 at 118 % of predicted.  The patient had an Asthma Control Test with the following results: ACT Total Score: 23.    Assessment and Plan:   1. Asthma, severe persistent, well-controlled   2. Other allergic rhinitis   3. Chronic pansinusitis   4. Nasal polyposis   5. LPRD (laryngopharyngeal reflux disease)     1. Attempt to discontinue Montelukast.  2. Continue nasal mometasone - 1- 2 sprays each nostril 2 times per day    3. Continue Dupilumab injections    4.  Continue pantoprazole 40 mg daily  5.  Continue Xopenex HFA if needed  6. Return to clinic in 6  months or earlier if problem  Dupilumab has basically taken care of most of Delvina's upper and lower airway issues and I think there may be an opportunity to further consolidate her medical treatment.  During her last visit she was able to taper off her Advair and now we will see if she can taper off her montelukast.  She will continue on a nasal steroid and dupilumab and continue to treat her reflux disease with a proton pump inhibitor.  I will see her  back in this clinic in 6 months or earlier if there is a problem.  Allena Katz, MD Allergy / Immunology [place]

## 2020-12-04 ENCOUNTER — Other Ambulatory Visit: Payer: Self-pay

## 2020-12-04 ENCOUNTER — Telehealth: Payer: Self-pay | Admitting: Allergy and Immunology

## 2020-12-04 ENCOUNTER — Encounter: Payer: Self-pay | Admitting: Allergy and Immunology

## 2020-12-04 MED ORDER — PANTOPRAZOLE SODIUM 40 MG PO TBEC: 40.0000 mg | DELAYED_RELEASE_TABLET | Freq: Every day | ORAL | 5 refills | Status: DC

## 2020-12-04 NOTE — Telephone Encounter (Signed)
Patient called and said that you called in advair and we had discontinue need to have pantoprazole 40mg . [facility] [address] [phone].

## 2020-12-04 NOTE — Telephone Encounter (Signed)
Refill of pantoprazole 40mg  [facility] [address]

## 2020-12-07 NOTE — Progress Notes (Deleted)
HPI female never smoker followed for OSA, complicated by HBP, asthma/Xolair (Dr. Neldon Mc) NPSG 02/11/15- AHI 12.5 per hour, CPAP to 9   --------------------------------------------------------------------------------  12/08/19- 65 year old female never smoker followed for OSA, complicated by HBP, asthma/ Dupixent (Dr. Neldon Mc), DM2, GERD, CKD3,  CPAP 8/[employer] Download compliance 100%, AHI 4.3/ hr Body weight today 159 lbs Had 2 [place] is working very well for asthma- from her allergists. Denies interval medical issues. Doing well with CPAP. Download reviewed.   12/09/20- 65 year old female never smoker followed for OSA, complicated by HBP, asthma/ Dupixent (Dr. Neldon Mc), DM2, GERD, CKD3,  CPAP 8/[employer] Download- Body weight today- Covid vax-    ROS-see HPI   + = positive Constitutional:    weight loss, night sweats, fevers, chills, fatigue, lassitude. HEENT:    headaches, difficulty swallowing, tooth/dental problems, sore throat,       sneezing, itching, ear ache, + nasal congestion, post nasal drip, snoring CV:    chest pain, orthopnea, PND, swelling in lower extremities, anasarca,                                                      dizziness, palpitations Resp:   shortness of breath with exertion or at rest.                productive cough,   non-productive cough, coughing up of blood.              change in color of mucus.  wheezing.   Skin:    rash or lesions. GI:  No-   heartburn, indigestion, abdominal pain, nausea, vomiting,  GU: . MS:   joint pain, stiffness, decreased range of motion, back pain. Neuro-     nothing unusual Psych:  change in mood or affect.  depression or anxiety.   memory loss.  OBJ- Physical Exam General- Alert, Oriented, Affect-appropriate, Distress- none acute, normal weight/ trim fit appearing Skin- rash-none, lesions- none, excoriation- none Lymphadenopathy- none Head- atraumatic            Eyes- Gross vision intact, PERRLA, conjunctivae  and secretions clear            Ears- Hearing, canals-normal            Nose- + turbinate edema, no-Septal dev, mucus, polyps, erosion, perforation             Throat- Mallampati III-IV , mucosa clear , drainage- none, tonsils- atrophic Neck- flexible , trachea midline, no stridor , thyroid nl, carotid no bruit Chest - symmetrical excursion , unlabored           Heart/CV- RRR , no murmur , no gallop  , no rub, nl s1 s2                           - JVD- none , edema- none, stasis changes- none, varices- none           Lung- clear to P&A, wheeze- none, cough- none , dullness-none, rub- none           Chest wall-  Abd-  Br/ Gen/ Rectal- Not done, not indicated Extrem- cyanosis- none, clubbing, none, atrophy- none, strength- nl Neuro- grossly intact to observation

## 2020-12-09 ENCOUNTER — Ambulatory Visit: Payer: 59 | Admitting: Internal Medicine

## 2020-12-17 ENCOUNTER — Other Ambulatory Visit: Payer: Self-pay

## 2020-12-17 ENCOUNTER — Ambulatory Visit (INDEPENDENT_AMBULATORY_CARE_PROVIDER_SITE_OTHER): Payer: BLUE CROSS/BLUE SHIELD | Admitting: *Deleted

## 2020-12-17 DIAGNOSIS — J4550 Severe persistent asthma, uncomplicated: Secondary | ICD-10-CM | POA: Diagnosis not present

## 2020-12-19 ENCOUNTER — Telehealth: Payer: Self-pay

## 2020-12-19 NOTE — Telephone Encounter (Signed)
Dorothey Baseman from [facility] called to verified address for Dupixent to sent for patient on December 25, 2020.

## 2021-01-02 ENCOUNTER — Other Ambulatory Visit: Payer: Self-pay

## 2021-01-02 ENCOUNTER — Ambulatory Visit (INDEPENDENT_AMBULATORY_CARE_PROVIDER_SITE_OTHER): Payer: BLUE CROSS/BLUE SHIELD | Admitting: *Deleted

## 2021-01-02 DIAGNOSIS — J4550 Severe persistent asthma, uncomplicated: Secondary | ICD-10-CM | POA: Diagnosis not present

## 2021-01-14 ENCOUNTER — Other Ambulatory Visit: Payer: Self-pay

## 2021-01-14 ENCOUNTER — Ambulatory Visit (INDEPENDENT_AMBULATORY_CARE_PROVIDER_SITE_OTHER): Payer: BLUE CROSS/BLUE SHIELD | Admitting: *Deleted

## 2021-01-14 DIAGNOSIS — J4550 Severe persistent asthma, uncomplicated: Secondary | ICD-10-CM

## 2021-01-27 ENCOUNTER — Telehealth: Payer: Self-pay

## 2021-01-27 NOTE — Progress Notes (Signed)
HPI female never smoker followed for OSA, complicated by HBP, asthma/Xolair (Dr. Neldon Mc) NPSG 02/11/15- AHI 12.5 per hour, CPAP to 9   --------------------------------------------------------------------------------   12/08/19- 65 year old female never smoker followed for OSA, complicated by HBP, asthma/ Dupixent (Dr. Neldon Mc), DM2, GERD, CKD3,  CPAP 8/[facility] Download compliance 100%, AHI 4.3/ hr Body weight today 159 lbs Had 2 [place] is working very well for asthma- from her allergists. Denies interval medical issues. Doing well with CPAP. Download reviewed.   01/28/21- 65 year old female never smoker followed for OSA, complicated by HBP, asthma/ Dupixent (Dr. Neldon Mc), DM2, GERD, CKD3,  CPAP 8/[facility] Download- compliance 99%, AHI 4.4/ hr Body weight today-159 lbs Covid vax-3 [employer] She is clear that she is better off with CPAP.  Denies acute concerns.  Asthma is being managed by her allergist.  Her brother is now a patient of ours.   ROS-see HPI   + = positive Constitutional:    weight loss, night sweats, fevers, chills, fatigue, lassitude. HEENT:    headaches, difficulty swallowing, tooth/dental problems, sore throat,       sneezing, itching, ear ache, + nasal congestion, post nasal drip, snoring CV:    chest pain, orthopnea, PND, swelling in lower extremities, anasarca,                                                      dizziness, palpitations Resp:   shortness of breath with exertion or at rest.                productive cough,   non-productive cough, coughing up of blood.              change in color of mucus.  wheezing.   Skin:    rash or lesions. GI:  No-   heartburn, indigestion, abdominal pain, nausea, vomiting,  GU: . MS:   joint pain, stiffness, decreased range of motion, back pain. Neuro-     nothing unusual Psych:  change in mood or affect.  depression or anxiety.   memory loss.  OBJ- Physical Exam General- Alert, Oriented, Affect-appropriate, Distress-  none acute, normal weight/ trim fit appearing Skin- rash-none, lesions- none, excoriation- none Lymphadenopathy- none Head- atraumatic            Eyes- Gross vision intact, PERRLA, conjunctivae and secretions clear            Ears- Hearing, canals-normal            Nose- + turbinate edema, no-Septal dev, mucus, polyps, erosion, perforation             Throat- Mallampati III-IV , mucosa clear , drainage- none, tonsils- atrophic Neck- flexible , trachea midline, no stridor , thyroid nl, carotid no bruit Chest - symmetrical excursion , unlabored           Heart/CV- RRR , no murmur , no gallop  , no rub, nl s1 s2                           - JVD- none , edema- none, stasis changes- none, varices- none           Lung- clear to P&A, wheeze- none, cough- none , dullness-none, rub- none           Chest wall-  Abd-  Br/ Gen/ Rectal- Not done, not indicated Extrem- cyanosis- none, clubbing, none, atrophy- none, strength- nl Neuro- grossly intact to observation

## 2021-01-27 NOTE — Telephone Encounter (Signed)
Called and spoke with patient, she will bring sd card to upcoming appointment, nothing further needed.

## 2021-01-28 ENCOUNTER — Ambulatory Visit (INDEPENDENT_AMBULATORY_CARE_PROVIDER_SITE_OTHER): Payer: BLUE CROSS/BLUE SHIELD | Admitting: *Deleted

## 2021-01-28 ENCOUNTER — Encounter: Payer: Self-pay | Admitting: Internal Medicine

## 2021-01-28 ENCOUNTER — Other Ambulatory Visit: Payer: Self-pay

## 2021-01-28 ENCOUNTER — Ambulatory Visit: Payer: BLUE CROSS/BLUE SHIELD | Admitting: Internal Medicine

## 2021-01-28 VITALS — BP 142/80 | HR 74 | Temp 97.7°F | Ht 69.0 in | Wt 159.4 lb

## 2021-01-28 DIAGNOSIS — J4550 Severe persistent asthma, uncomplicated: Secondary | ICD-10-CM

## 2021-01-28 DIAGNOSIS — I119 Hypertensive heart disease without heart failure: Secondary | ICD-10-CM | POA: Diagnosis not present

## 2021-01-28 DIAGNOSIS — G4733 Obstructive sleep apnea (adult) (pediatric): Secondary | ICD-10-CM

## 2021-01-28 NOTE — Patient Instructions (Signed)
Order- DME [facility]   please replace old CPAP machine, change to auto 5-15, mask of choice, humidifier, supplies, AirView/ card    Please call if we can help

## 2021-02-11 ENCOUNTER — Ambulatory Visit (INDEPENDENT_AMBULATORY_CARE_PROVIDER_SITE_OTHER): Payer: BLUE CROSS/BLUE SHIELD | Admitting: *Deleted

## 2021-02-11 ENCOUNTER — Other Ambulatory Visit: Payer: Self-pay

## 2021-02-11 DIAGNOSIS — J4550 Severe persistent asthma, uncomplicated: Secondary | ICD-10-CM

## 2021-02-25 ENCOUNTER — Other Ambulatory Visit: Payer: Self-pay

## 2021-02-25 ENCOUNTER — Ambulatory Visit (INDEPENDENT_AMBULATORY_CARE_PROVIDER_SITE_OTHER): Payer: BLUE CROSS/BLUE SHIELD

## 2021-02-25 DIAGNOSIS — J4540 Moderate persistent asthma, uncomplicated: Secondary | ICD-10-CM

## 2021-03-11 ENCOUNTER — Other Ambulatory Visit: Payer: Self-pay

## 2021-03-11 ENCOUNTER — Ambulatory Visit (INDEPENDENT_AMBULATORY_CARE_PROVIDER_SITE_OTHER): Payer: BLUE CROSS/BLUE SHIELD | Admitting: *Deleted

## 2021-03-11 DIAGNOSIS — J4540 Moderate persistent asthma, uncomplicated: Secondary | ICD-10-CM

## 2021-03-25 ENCOUNTER — Ambulatory Visit (INDEPENDENT_AMBULATORY_CARE_PROVIDER_SITE_OTHER): Payer: BLUE CROSS/BLUE SHIELD | Admitting: *Deleted

## 2021-03-25 ENCOUNTER — Other Ambulatory Visit: Payer: Self-pay

## 2021-03-25 DIAGNOSIS — J4540 Moderate persistent asthma, uncomplicated: Secondary | ICD-10-CM | POA: Diagnosis not present

## 2021-04-02 ENCOUNTER — Other Ambulatory Visit: Payer: Self-pay | Admitting: Allergy and Immunology

## 2021-04-02 ENCOUNTER — Other Ambulatory Visit: Payer: Self-pay

## 2021-04-02 MED ORDER — PANTOPRAZOLE SODIUM 40 MG PO TBEC: 40.0000 mg | DELAYED_RELEASE_TABLET | Freq: Every day | ORAL | 0 refills | Status: DC

## 2021-04-08 ENCOUNTER — Ambulatory Visit (INDEPENDENT_AMBULATORY_CARE_PROVIDER_SITE_OTHER): Payer: BLUE CROSS/BLUE SHIELD

## 2021-04-08 ENCOUNTER — Other Ambulatory Visit: Payer: Self-pay

## 2021-04-08 DIAGNOSIS — J4540 Moderate persistent asthma, uncomplicated: Secondary | ICD-10-CM

## 2021-04-15 ENCOUNTER — Ambulatory Visit (INDEPENDENT_AMBULATORY_CARE_PROVIDER_SITE_OTHER): Payer: BLUE CROSS/BLUE SHIELD | Admitting: *Deleted

## 2021-04-15 ENCOUNTER — Telehealth: Payer: Self-pay | Admitting: Allergy & Immunology

## 2021-04-15 ENCOUNTER — Other Ambulatory Visit: Payer: Self-pay

## 2021-04-15 DIAGNOSIS — Z23 Encounter for immunization: Secondary | ICD-10-CM

## 2021-04-15 NOTE — Telephone Encounter (Signed)
Specialty pharmacy called to let me know that they are to be sending her medication tomorrow to the [place] office.  Confirmed address for the clinic.  Salvatore Marvel, MD [facility] [place] [facility] [place]

## 2021-04-22 ENCOUNTER — Ambulatory Visit (INDEPENDENT_AMBULATORY_CARE_PROVIDER_SITE_OTHER): Payer: BLUE CROSS/BLUE SHIELD | Admitting: *Deleted

## 2021-04-22 ENCOUNTER — Other Ambulatory Visit: Payer: Self-pay

## 2021-04-22 DIAGNOSIS — J4540 Moderate persistent asthma, uncomplicated: Secondary | ICD-10-CM

## 2021-04-28 ENCOUNTER — Ambulatory Visit: Payer: BLUE CROSS/BLUE SHIELD | Attending: Internal Medicine

## 2021-04-28 ENCOUNTER — Other Ambulatory Visit: Payer: Self-pay

## 2021-04-28 ENCOUNTER — Other Ambulatory Visit (HOSPITAL_BASED_OUTPATIENT_CLINIC_OR_DEPARTMENT_OTHER): Payer: Self-pay

## 2021-04-28 DIAGNOSIS — Z23 Encounter for immunization: Secondary | ICD-10-CM

## 2021-04-28 MED ORDER — PFIZER COVID-19 VAC BIVALENT 30 MCG/0.3ML IM SUSP
INTRAMUSCULAR | 0 refills | Status: DC
  Filled 2021-04-28: qty 0.3, 1d supply, fill #0

## 2021-04-28 NOTE — Progress Notes (Signed)
   Covid-19 Vaccination Clinic  Name:  Ashley Villarreal    MRN: [id number] DOB: 1955/12/30  04/28/2021  [name] was observed post Covid-19 immunization for 15 minutes without incident. She was provided with Vaccine Information Sheet and instruction to access the V-Safe system.   [name] was instructed to call 911 with any severe reactions post vaccine: Difficulty breathing  Swelling of face and throat  A fast heartbeat  A bad rash all over body  Dizziness and weakness   Immunizations Administered     Name Date Dose VIS Date Route   Pfizer Covid-19 Vaccine Bivalent Booster 04/28/2021 11:03 AM 0.3 mL 02/19/2021 Intramuscular   Manufacturer: [place]   Lot: [id number]   [place]: [id number]

## 2021-05-05 ENCOUNTER — Telehealth: Payer: Self-pay | Admitting: Internal Medicine

## 2021-05-06 ENCOUNTER — Other Ambulatory Visit: Payer: Self-pay

## 2021-05-06 ENCOUNTER — Ambulatory Visit (INDEPENDENT_AMBULATORY_CARE_PROVIDER_SITE_OTHER): Payer: BLUE CROSS/BLUE SHIELD | Admitting: *Deleted

## 2021-05-06 DIAGNOSIS — J4540 Moderate persistent asthma, uncomplicated: Secondary | ICD-10-CM | POA: Diagnosis not present

## 2021-05-06 NOTE — Telephone Encounter (Signed)
Call made to patient, confirmed DOB. Patient has not yet gotten the new cpap. She needs supplies for the old machine.   Community message sent to Leon with [employer]:   Vivia Ewing, LPN  New, Leory Plowman This patient is on the waiting list for a new cpap machine. She needs supplies for the old cpap machine. She has not yet gotten the new machine however when she called [facility] she was told she would have to pay for the supplies out of pocket since we sent in a new order for a cpap. Is this correct? We just wanted to check since she has not yet received the new cpap machine.   Thanks :)

## 2021-05-07 NOTE — Telephone Encounter (Signed)
Ashley Villarreal; Vivia Ewing, LPN; Sheran Lawless, [place],   I received a reply back that we have called and scheduled the patient for her setup Monday 05/12/21 at 330pm.   Thank you,   Leroy [place]    Nothing further needed at this time.

## 2021-05-07 NOTE — Telephone Encounter (Signed)
New, Rosana Fret, [place], LPN; [place], [place],   I pulled the patients account and reviewed for notes, I did not see anything about the supplies. I have never heard of this being the case so I copied your message and sent it to our intake team to review and respond back to me. I will let you know once I get a reply.  I also ask them to let me know if anything would be needed to supply these items so I can request if needed.   Thank you,   Demetrius Charity

## 2021-05-19 ENCOUNTER — Other Ambulatory Visit: Payer: Self-pay | Admitting: Allergy and Immunology

## 2021-05-20 ENCOUNTER — Ambulatory Visit (INDEPENDENT_AMBULATORY_CARE_PROVIDER_SITE_OTHER): Payer: BLUE CROSS/BLUE SHIELD | Admitting: *Deleted

## 2021-05-20 ENCOUNTER — Other Ambulatory Visit: Payer: Self-pay

## 2021-05-20 ENCOUNTER — Other Ambulatory Visit: Payer: Self-pay | Admitting: Family Medicine

## 2021-05-20 DIAGNOSIS — J4540 Moderate persistent asthma, uncomplicated: Secondary | ICD-10-CM

## 2021-05-27 NOTE — Patient Instructions (Addendum)
Good to see you again -I will be in touch with your labs  Assuming all is well please see me in about 6 months  If you have time stop by the imaging dept on the ground floor to do your bone density today- or schedule for a convenient time

## 2021-05-27 NOTE — Progress Notes (Addendum)
[facility] [place] [address], [address] [place],  [address] [phone] Fax [phone]  Date:  06/02/2021   Name:  Ashley Villarreal   DOB:  03/21/1956   MRN:  [id number]  PCP:  Copland, Jessica C, MD    Chief Complaint: 6 month follow up (Concerns/ questions: none/Flu shot: received already/)   History of Present Illness:  Ashley Villarreal is a 65 y.o. very pleasant female patient who presents with the following:  Pt seen today for periodic follow-up Last seen by myself in June-  History of hypertension, asthma, sleep apnea, well controlled diabetes, hyperlipidemia, chronic kidney disease, vitamin D deficiency She also sees allergy/ asthma (Kozlow)- she is doing immunotherapy  No longer seeing nephology- she was released from care   She has noted a significant decrease in her allergy and asthma sx- she is not sure how long they plan to continue immunotherapy  Eye exam - done in May of this year  She has lost a few lbs- she is doing 6-7 miles of walking a day and watching her diet   Wt Readings from Last 3 Encounters:  06/02/21 155 lb (70.3 kg)  01/28/21 159 lb 6.4 oz (72.3 kg)  12/03/20 153 lb 9.6 oz (69.7 kg)    Lab Results  Component Value Date   HGBA1C 6.6 (H) 11/27/2020    Patient Active Problem List   Diagnosis Date Noted   CKD (chronic kidney disease) stage 3, GFR 30-59 ml/min (HCC) 04/13/2018   Gastroesophageal reflux disease without esophagitis 06/09/2017   Anosmia 06/09/2017   Asthma, severe persistent, well-controlled 06/12/2015   LPRD (laryngopharyngeal reflux disease) 06/12/2015   Allergic rhinoconjunctivitis 06/12/2015   Allergy with anaphylaxis due to food 06/12/2015   Sleep apnea, obstructive    Hyperlipemia 08/10/2011   Hypertensive heart disease    Diabetes mellitus type 2, noninsulin dependent (HCC)     Past Medical History:  Diagnosis Date   Allergy    Asthma    COPD (chronic obstructive  pulmonary disease) (HCC)    Diabetes mellitus without complication (HCC)    Hypertension    Sleep apnea    uses CPAP    Past Surgical History:  Procedure Laterality Date   COLONOSCOPY     DILATION AND CURETTAGE OF UTERUS     NASAL SINUS SURGERY     TONSILLECTOMY  1975   TYMPANOSTOMY TUBE PLACEMENT      Social History   Tobacco Use   Smoking status: Never   Smokeless tobacco: Never  Vaping Use   Vaping Use: Never used  Substance Use Topics   Alcohol use: No    Alcohol/week: 0.0 standard drinks   Drug use: No    Family History  Problem Relation Age of Onset   Stroke Father    Heart disease Father    Diabetes Mother    Heart disease Mother    Stroke Mother    Hyperlipidemia Sister    Hyperlipidemia Brother    Diabetes Paternal Grandmother    Hypertension Brother    Colon cancer Neg Hx    Allergic rhinitis Neg Hx    Angioedema Neg Hx    Asthma Neg Hx    Atopy Neg Hx    Eczema Neg Hx    Immunodeficiency Neg Hx    Urticaria Neg Hx     Allergies  Allergen Reactions   Claritin [Loratadine]     dizziness   Doxycycline Nausea Only     Other Other (See Comments)    Magic mouthwash  Patient get blisters on her lips    Medication list has been reviewed and updated.  Current Outpatient Medications on File Prior to Visit  Medication Sig Dispense Refill   Accu-Chek Softclix Lancets lancets Check blood sugar once daily 100 each 12   amLODipine-valsartan (EXFORGE) 5-320 MG tablet Take 1 tablet by mouth daily.     blood glucose meter kit and supplies Accucheck test strips and meter. Use up to four times daily as directed. (FOR ICD-10 E10.9, E11.9). 1 each 0   Cholecalciferol (VITAMIN D3) 1.25 MG (50000 UT) CAPS Take 1 weekly for 12 weeks 12 capsule 0   COVID-19 mRNA bivalent vaccine, Pfizer, (PFIZER COVID-19 VAC BIVALENT) injection Inject into the muscle. 0.3 mL 0   DUPIXENT 300 MG/2ML prefilled syringe INJECT 1 SYRINGE UNDER THE SKIN EVERY OTHER WEEK 4 mL 10    EPINEPHrine (EPIPEN 2-PAK) 0.3 mg/0.3 mL IJ SOAJ injection Inject 0.3 mLs (0.3 mg total) into the muscle once. 2 Device 1   glucose blood (ACCU-CHEK GUIDE) test strip Check blood sugar once daily 50 each 12   Hydroquinone-Sunscreens (ALPHAQUIN HP) 4 % CREA APPLY TO DARK SPOTS DAILY.  3   levalbuterol (XOPENEX HFA) 45 MCG/ACT inhaler TAKE 2 PUFFS BY MOUTH EVERY 6 HOURS AS NEEDED FOR WHEEZE 15 each 3   metFORMIN (GLUCOPHAGE) 500 MG tablet Take 1 tablet (500 mg total) by mouth daily. 90 tablet 3   mometasone (NASONEX) 50 MCG/ACT nasal spray Place 1-2 sprays into the nose 2 (two) times daily. 17 g 5   montelukast (SINGULAIR) 10 MG tablet TAKE ONE TABLET ONCE DAILY AS DIRECTED. 90 tablet 1   NASAL SALINE NA Place 1 spray into the nose daily. Neilmed Nasal Saline Rinse to use 1-2 times daily.     pantoprazole (PROTONIX) 40 MG tablet Take 1 tablet (40 mg total) by mouth daily. 90 tablet 0   rosuvastatin (CRESTOR) 20 MG tablet TAKE 1 TABLET BY MOUTH EVERY DAY 90 tablet 3   Current Facility-Administered Medications on File Prior to Visit  Medication Dose Route Frequency Provider Last Rate Last Admin   dupilumab (DUPIXENT) prefilled syringe 300 mg  300 mg Subcutaneous Q14 Days Kozlow, Eric J, MD   300 mg at 05/20/21 1541   omalizumab (XOLAIR) injection 150 mg  150 mg Subcutaneous Q28 days Kozlow, Eric J, MD   150 mg at 07/25/18 1118    Review of Systems:  As per HPI- otherwise negative.   Physical Examination: Vitals:   06/02/21 0910  BP: 136/74  Pulse: 71  Resp: 18  Temp: 98 F (36.7 C)  SpO2: 98%   Vitals:   06/02/21 0910  Weight: 155 lb (70.3 kg)  Height: 5' 9" (1.753 m)   Body mass index is 22.89 kg/m. Ideal Body Weight: Weight in (lb) to have BMI = 25: 168.9  GEN: no acute distress.Looks well, normal weight HEENT: Atraumatic, Normocephalic.  Ears and Nose: No external deformity. CV: RRR, No M/G/R. No JVD. No thrill. No extra heart sounds. PULM: CTA B, no wheezes, crackles,  rhonchi. No retractions. No resp. distress. No accessory muscle use. ABD: S, NT, ND. No rebound. No HSM. EXTR: No c/c/e PSYCH: Normally interactive. Conversant.    Assessment and Plan: Essential hypertension - Plan: Comprehensive metabolic panel  Hyperlipidemia, unspecified hyperlipidemia type - Plan: Lipid panel  Controlled type 2 diabetes mellitus with diabetic nephropathy, without long-term current use of insulin (HCC) - Plan: Comprehensive metabolic   panel, Hemoglobin A1c  Stage 3a chronic kidney disease (HCC) - Plan: Comprehensive metabolic panel  Screening for thyroid disorder - Plan: TSH  Asthma, severe persistent, well-controlled - Plan: montelukast (SINGULAIR) 10 MG tablet  Estrogen deficiency - Plan: DG Bone Density  Gastroesophageal reflux disease without esophagitis - Plan: pantoprazole (PROTONIX) 40 MG tablet  LPRD (laryngopharyngeal reflux disease) - Plan: pantoprazole (PROTONIX) 40 MG tablet  Patient seen today for follow-up. She is doing very well, maintaining her weight/has actually lost a few pounds.  She is exercising quite a bit  Ordered bone density scan  Immunizations are up-to-date  Continue current medications  Will plan further follow- up pending labs.   Signed Jessica Copland, MD  Received labs as below- message to pt  Results for orders placed or performed in visit on 06/02/21  Comprehensive metabolic panel  Result Value Ref Range   Sodium 140 135 - 145 mEq/L   Potassium 4.2 3.5 - 5.1 mEq/L   Chloride 103 96 - 112 mEq/L   CO2 28 19 - 32 mEq/L   Glucose, Bld 85 70 - 99 mg/dL   BUN 17 6 - 23 mg/dL   Creatinine, Ser 1.13 0.40 - 1.20 mg/dL   Total Bilirubin 0.4 0.2 - 1.2 mg/dL   Alkaline Phosphatase 68 39 - 117 U/L   AST 20 0 - 37 U/L   ALT 20 0 - 35 U/L   Total Protein 7.1 6.0 - 8.3 g/dL   Albumin 4.3 3.5 - 5.2 g/dL   GFR 51.54 (L) >60.00 mL/min   Calcium 9.9 8.4 - 10.5 mg/dL  Hemoglobin A1c  Result Value Ref Range   Hgb A1c MFr Bld  6.9 (H) 4.6 - 6.5 %  Lipid panel  Result Value Ref Range   Cholesterol 206 (H) 0 - 200 mg/dL   Triglycerides 73.0 0.0 - 149.0 mg/dL   HDL 52.60 >39.00 mg/dL   VLDL 14.6 0.0 - 40.0 mg/dL   LDL Cholesterol 139 (H) 0 - 99 mg/dL   Total CHOL/HDL Ratio 4    NonHDL 153.78   TSH  Result Value Ref Range   TSH 0.85 0.35 - 5.50 uIU/mL     

## 2021-06-02 ENCOUNTER — Ambulatory Visit (INDEPENDENT_AMBULATORY_CARE_PROVIDER_SITE_OTHER): Payer: BLUE CROSS/BLUE SHIELD | Admitting: Family Medicine

## 2021-06-02 ENCOUNTER — Encounter: Payer: Self-pay | Admitting: Family Medicine

## 2021-06-02 VITALS — BP 136/74 | HR 71 | Temp 98.0°F | Resp 18 | Ht 69.0 in | Wt 155.0 lb

## 2021-06-02 DIAGNOSIS — E1121 Type 2 diabetes mellitus with diabetic nephropathy: Secondary | ICD-10-CM

## 2021-06-02 DIAGNOSIS — Z1329 Encounter for screening for other suspected endocrine disorder: Secondary | ICD-10-CM

## 2021-06-02 DIAGNOSIS — N1831 Chronic kidney disease, stage 3a: Secondary | ICD-10-CM | POA: Diagnosis not present

## 2021-06-02 DIAGNOSIS — E785 Hyperlipidemia, unspecified: Secondary | ICD-10-CM

## 2021-06-02 DIAGNOSIS — I10 Essential (primary) hypertension: Secondary | ICD-10-CM | POA: Diagnosis not present

## 2021-06-02 DIAGNOSIS — J4550 Severe persistent asthma, uncomplicated: Secondary | ICD-10-CM

## 2021-06-02 DIAGNOSIS — E2839 Other primary ovarian failure: Secondary | ICD-10-CM

## 2021-06-02 DIAGNOSIS — K219 Gastro-esophageal reflux disease without esophagitis: Secondary | ICD-10-CM

## 2021-06-02 LAB — COMPREHENSIVE METABOLIC PANEL
ALT: 20 U/L (ref 0–35)
AST: 20 U/L (ref 0–37)
Albumin: 4.3 g/dL (ref 3.5–5.2)
Alkaline Phosphatase: 68 U/L (ref 39–117)
BUN: 17 mg/dL (ref 6–23)
CO2: 28 mEq/L (ref 19–32)
Calcium: 9.9 mg/dL (ref 8.4–10.5)
Chloride: 103 mEq/L (ref 96–112)
Creatinine, Ser: 1.13 mg/dL (ref 0.40–1.20)
GFR: 51.54 mL/min — ABNORMAL LOW (ref >60.00)
Glucose, Bld: 85 mg/dL (ref 70–99)
Potassium: 4.2 mEq/L (ref 3.5–5.1)
Sodium: 140 mEq/L (ref 135–145)
Total Bilirubin: 0.4 mg/dL (ref 0.2–1.2)
Total Protein: 7.1 g/dL (ref 6.0–8.3)

## 2021-06-02 LAB — LIPID PANEL
Cholesterol: 206 mg/dL — ABNORMAL HIGH (ref 0–200)
HDL: 52.6 mg/dL (ref >39.00)
LDL Cholesterol: 139 mg/dL — ABNORMAL HIGH (ref 0–99)
NonHDL: 153.78
Total CHOL/HDL Ratio: 4
Triglycerides: 73 mg/dL (ref 0.0–149.0)
VLDL: 14.6 mg/dL (ref 0.0–40.0)

## 2021-06-02 LAB — HEMOGLOBIN A1C: Hgb A1c MFr Bld: 6.9 % — ABNORMAL HIGH (ref 4.6–6.5)

## 2021-06-02 LAB — TSH: TSH: 0.85 u[IU]/mL (ref 0.35–5.50)

## 2021-06-02 MED ORDER — PANTOPRAZOLE SODIUM 40 MG PO TBEC
40.0000 mg | DELAYED_RELEASE_TABLET | Freq: Every day | ORAL | 3 refills | Status: DC
Start: 2021-06-02 — End: 2022-06-09

## 2021-06-02 MED ORDER — MONTELUKAST SODIUM 10 MG PO TABS: ORAL_TABLET | ORAL | 3 refills | Status: DC

## 2021-06-03 ENCOUNTER — Ambulatory Visit (INDEPENDENT_AMBULATORY_CARE_PROVIDER_SITE_OTHER): Payer: BLUE CROSS/BLUE SHIELD | Admitting: *Deleted

## 2021-06-03 ENCOUNTER — Other Ambulatory Visit: Payer: Self-pay

## 2021-06-03 DIAGNOSIS — J4540 Moderate persistent asthma, uncomplicated: Secondary | ICD-10-CM | POA: Diagnosis not present

## 2021-06-05 ENCOUNTER — Ambulatory Visit (HOSPITAL_BASED_OUTPATIENT_CLINIC_OR_DEPARTMENT_OTHER)
Admission: RE | Admit: 2021-06-05 | Discharge: 2021-06-05 | Disposition: A | Discharge status: Home / Self Care | Payer: BLUE CROSS/BLUE SHIELD | Source: Ambulatory Visit | Attending: Family Medicine | Admitting: Family Medicine

## 2021-06-05 ENCOUNTER — Encounter: Payer: Self-pay | Admitting: Family Medicine

## 2021-06-05 ENCOUNTER — Other Ambulatory Visit: Payer: Self-pay

## 2021-06-05 DIAGNOSIS — M8580 Other specified disorders of bone density and structure, unspecified site: Secondary | ICD-10-CM | POA: Insufficient documentation

## 2021-06-05 DIAGNOSIS — E2839 Other primary ovarian failure: Secondary | ICD-10-CM

## 2021-06-10 ENCOUNTER — Ambulatory Visit: Payer: BLUE CROSS/BLUE SHIELD | Admitting: Allergy and Immunology

## 2021-06-10 ENCOUNTER — Other Ambulatory Visit: Payer: Self-pay

## 2021-06-10 VITALS — BP 158/78 | HR 85 | Temp 97.2°F | Resp 16 | Ht 68.0 in | Wt 156.4 lb

## 2021-06-10 DIAGNOSIS — Z87892 Personal history of anaphylaxis: Secondary | ICD-10-CM | POA: Diagnosis not present

## 2021-06-10 DIAGNOSIS — J3089 Other allergic rhinitis: Secondary | ICD-10-CM | POA: Diagnosis not present

## 2021-06-10 DIAGNOSIS — J324 Chronic pansinusitis: Secondary | ICD-10-CM | POA: Diagnosis not present

## 2021-06-10 DIAGNOSIS — J339 Nasal polyp, unspecified: Secondary | ICD-10-CM

## 2021-06-10 DIAGNOSIS — M8580 Other specified disorders of bone density and structure, unspecified site: Secondary | ICD-10-CM

## 2021-06-10 DIAGNOSIS — J4550 Severe persistent asthma, uncomplicated: Secondary | ICD-10-CM

## 2021-06-10 DIAGNOSIS — K219 Gastro-esophageal reflux disease without esophagitis: Secondary | ICD-10-CM

## 2021-06-10 MED ORDER — MOMETASONE FUROATE 50 MCG/ACT NA SUSP: 1.0000 | Freq: Two times a day (BID) | NASAL | 5 refills | Status: AC

## 2021-06-10 MED ORDER — LEVALBUTEROL TARTRATE 45 MCG/ACT IN AERO: INHALATION_SPRAY | RESPIRATORY_TRACT | 1 refills | Status: DC

## 2021-06-10 MED ORDER — EPINEPHRINE 0.3 MG/0.3ML IJ SOAJ
0.3000 mg | INTRAMUSCULAR | 1 refills | Status: AC | PRN
Start: 2021-06-10 — End: ?

## 2021-06-10 NOTE — Patient Instructions (Addendum)
°  1.  Continue nasal mometasone - 1- 2 sprays each nostril 2 times per day    2.  Continue Dupilumab injections    3.  Continue pantoprazole 40 mg daily  4.  Continue Xopenex HFA if needed  5. Weight bearing exercise to prevent osteoporosis  6. Return to clinic in 12 months or earlier if problem

## 2021-06-10 NOTE — Progress Notes (Signed)
[facility]   Follow-up Note  Referring Provider: Darreld Mclean, MD Primary Provider: Darreld Mclean, MD Date of Office Visit: 06/10/2021  Subjective:   Ashley Villarreal (DOB: 1955-10-28) is a 65 y.o. female who returns to the [facility] on 06/10/2021 in re-evaluation of the following:  HPI: Karsynn returns to this clinic in evaluation of severe asthma, chronic sinusitis, nasal polyposis, and reflux.  Her last visit to this clinic was 03 December 2020.   She continues to do wonderful with her multiorgan inflammatory disease involving her respiratory tract while using dupilumab.  Her nose has really been doing very well and she has had no issues with her nose while using dupilumab and nasal mometasone on a consistent basis.  She has not required a systemic steroid or an antibiotic to treat any type of upper airway issue.  Her asthma has been nonexistent and she rarely uses a short acting bronchodilator and she exercises without any difficulty.  Her reflux is under excellent control at this point time on her current therapy.  She has been diagnosed with osteopenia.  She has received 3 COVID vaccines and this year's flu vaccine.  Allergies as of 06/10/2021       Reactions   Claritin [loratadine]    dizziness   Doxycycline Nausea Only   Other Other (See Comments)   Magic mouthwash  Patient get blisters on her lips        Medication List    Accu-Chek Guide test strip Generic drug: glucose blood Check blood sugar once daily   Accu-Chek Softclix Lancets lancets Check blood sugar once daily   Alphaquin HP 4 % Crea APPLY TO DARK SPOTS DAILY.   amLODipine-valsartan 5-320 MG tablet Commonly known as: EXFORGE Take 1 tablet by mouth daily.   blood glucose meter kit and supplies Accucheck test strips and meter. Use up to four times daily as directed. (FOR ICD-10 E10.9, E11.9).   Dupixent 300 MG/2ML  prefilled syringe Generic drug: dupilumab INJECT 1 SYRINGE UNDER THE SKIN EVERY OTHER WEEK   EPINEPHrine 0.3 mg/0.3 mL Soaj injection Commonly known as: EpiPen 2-Pak Inject 0.3 mg into the muscle as needed for anaphylaxis.   levalbuterol 45 MCG/ACT inhaler Commonly known as: XOPENEX HFA TAKE 2 PUFFS BY MOUTH EVERY 6 HOURS AS NEEDED FOR WHEEZE   metFORMIN 500 MG tablet Commonly known as: GLUCOPHAGE Take 1 tablet (500 mg total) by mouth daily.   mometasone 50 MCG/ACT nasal spray Commonly known as: NASONEX Place 1-2 sprays into the nose 2 (two) times daily.   montelukast 10 MG tablet Commonly known as: SINGULAIR TAKE ONE TABLET ONCE DAILY AS DIRECTED.   NASAL SALINE NA Place 1 spray into the nose daily. Neilmed Nasal Saline Rinse to use 1-2 times daily.   pantoprazole 40 MG tablet Commonly known as: PROTONIX Take 1 tablet (40 mg total) by mouth daily.   rosuvastatin 20 MG tablet Commonly known as: CRESTOR TAKE 1 TABLET BY MOUTH EVERY DAY   Vitamin D3 1.25 MG (50000 UT) Caps Take 1 weekly for 12 weeks    Past Medical History:  Diagnosis Date   Allergy    Asthma    COPD (chronic obstructive pulmonary disease) ([place])    Diabetes mellitus without complication ([place])    Hypertension    Sleep apnea    uses CPAP    Past Surgical History:  Procedure Laterality Date   COLONOSCOPY  DILATION AND CURETTAGE OF UTERUS     NASAL SINUS SURGERY     TONSILLECTOMY  1975   TYMPANOSTOMY TUBE PLACEMENT      Review of systems negative except as noted in HPI / PMHx or noted below:  Review of Systems  Constitutional: Negative.   HENT: Negative.    Eyes: Negative.   Respiratory: Negative.    Cardiovascular: Negative.   Gastrointestinal: Negative.   Genitourinary: Negative.   Musculoskeletal: Negative.   Skin: Negative.   Neurological: Negative.   Endo/Heme/Allergies: Negative.   Psychiatric/Behavioral: Negative.      Objective:   Vitals:   06/10/21 1553  BP: (!)  158/78  Pulse: 85  Resp: 16  Temp: (!) 97.2 F (36.2 C)  SpO2: 100%   Height: 5' 8"  (172.7 cm)  Weight: 156 lb 6.4 oz (70.9 kg)   Physical Exam Constitutional:      Appearance: She is not diaphoretic.  HENT:     Head: Normocephalic.     Right Ear: Tympanic membrane, ear canal and external ear normal.     Left Ear: Tympanic membrane, ear canal and external ear normal.     Nose: Nose normal. No mucosal edema or rhinorrhea.     Mouth/Throat:     Pharynx: Uvula midline. No oropharyngeal exudate.  Eyes:     Conjunctiva/sclera: Conjunctivae normal.  Neck:     Thyroid: No thyromegaly.     Trachea: Trachea normal. No tracheal tenderness or tracheal deviation.  Cardiovascular:     Rate and Rhythm: Normal rate and regular rhythm.     Heart sounds: Normal heart sounds, S1 normal and S2 normal. No murmur heard. Pulmonary:     Effort: No respiratory distress.     Breath sounds: Normal breath sounds. No stridor. No wheezing or rales.  Lymphadenopathy:     Head:     Right side of head: No tonsillar adenopathy.     Left side of head: No tonsillar adenopathy.     Cervical: No cervical adenopathy.  Skin:    Findings: No erythema or rash.     Nails: There is no clubbing.  Neurological:     Mental Status: She is alert.    Diagnostics:    Spirometry was performed and demonstrated an FEV1 of 1.16 at 50 % of predicted.  Assessment and Plan:   1. Asthma, severe persistent, well-controlled   2. History of anaphylaxis   3. Other allergic rhinitis   4. Chronic pansinusitis   5. Nasal polyposis   6. LPRD (laryngopharyngeal reflux disease)   7. Osteopenia, unspecified location     1.  Continue nasal mometasone - 1- 2 sprays each nostril 2 times per day    2.  Continue Dupilumab injections    3.  Continue pantoprazole 40 mg daily  4.  Continue Xopenex HFA if needed  5. Weight bearing exercise to prevent osteoporosis  6. Return to clinic in 12 months or earlier if  problem  Rafeef's respiratory tract disease is under excellent control while using a anti-IL-4/IL-13 biologic agent along with some nasal steroids and her reflux is also under excellent control while using her pantoprazole.  She will continue on this plan and we will see her back in this clinic in 1 year or earlier if there is a problem.  She has been diagnosed with osteopenia and I did have a talk to her today about undergoing weightbearing exercise which hopefully will help her prevent the development of osteoporosis.  Allena Katz,  MD Allergy / Immunology [facility]

## 2021-06-11 ENCOUNTER — Encounter: Payer: Self-pay | Admitting: Allergy and Immunology

## 2021-06-17 ENCOUNTER — Other Ambulatory Visit: Payer: Self-pay

## 2021-06-17 ENCOUNTER — Ambulatory Visit (INDEPENDENT_AMBULATORY_CARE_PROVIDER_SITE_OTHER): Payer: BLUE CROSS/BLUE SHIELD | Admitting: *Deleted

## 2021-06-17 DIAGNOSIS — J4540 Moderate persistent asthma, uncomplicated: Secondary | ICD-10-CM | POA: Diagnosis not present

## 2021-06-24 ENCOUNTER — Encounter: Payer: Self-pay | Admitting: Internal Medicine

## 2021-06-24 NOTE — Assessment & Plan Note (Signed)
Managed by her allergist (Dr. Neldon Mc)

## 2021-06-24 NOTE — Assessment & Plan Note (Signed)
Benefits from CPAP with good compliance and control. Plan-replace old machine, changing to AutoPap 5-15 

## 2021-06-24 NOTE — Assessment & Plan Note (Signed)
She understands that control of OSA is likely beneficial for her cardiac and blood pressure issues.

## 2021-06-26 ENCOUNTER — Other Ambulatory Visit: Payer: Self-pay | Admitting: Family Medicine

## 2021-06-26 DIAGNOSIS — Z1231 Encounter for screening mammogram for malignant neoplasm of breast: Secondary | ICD-10-CM

## 2021-06-30 DIAGNOSIS — H9222 Otorrhagia, left ear: Secondary | ICD-10-CM | POA: Insufficient documentation

## 2021-06-30 DIAGNOSIS — H7112 Cholesteatoma of tympanum, left ear: Secondary | ICD-10-CM | POA: Insufficient documentation

## 2021-07-01 ENCOUNTER — Other Ambulatory Visit: Payer: Self-pay

## 2021-07-01 ENCOUNTER — Ambulatory Visit (INDEPENDENT_AMBULATORY_CARE_PROVIDER_SITE_OTHER): Payer: BLUE CROSS/BLUE SHIELD | Admitting: *Deleted

## 2021-07-01 DIAGNOSIS — J4540 Moderate persistent asthma, uncomplicated: Secondary | ICD-10-CM | POA: Diagnosis not present

## 2021-07-15 ENCOUNTER — Other Ambulatory Visit: Payer: Self-pay

## 2021-07-15 ENCOUNTER — Ambulatory Visit (INDEPENDENT_AMBULATORY_CARE_PROVIDER_SITE_OTHER): Payer: BLUE CROSS/BLUE SHIELD

## 2021-07-15 DIAGNOSIS — J4540 Moderate persistent asthma, uncomplicated: Secondary | ICD-10-CM | POA: Diagnosis not present

## 2021-07-22 ENCOUNTER — Other Ambulatory Visit: Payer: Self-pay

## 2021-07-22 ENCOUNTER — Ambulatory Visit
Admission: RE | Admit: 2021-07-22 | Discharge: 2021-07-22 | Disposition: A | Discharge status: Home / Self Care | Payer: BLUE CROSS/BLUE SHIELD | Source: Ambulatory Visit | Attending: Family Medicine | Admitting: Family Medicine

## 2021-07-22 DIAGNOSIS — Z1231 Encounter for screening mammogram for malignant neoplasm of breast: Secondary | ICD-10-CM

## 2021-07-29 ENCOUNTER — Ambulatory Visit (INDEPENDENT_AMBULATORY_CARE_PROVIDER_SITE_OTHER): Payer: BLUE CROSS/BLUE SHIELD | Admitting: *Deleted

## 2021-07-29 ENCOUNTER — Other Ambulatory Visit: Payer: Self-pay

## 2021-07-29 DIAGNOSIS — J4540 Moderate persistent asthma, uncomplicated: Secondary | ICD-10-CM

## 2021-08-12 ENCOUNTER — Ambulatory Visit (INDEPENDENT_AMBULATORY_CARE_PROVIDER_SITE_OTHER): Payer: BLUE CROSS/BLUE SHIELD

## 2021-08-12 ENCOUNTER — Other Ambulatory Visit: Payer: Self-pay

## 2021-08-12 DIAGNOSIS — J4540 Moderate persistent asthma, uncomplicated: Secondary | ICD-10-CM | POA: Diagnosis not present

## 2021-08-26 ENCOUNTER — Other Ambulatory Visit: Payer: Self-pay

## 2021-08-26 ENCOUNTER — Ambulatory Visit (INDEPENDENT_AMBULATORY_CARE_PROVIDER_SITE_OTHER): Payer: BLUE CROSS/BLUE SHIELD

## 2021-08-26 DIAGNOSIS — J4540 Moderate persistent asthma, uncomplicated: Secondary | ICD-10-CM

## 2021-09-09 ENCOUNTER — Other Ambulatory Visit: Payer: Self-pay

## 2021-09-09 ENCOUNTER — Ambulatory Visit (INDEPENDENT_AMBULATORY_CARE_PROVIDER_SITE_OTHER): Payer: BLUE CROSS/BLUE SHIELD

## 2021-09-09 DIAGNOSIS — J4540 Moderate persistent asthma, uncomplicated: Secondary | ICD-10-CM | POA: Diagnosis not present

## 2021-09-09 DIAGNOSIS — J309 Allergic rhinitis, unspecified: Secondary | ICD-10-CM | POA: Diagnosis not present

## 2021-09-23 ENCOUNTER — Ambulatory Visit (INDEPENDENT_AMBULATORY_CARE_PROVIDER_SITE_OTHER): Payer: BLUE CROSS/BLUE SHIELD

## 2021-09-23 DIAGNOSIS — J4540 Moderate persistent asthma, uncomplicated: Secondary | ICD-10-CM

## 2021-09-25 ENCOUNTER — Telehealth: Payer: Self-pay | Admitting: *Deleted

## 2021-09-25 NOTE — Telephone Encounter (Signed)
I received a call from insurance asking about a claim for 09/09/21, the diagnosis code was put in incorrectly it was put in for Allergic Rhinitis instead of Moderate Persistent Asthma without complication for the patients Dupixent injection. I informed the insurance company that I changed the diagnosis to the correct one. Im not sure if you need to refile the claim with the correct diagnosis but I did want you to be aware.  ?

## 2021-09-30 ENCOUNTER — Encounter: Payer: Self-pay | Admitting: Gastroenterology

## 2021-10-07 NOTE — Telephone Encounter (Signed)
Patient's insurance called back to get an update on when the claim we be refilled? ? ?Thanks  ? ?

## 2021-10-08 ENCOUNTER — Ambulatory Visit (INDEPENDENT_AMBULATORY_CARE_PROVIDER_SITE_OTHER): Payer: BLUE CROSS/BLUE SHIELD

## 2021-10-08 ENCOUNTER — Ambulatory Visit: Payer: Self-pay

## 2021-10-08 DIAGNOSIS — J4540 Moderate persistent asthma, uncomplicated: Secondary | ICD-10-CM

## 2021-10-08 NOTE — Addendum Note (Signed)
Addended by: Carin Hock on: 10/08/2021 03:08 PM ? ? Modules accepted: Orders ? ?

## 2021-10-22 ENCOUNTER — Ambulatory Visit (INDEPENDENT_AMBULATORY_CARE_PROVIDER_SITE_OTHER): Payer: BLUE CROSS/BLUE SHIELD

## 2021-10-22 DIAGNOSIS — J4540 Moderate persistent asthma, uncomplicated: Secondary | ICD-10-CM | POA: Diagnosis not present

## 2021-11-03 LAB — HM DIABETES EYE EXAM

## 2021-11-05 ENCOUNTER — Ambulatory Visit (INDEPENDENT_AMBULATORY_CARE_PROVIDER_SITE_OTHER): Payer: BLUE CROSS/BLUE SHIELD

## 2021-11-05 DIAGNOSIS — J4540 Moderate persistent asthma, uncomplicated: Secondary | ICD-10-CM

## 2021-11-18 ENCOUNTER — Ambulatory Visit (INDEPENDENT_AMBULATORY_CARE_PROVIDER_SITE_OTHER): Payer: BLUE CROSS/BLUE SHIELD

## 2021-11-18 DIAGNOSIS — J4540 Moderate persistent asthma, uncomplicated: Secondary | ICD-10-CM

## 2021-11-27 NOTE — Progress Notes (Unsigned)
[facility] [address] [place], [place] [address] [phone]  Date:  12/01/2021   Name:  Ashley Villarreal   DOB:  [birth year]   MRN:  [id number]  PCP:  Darreld Mclean, MD    Chief Complaint: No chief complaint on file.   History of Present Illness:  Ashley Villarreal is a 66 y.o. very pleasant female patient who presents with the following:  Patient seen today for 29-month follow-up Most recent visit with myself was in December History of hypertension, asthma, sleep apnea, well controlled diabetes, hyperlipidemia, chronic kidney disease, vitamin D deficiency She also sees allergy/ asthma (Kozlow)- she is doing immunotherapy  No longer seeing nephology- she was released from care   At her last visit she had lost a few pounds, she was doing quite a bit of walking and monitoring her diet. Her lab work in December looked good, stable mildly reduced kidney function  Colon cancer screening appears to be due Update foot exam Pap due for update this fall, can update today if she would like  Amlodipine/valsartan Crestor Protonix Singulair Metformin 500 daily Duxipent injection Lab Results  Component Value Date   HGBA1C 6.9 (H) 06/02/2021     Patient Active Problem List   Diagnosis Date Noted   Osteopenia 06/05/2021   CKD (chronic kidney disease) stage 3, GFR 30-59 ml/min (HCC) 04/13/2018   Gastroesophageal reflux disease without esophagitis 06/09/2017   Anosmia 06/09/2017   Asthma, severe persistent, well-controlled 06/12/2015   LPRD (laryngopharyngeal reflux disease) 06/12/2015   Allergic rhinoconjunctivitis 06/12/2015   Allergy with anaphylaxis due to food 06/12/2015   Sleep apnea, obstructive    Hyperlipemia 08/10/2011   Hypertensive heart disease    Diabetes mellitus type 2, noninsulin dependent ([place])     Past Medical History:  Diagnosis Date   Allergy    Asthma    COPD (chronic obstructive  pulmonary disease) ([place])    Diabetes mellitus without complication ([place])    Hypertension    Sleep apnea    uses CPAP    Past Surgical History:  Procedure Laterality Date   COLONOSCOPY     DILATION AND CURETTAGE OF UTERUS     NASAL SINUS SURGERY     TONSILLECTOMY  1975   TYMPANOSTOMY TUBE PLACEMENT      Social History   Tobacco Use   Smoking status: Never   Smokeless tobacco: Never  Vaping Use   Vaping Use: Never used  Substance Use Topics   Alcohol use: No    Alcohol/week: 0.0 standard drinks of alcohol   Drug use: No    Family History  Problem Relation Age of Onset   Stroke Father    Heart disease Father    Diabetes Mother    Heart disease Mother    Stroke Mother    Hyperlipidemia Sister    Hyperlipidemia Brother    Diabetes Paternal Grandmother    Hypertension Brother    Colon cancer Neg Hx    Allergic rhinitis Neg Hx    Angioedema Neg Hx    Asthma Neg Hx    Atopy Neg Hx    Eczema Neg Hx    Immunodeficiency Neg Hx    Urticaria Neg Hx     Allergies  Allergen Reactions   Claritin [Loratadine]     dizziness   Doxycycline Nausea Only   Other Other (See Comments)    Magic mouthwash  Patient get blisters on  her lips    Medication list has been reviewed and updated.  Current Outpatient Medications on File Prior to Visit  Medication Sig Dispense Refill   Accu-Chek Softclix Lancets lancets Check blood sugar once daily 100 each 12   amLODipine-valsartan (EXFORGE) 5-320 MG tablet Take 1 tablet by mouth daily.     blood glucose meter kit and supplies Accucheck test strips and meter. Use up to four times daily as directed. (FOR ICD-10 E10.9, E11.9). 1 each 0   Cholecalciferol (VITAMIN D3) 1.25 MG (50000 UT) CAPS Take 1 weekly for 12 weeks 12 capsule 0   DUPIXENT 300 MG/2ML prefilled syringe INJECT 1 SYRINGE UNDER THE SKIN EVERY OTHER WEEK 4 mL 10   EPINEPHrine (EPIPEN 2-PAK) 0.3 mg/0.3 mL IJ SOAJ injection Inject 0.3 mg into the muscle as needed for  anaphylaxis. 1 each 1   glucose blood (ACCU-CHEK GUIDE) test strip Check blood sugar once daily 50 each 12   Hydroquinone-Sunscreens (ALPHAQUIN HP) 4 % CREA APPLY TO DARK SPOTS DAILY.  3   levalbuterol (XOPENEX HFA) 45 MCG/ACT inhaler TAKE 2 PUFFS BY MOUTH EVERY 6 HOURS AS NEEDED FOR WHEEZE 15 each 1   metFORMIN (GLUCOPHAGE) 500 MG tablet Take 1 tablet (500 mg total) by mouth daily. 90 tablet 3   mometasone (NASONEX) 50 MCG/ACT nasal spray Place 1-2 sprays into the nose 2 (two) times daily. 17 g 5   montelukast (SINGULAIR) 10 MG tablet TAKE ONE TABLET ONCE DAILY AS DIRECTED. 90 tablet 3   NASAL SALINE NA Place 1 spray into the nose daily. Neilmed Nasal Saline Rinse to use 1-2 times daily.     pantoprazole (PROTONIX) 40 MG tablet Take 1 tablet (40 mg total) by mouth daily. 90 tablet 3   rosuvastatin (CRESTOR) 20 MG tablet TAKE 1 TABLET BY MOUTH EVERY DAY 90 tablet 3   Current Facility-Administered Medications on File Prior to Visit  Medication Dose Route Frequency Provider Last Rate Last Admin   dupilumab (DUPIXENT) prefilled syringe 300 mg  300 mg Subcutaneous Q14 Days Kozlow, Donnamarie Poag, MD   300 mg at 11/18/21 1313    Review of Systems:  As per HPI- otherwise negative.   Physical Examination: There were no vitals filed for this visit. There were no vitals filed for this visit. There is no height or weight on file to calculate BMI. Ideal Body Weight:    GEN: no acute distress. HEENT: Atraumatic, Normocephalic.  Ears and Nose: No external deformity. CV: RRR, No M/G/R. No JVD. No thrill. No extra heart sounds. PULM: CTA B, no wheezes, crackles, rhonchi. No retractions. No resp. distress. No accessory muscle use. ABD: S, NT, ND, +BS. No rebound. No HSM. EXTR: No c/c/e PSYCH: Normally interactive. Conversant.    Assessment and Plan: ***  Signed Lamar Blinks, MD

## 2021-11-28 ENCOUNTER — Other Ambulatory Visit: Payer: Self-pay | Admitting: Allergy and Immunology

## 2021-11-28 NOTE — Telephone Encounter (Signed)
Please advise to pulmicort flexhaler

## 2021-12-01 ENCOUNTER — Ambulatory Visit (INDEPENDENT_AMBULATORY_CARE_PROVIDER_SITE_OTHER): Payer: BLUE CROSS/BLUE SHIELD | Admitting: Family Medicine

## 2021-12-01 ENCOUNTER — Encounter: Payer: Self-pay | Admitting: Family Medicine

## 2021-12-01 VITALS — BP 140/78 | HR 79 | Temp 98.0°F | Resp 18 | Ht 69.0 in | Wt 158.0 lb

## 2021-12-01 DIAGNOSIS — D649 Anemia, unspecified: Secondary | ICD-10-CM

## 2021-12-01 DIAGNOSIS — E1121 Type 2 diabetes mellitus with diabetic nephropathy: Secondary | ICD-10-CM

## 2021-12-01 DIAGNOSIS — E785 Hyperlipidemia, unspecified: Secondary | ICD-10-CM

## 2021-12-01 DIAGNOSIS — J4550 Severe persistent asthma, uncomplicated: Secondary | ICD-10-CM

## 2021-12-01 DIAGNOSIS — N1831 Chronic kidney disease, stage 3a: Secondary | ICD-10-CM

## 2021-12-01 DIAGNOSIS — I10 Essential (primary) hypertension: Secondary | ICD-10-CM

## 2021-12-01 LAB — CBC
HCT: 34 % — ABNORMAL LOW (ref 36.0–46.0)
Hemoglobin: 11 g/dL — ABNORMAL LOW (ref 12.0–15.0)
MCHC: 32.5 g/dL (ref 30.0–36.0)
MCV: 81.2 fl (ref 78.0–100.0)
Platelets: 215 10*3/uL (ref 150.0–400.0)
RBC: 4.18 Mil/uL (ref 3.87–5.11)
RDW: 15.1 % (ref 11.5–15.5)
WBC: 8.7 10*3/uL (ref 4.0–10.5)

## 2021-12-01 LAB — BASIC METABOLIC PANEL
BUN: 20 mg/dL (ref 6–23)
CO2: 27 mEq/L (ref 19–32)
Calcium: 9.7 mg/dL (ref 8.4–10.5)
Chloride: 104 mEq/L (ref 96–112)
Creatinine, Ser: 1.15 mg/dL (ref 0.40–1.20)
GFR: 50.29 mL/min — ABNORMAL LOW (ref >60.00)
Glucose, Bld: 96 mg/dL (ref 70–99)
Potassium: 4 mEq/L (ref 3.5–5.1)
Sodium: 141 mEq/L (ref 135–145)

## 2021-12-01 LAB — HEMOGLOBIN A1C: Hgb A1c MFr Bld: 6.9 % — ABNORMAL HIGH (ref 4.6–6.5)

## 2021-12-01 MED ORDER — METFORMIN HCL 500 MG PO TABS: 500.0000 mg | ORAL_TABLET | Freq: Every day | ORAL | 3 refills | Status: DC

## 2021-12-01 NOTE — Patient Instructions (Signed)
Great to see you today- keep up the good work!  I will be in touch with your labs Please see me in 6 months assuming al is well

## 2021-12-01 NOTE — Telephone Encounter (Signed)
PULMICORT FLEXHALER is covered where the qvar redihaler is not covered

## 2021-12-01 NOTE — Addendum Note (Signed)
Addended by: Lamar Blinks C on: 12/01/2021 06:16 PM   Modules accepted: Orders

## 2021-12-02 ENCOUNTER — Ambulatory Visit: Payer: BLUE CROSS/BLUE SHIELD

## 2021-12-02 ENCOUNTER — Telehealth: Payer: Self-pay | Admitting: Allergy and Immunology

## 2021-12-02 NOTE — Telephone Encounter (Signed)
This is being addressed in the encounter on 11/28/2021

## 2021-12-02 NOTE — Telephone Encounter (Signed)
Pt hasnt used  qvar in the last year, as she has not had any issues

## 2021-12-02 NOTE — Telephone Encounter (Signed)
Spoken to patient and she is okay with the change to pulmicort.  Okay we send it to pharmacy?

## 2021-12-02 NOTE — Telephone Encounter (Signed)
Patient called and said that she needs a refill on her Qvar called into [facility] on [address]. [phone].

## 2021-12-04 NOTE — Telephone Encounter (Signed)
Flovent and pulmicort are preferred on formulary

## 2021-12-04 NOTE — Telephone Encounter (Signed)
The pharmacy is requesting to change pt from qvar to pulmicort

## 2021-12-05 ENCOUNTER — Ambulatory Visit (INDEPENDENT_AMBULATORY_CARE_PROVIDER_SITE_OTHER): Payer: BLUE CROSS/BLUE SHIELD | Admitting: *Deleted

## 2021-12-05 DIAGNOSIS — J4540 Moderate persistent asthma, uncomplicated: Secondary | ICD-10-CM | POA: Diagnosis not present

## 2021-12-17 ENCOUNTER — Ambulatory Visit (INDEPENDENT_AMBULATORY_CARE_PROVIDER_SITE_OTHER): Payer: BLUE CROSS/BLUE SHIELD

## 2021-12-17 DIAGNOSIS — J4540 Moderate persistent asthma, uncomplicated: Secondary | ICD-10-CM | POA: Diagnosis not present

## 2021-12-28 ENCOUNTER — Other Ambulatory Visit: Payer: Self-pay | Admitting: Family Medicine

## 2021-12-28 DIAGNOSIS — E1121 Type 2 diabetes mellitus with diabetic nephropathy: Secondary | ICD-10-CM

## 2022-01-01 ENCOUNTER — Ambulatory Visit (INDEPENDENT_AMBULATORY_CARE_PROVIDER_SITE_OTHER): Payer: BLUE CROSS/BLUE SHIELD

## 2022-01-01 DIAGNOSIS — J4540 Moderate persistent asthma, uncomplicated: Secondary | ICD-10-CM | POA: Diagnosis not present

## 2022-01-15 ENCOUNTER — Ambulatory Visit (INDEPENDENT_AMBULATORY_CARE_PROVIDER_SITE_OTHER): Payer: BLUE CROSS/BLUE SHIELD

## 2022-01-15 DIAGNOSIS — J4540 Moderate persistent asthma, uncomplicated: Secondary | ICD-10-CM

## 2022-01-18 ENCOUNTER — Encounter: Payer: Self-pay | Admitting: Internal Medicine

## 2022-01-27 ENCOUNTER — Ambulatory Visit: Payer: BLUE CROSS/BLUE SHIELD

## 2022-01-28 ENCOUNTER — Ambulatory Visit (INDEPENDENT_AMBULATORY_CARE_PROVIDER_SITE_OTHER): Payer: BLUE CROSS/BLUE SHIELD

## 2022-01-28 ENCOUNTER — Ambulatory Visit: Payer: BLUE CROSS/BLUE SHIELD | Admitting: Internal Medicine

## 2022-01-28 DIAGNOSIS — J4540 Moderate persistent asthma, uncomplicated: Secondary | ICD-10-CM | POA: Diagnosis not present

## 2022-01-28 NOTE — Progress Notes (Signed)
HPI female never smoker followed for OSA, complicated by HBP, asthma/Xolair (Dr. Neldon Mc) NPSG 02/11/15- AHI 12.5 per hour, CPAP to 9   --------------------------------------------------------------------------------  01/28/21- 66 year old female never smoker followed for OSA, complicated by HBP, asthma/ Dupixent (Dr. Neldon Mc), DM2, GERD, CKD3,  CPAP 8/[employer] Download- compliance 99%, AHI 4.4/ hr Body weight today-159 lbs Covid vax-3 Phizer She is clear that she is better off with CPAP.  Denies acute concerns.  Asthma is being managed by her allergist.  Her brother is now a patient of ours.  01/29/22- 66 year old female never smoker followed for OSA, complicated by HBP, asthma/ Dupixent (Dr. Neldon Mc), DM2, GERD, CKD3,  CPAP 5-15/[employer] Download- compliance -100%, AHI 4.5/ hr Body weight today- 158 lbs Covid vax-3 Phizer Download reviewed. Some mask leak with nasal pillows. Allergy control of rhinitis is doing well- Dr Kozlow/ Dupixent.    ROS-see HPI   + = positive Constitutional:    weight loss, night sweats, fevers, chills, fatigue, lassitude. HEENT:    headaches, difficulty swallowing, tooth/dental problems, sore throat,       sneezing, itching, ear ache, + nasal congestion, post nasal drip, snoring CV:    chest pain, orthopnea, PND, swelling in lower extremities, anasarca,                                                      dizziness, palpitations Resp:   shortness of breath with exertion or at rest.                productive cough,   non-productive cough, coughing up of blood.              change in color of mucus.  wheezing.   Skin:    rash or lesions. GI:  No-   heartburn, indigestion, abdominal pain, nausea, vomiting,  GU: . MS:   joint pain, stiffness, decreased range of motion, back pain. Neuro-     nothing unusual Psych:  change in mood or affect.  depression or anxiety.   memory loss.  OBJ- Physical Exam General- Alert, Oriented, Affect-appropriate, Distress- none acute,  normal weight/ trim fit appearing Skin- rash-none, lesions- none, excoriation- none Lymphadenopathy- none Head- atraumatic            Eyes- Gross vision intact, PERRLA, conjunctivae and secretions clear            Ears- Hearing, canals-normal            Nose- + turbinate edema, no-Septal dev, mucus, polyps, erosion, perforation             Throat- Mallampati III-IV , mucosa clear , drainage- none, tonsils- atrophic Neck- flexible , trachea midline, no stridor , thyroid nl, carotid no bruit Chest - symmetrical excursion , unlabored           Heart/CV- RRR , no murmur , no gallop  , no rub, nl s1 s2                           - JVD- none , edema- none, stasis changes- none, varices- none           Lung- clear to P&A, wheeze- none, cough- none , dullness-none, rub- none           Chest wall-  Abd-  Br/  Gen/ Rectal- Not done, not indicated Extrem- cyanosis- none, clubbing, none, atrophy- none, strength- nl Neuro- grossly intact to observation

## 2022-01-29 ENCOUNTER — Ambulatory Visit: Payer: BLUE CROSS/BLUE SHIELD | Admitting: Internal Medicine

## 2022-01-29 ENCOUNTER — Encounter: Payer: Self-pay | Admitting: Internal Medicine

## 2022-01-29 VITALS — BP 120/68 | HR 75 | Ht 68.0 in | Wt 158.8 lb

## 2022-01-29 DIAGNOSIS — J339 Nasal polyp, unspecified: Secondary | ICD-10-CM | POA: Diagnosis not present

## 2022-01-29 DIAGNOSIS — G4733 Obstructive sleep apnea (adult) (pediatric): Secondary | ICD-10-CM | POA: Diagnosis not present

## 2022-01-29 NOTE — Patient Instructions (Signed)
Order- DME [employer]- please change autopap range to 5-12, continue mask of choice, humidifier, supplies, AirView/ card  Please call if we can help

## 2022-02-12 ENCOUNTER — Ambulatory Visit (INDEPENDENT_AMBULATORY_CARE_PROVIDER_SITE_OTHER): Payer: BLUE CROSS/BLUE SHIELD

## 2022-02-12 DIAGNOSIS — J4540 Moderate persistent asthma, uncomplicated: Secondary | ICD-10-CM

## 2022-02-17 ENCOUNTER — Telehealth: Payer: Self-pay

## 2022-02-17 NOTE — Telephone Encounter (Signed)
[facility] called to confirm delivery date for Dupixent. Scheduled to be delivered 02/25/22 patient's appointment for next shot is 03/03/22.

## 2022-02-24 ENCOUNTER — Encounter: Payer: Self-pay | Admitting: Internal Medicine

## 2022-02-24 NOTE — Assessment & Plan Note (Signed)
Allergist is treating with Dupixent

## 2022-02-24 NOTE — Assessment & Plan Note (Signed)
Benefits from CPAP.  Plan- try to reduce leak by reducing pressure to auto 5-12

## 2022-03-03 ENCOUNTER — Ambulatory Visit (INDEPENDENT_AMBULATORY_CARE_PROVIDER_SITE_OTHER): Payer: BLUE CROSS/BLUE SHIELD | Admitting: *Deleted

## 2022-03-03 DIAGNOSIS — J4540 Moderate persistent asthma, uncomplicated: Secondary | ICD-10-CM | POA: Diagnosis not present

## 2022-03-16 ENCOUNTER — Ambulatory Visit: Payer: BLUE CROSS/BLUE SHIELD

## 2022-03-17 ENCOUNTER — Ambulatory Visit (INDEPENDENT_AMBULATORY_CARE_PROVIDER_SITE_OTHER): Payer: BLUE CROSS/BLUE SHIELD | Admitting: *Deleted

## 2022-03-17 DIAGNOSIS — J4540 Moderate persistent asthma, uncomplicated: Secondary | ICD-10-CM

## 2022-03-25 ENCOUNTER — Other Ambulatory Visit: Payer: Self-pay | Admitting: Allergy and Immunology

## 2022-04-01 ENCOUNTER — Ambulatory Visit: Payer: BLUE CROSS/BLUE SHIELD

## 2022-04-06 ENCOUNTER — Ambulatory Visit (INDEPENDENT_AMBULATORY_CARE_PROVIDER_SITE_OTHER): Payer: Medicare Other | Admitting: *Deleted

## 2022-04-06 DIAGNOSIS — J4540 Moderate persistent asthma, uncomplicated: Secondary | ICD-10-CM

## 2022-04-14 ENCOUNTER — Ambulatory Visit (INDEPENDENT_AMBULATORY_CARE_PROVIDER_SITE_OTHER): Payer: Medicare Other

## 2022-04-14 DIAGNOSIS — Z23 Encounter for immunization: Secondary | ICD-10-CM | POA: Diagnosis not present

## 2022-04-20 ENCOUNTER — Ambulatory Visit (INDEPENDENT_AMBULATORY_CARE_PROVIDER_SITE_OTHER): Payer: Medicare Other

## 2022-04-20 DIAGNOSIS — J4540 Moderate persistent asthma, uncomplicated: Secondary | ICD-10-CM

## 2022-04-22 DIAGNOSIS — H903 Sensorineural hearing loss, bilateral: Secondary | ICD-10-CM | POA: Diagnosis not present

## 2022-04-23 ENCOUNTER — Other Ambulatory Visit: Payer: Self-pay | Admitting: *Deleted

## 2022-04-23 ENCOUNTER — Encounter: Payer: Self-pay | Admitting: Family Medicine

## 2022-04-23 MED ORDER — DUPIXENT 300 MG/2ML ~~LOC~~ SOSY: PREFILLED_SYRINGE | SUBCUTANEOUS | 11 refills | Status: DC

## 2022-05-04 ENCOUNTER — Ambulatory Visit (INDEPENDENT_AMBULATORY_CARE_PROVIDER_SITE_OTHER): Payer: Medicare Other | Admitting: *Deleted

## 2022-05-04 DIAGNOSIS — J4540 Moderate persistent asthma, uncomplicated: Secondary | ICD-10-CM

## 2022-05-21 ENCOUNTER — Ambulatory Visit (INDEPENDENT_AMBULATORY_CARE_PROVIDER_SITE_OTHER): Payer: Medicare Other

## 2022-05-21 DIAGNOSIS — J4540 Moderate persistent asthma, uncomplicated: Secondary | ICD-10-CM | POA: Diagnosis not present

## 2022-05-29 NOTE — Patient Instructions (Incomplete)
It was great to see you again today, I will be in touch with your labs Recommend latest COVID booster if not done already, also consider dose of RSV

## 2022-05-29 NOTE — Progress Notes (Unsigned)
[facility] [address] [place], [place] [address] [phone]  Date:  06/03/2022   Name:  Ashley Villarreal   DOB:  Aug 01, 1955   MRN:  [id number]  PCP:  Darreld Mclean, MD    Chief Complaint: No chief complaint on file.   History of Present Illness:  Ashley Villarreal is a 66 y.o. very pleasant female patient who presents with the following:  Patient seen today for 57-monthfollow-up visit Most recent visit with myself was in June of this year  History of hypertension, asthma, sleep apnea, well controlled diabetes, hyperlipidemia, chronic kidney disease, vitamin D deficiency  At her last visit she reported nephrology had released her from follow-up  She enjoys walking for exercise, typically does several miles a day Colonoscopy is due in 2025 Pap smear Needs urine microalbumin COVID booster Can suggest RSV  Lab Results  Component Value Date   HGBA1C 6.9 (H) 12/01/2021     Patient Active Problem List   Diagnosis Date Noted   Otorrhagia, left 06/30/2021   Granuloma of tympanic membrane of left ear 06/30/2021   Osteopenia 06/05/2021   Myringotomy tube status 06/27/2018   CKD (chronic kidney disease) stage 3, GFR 30-59 ml/min (HCC) 04/13/2018   Chronic mucoid otitis media of left ear 03/11/2018   Chronic pansinusitis 01/07/2018   Nasal polyposis 01/07/2018   Bilateral sensorineural hearing loss 12/08/2017   Gastroesophageal reflux disease without esophagitis 06/09/2017   Anosmia 06/09/2017   Asthma, severe persistent, well-controlled 06/12/2015   LPRD (laryngopharyngeal reflux disease) 06/12/2015   Allergic rhinoconjunctivitis 06/12/2015   Allergy with anaphylaxis due to food 06/12/2015   OSA (obstructive sleep apnea)    Uterine leiomyoma 11/08/2011   Hyperlipemia 08/10/2011   Hypertensive heart disease    Diabetes mellitus type 2, noninsulin dependent ([place]     Past Medical History:  Diagnosis  Date   Allergy    Asthma    COPD (chronic obstructive pulmonary disease) ([facility]    Diabetes mellitus without complication ([facility]    Hypertension    Sleep apnea    uses CPAP    Past Surgical History:  Procedure Laterality Date   COLONOSCOPY     DILATION AND CURETTAGE OF UTERUS     NASAL SINUS SURGERY     TONSILLECTOMY  1975   TYMPANOSTOMY TUBE PLACEMENT      Social History   Tobacco Use   Smoking status: Never   Smokeless tobacco: Never  Vaping Use   Vaping Use: Never used  Substance Use Topics   Alcohol use: No    Alcohol/week: 0.0 standard drinks of alcohol   Drug use: No    Family History  Problem Relation Age of Onset   Stroke Father    Heart disease Father    Diabetes Mother    Heart disease Mother    Stroke Mother    Hyperlipidemia Sister    Hyperlipidemia Brother    Diabetes Paternal Grandmother    Hypertension Brother    Colon cancer Neg Hx    Allergic rhinitis Neg Hx    Angioedema Neg Hx    Asthma Neg Hx    Atopy Neg Hx    Eczema Neg Hx    Immunodeficiency Neg Hx    Urticaria Neg Hx     Allergies  Allergen Reactions   Claritin [Loratadine]     dizziness   Doxycycline Nausea Only   Other Other (See Comments)  Magic mouthwash  Patient get blisters on her lips    Medication list has been reviewed and updated.  Current Outpatient Medications on File Prior to Visit  Medication Sig Dispense Refill   Accu-Chek Softclix Lancets lancets Check blood sugar once daily 100 each 12   amLODipine-valsartan (EXFORGE) 5-320 MG tablet Take 1 tablet by mouth daily.     blood glucose meter kit and supplies Accucheck test strips and meter. Use up to four times daily as directed. (FOR ICD-10 E10.9, E11.9). 1 each 0   Cholecalciferol (VITAMIN D3) 1.25 MG (50000 UT) CAPS Take 1 weekly for 12 weeks 12 capsule 0   dupilumab (DUPIXENT) 300 MG/2ML prefilled syringe INJECT 1 SYRINGE UNDER THE SKIN EVERY OTHER WEEK 4 mL 11   EPINEPHrine (EPIPEN 2-PAK) 0.3 mg/0.3 mL IJ  SOAJ injection Inject 0.3 mg into the muscle as needed for anaphylaxis. 1 each 1   FLOVENT HFA 110 MCG/ACT inhaler Inhale 2 puffs into the lungs 2 (two) times daily. 1 each 5   glucose blood (ACCU-CHEK GUIDE) test strip USE TO CHECK BLOOD SUGAR ONCE DAILY 50 strip 12   Hydroquinone-Sunscreens (ALPHAQUIN HP) 4 % CREA APPLY TO DARK SPOTS DAILY.  3   levalbuterol (XOPENEX HFA) 45 MCG/ACT inhaler TAKE 2 PUFFS BY MOUTH EVERY 6 HOURS AS NEEDED FOR WHEEZE 15 each 1   metFORMIN (GLUCOPHAGE) 500 MG tablet Take 1 tablet (500 mg total) by mouth daily. 90 tablet 3   mometasone (NASONEX) 50 MCG/ACT nasal spray Place 1-2 sprays into the nose 2 (two) times daily. 17 g 5   NASAL SALINE NA Place 1 spray into the nose daily. Neilmed Nasal Saline Rinse to use 1-2 times daily.     pantoprazole (PROTONIX) 40 MG tablet Take 1 tablet (40 mg total) by mouth daily. 90 tablet 3   rosuvastatin (CRESTOR) 20 MG tablet TAKE 1 TABLET BY MOUTH EVERY DAY 90 tablet 3   Current Facility-Administered Medications on File Prior to Visit  Medication Dose Route Frequency Provider Last Rate Last Admin   dupilumab (DUPIXENT) prefilled syringe 300 mg  300 mg Subcutaneous Q14 Days Kozlow, Donnamarie Poag, MD   300 mg at 05/21/22 1815    Review of Systems:  As per HPI- otherwise negative.   Physical Examination: There were no vitals filed for this visit. There were no vitals filed for this visit. There is no height or weight on file to calculate BMI. Ideal Body Weight:    GEN: no acute distress. HEENT: Atraumatic, Normocephalic.  Ears and Nose: No external deformity. CV: RRR, No M/G/R. No JVD. No thrill. No extra heart sounds. PULM: CTA B, no wheezes, crackles, rhonchi. No retractions. No resp. distress. No accessory muscle use. ABD: S, NT, ND, +BS. No rebound. No HSM. EXTR: No c/c/e PSYCH: Normally interactive. Conversant.    Assessment and Plan: ***  Signed Lamar Blinks, MD

## 2022-06-01 ENCOUNTER — Ambulatory Visit (INDEPENDENT_AMBULATORY_CARE_PROVIDER_SITE_OTHER): Payer: Medicare Other

## 2022-06-01 DIAGNOSIS — J4540 Moderate persistent asthma, uncomplicated: Secondary | ICD-10-CM | POA: Diagnosis not present

## 2022-06-03 ENCOUNTER — Ambulatory Visit (INDEPENDENT_AMBULATORY_CARE_PROVIDER_SITE_OTHER): Payer: Medicare Other | Admitting: Family Medicine

## 2022-06-03 ENCOUNTER — Encounter: Payer: Self-pay | Admitting: Family Medicine

## 2022-06-03 ENCOUNTER — Other Ambulatory Visit (HOSPITAL_COMMUNITY)
Admission: RE | Admit: 2022-06-03 | Discharge: 2022-06-03 | Disposition: A | Discharge status: Home / Self Care | Payer: Medicare Other | Source: Ambulatory Visit | Attending: Family Medicine | Admitting: Family Medicine

## 2022-06-03 VITALS — BP 136/60 | HR 77 | Temp 97.7°F | Resp 18 | Ht 69.0 in | Wt 161.2 lb

## 2022-06-03 DIAGNOSIS — E785 Hyperlipidemia, unspecified: Secondary | ICD-10-CM | POA: Diagnosis not present

## 2022-06-03 DIAGNOSIS — Z1329 Encounter for screening for other suspected endocrine disorder: Secondary | ICD-10-CM | POA: Diagnosis not present

## 2022-06-03 DIAGNOSIS — D649 Anemia, unspecified: Secondary | ICD-10-CM | POA: Diagnosis not present

## 2022-06-03 DIAGNOSIS — R5383 Other fatigue: Secondary | ICD-10-CM | POA: Diagnosis not present

## 2022-06-03 DIAGNOSIS — M79604 Pain in right leg: Secondary | ICD-10-CM

## 2022-06-03 DIAGNOSIS — E1121 Type 2 diabetes mellitus with diabetic nephropathy: Secondary | ICD-10-CM

## 2022-06-03 DIAGNOSIS — I10 Essential (primary) hypertension: Secondary | ICD-10-CM | POA: Diagnosis not present

## 2022-06-03 DIAGNOSIS — Z01419 Encounter for gynecological examination (general) (routine) without abnormal findings: Secondary | ICD-10-CM | POA: Insufficient documentation

## 2022-06-03 DIAGNOSIS — Z1151 Encounter for screening for human papillomavirus (HPV): Secondary | ICD-10-CM | POA: Diagnosis not present

## 2022-06-03 DIAGNOSIS — Z124 Encounter for screening for malignant neoplasm of cervix: Secondary | ICD-10-CM

## 2022-06-03 LAB — COMPREHENSIVE METABOLIC PANEL
ALT: 32 U/L (ref 0–35)
AST: 31 U/L (ref 0–37)
Albumin: 4.4 g/dL (ref 3.5–5.2)
Alkaline Phosphatase: 74 U/L (ref 39–117)
BUN: 18 mg/dL (ref 6–23)
CO2: 30 mEq/L (ref 19–32)
Calcium: 9.9 mg/dL (ref 8.4–10.5)
Chloride: 101 mEq/L (ref 96–112)
Creatinine, Ser: 1.09 mg/dL (ref 0.40–1.20)
GFR: 53.44 mL/min — ABNORMAL LOW (ref >60.00)
Glucose, Bld: 99 mg/dL (ref 70–99)
Potassium: 3.8 mEq/L (ref 3.5–5.1)
Sodium: 140 mEq/L (ref 135–145)
Total Bilirubin: 0.5 mg/dL (ref 0.2–1.2)
Total Protein: 7.3 g/dL (ref 6.0–8.3)

## 2022-06-03 LAB — MICROALBUMIN / CREATININE URINE RATIO
Creatinine,U: 57.4 mg/dL
Microalb Creat Ratio: 49.4 mg/g — ABNORMAL HIGH (ref 0.0–30.0)
Microalb, Ur: 28.4 mg/dL — ABNORMAL HIGH (ref 0.0–1.9)

## 2022-06-03 LAB — LIPID PANEL
Cholesterol: 173 mg/dL (ref 0–200)
HDL: 51.1 mg/dL (ref >39.00)
LDL Cholesterol: 104 mg/dL — ABNORMAL HIGH (ref 0–99)
NonHDL: 121.62
Total CHOL/HDL Ratio: 3
Triglycerides: 88 mg/dL (ref 0.0–149.0)
VLDL: 17.6 mg/dL (ref 0.0–40.0)

## 2022-06-03 LAB — CBC
HCT: 36.2 % (ref 36.0–46.0)
Hemoglobin: 11.7 g/dL — ABNORMAL LOW (ref 12.0–15.0)
MCHC: 32.4 g/dL (ref 30.0–36.0)
MCV: 81 fl (ref 78.0–100.0)
Platelets: 236 10*3/uL (ref 150.0–400.0)
RBC: 4.47 Mil/uL (ref 3.87–5.11)
RDW: 15.3 % (ref 11.5–15.5)
WBC: 7.2 10*3/uL (ref 4.0–10.5)

## 2022-06-03 LAB — VITAMIN D 25 HYDROXY (VIT D DEFICIENCY, FRACTURES): VITD: 54.17 ng/mL (ref 30.00–100.00)

## 2022-06-03 LAB — HEMOGLOBIN A1C: Hgb A1c MFr Bld: 7.5 % — ABNORMAL HIGH (ref 4.6–6.5)

## 2022-06-03 LAB — TSH: TSH: 1.13 u[IU]/mL (ref 0.35–5.50)

## 2022-06-03 MED ORDER — ROSUVASTATIN CALCIUM 20 MG PO TABS: 20.0000 mg | ORAL_TABLET | Freq: Every day | ORAL | 3 refills | Status: DC

## 2022-06-05 ENCOUNTER — Ambulatory Visit (HOSPITAL_BASED_OUTPATIENT_CLINIC_OR_DEPARTMENT_OTHER)
Admission: RE | Admit: 2022-06-05 | Discharge: 2022-06-05 | Disposition: A | Discharge status: Home / Self Care | Payer: Medicare Other | Source: Ambulatory Visit | Attending: Family Medicine | Admitting: Family Medicine

## 2022-06-05 ENCOUNTER — Encounter: Payer: Self-pay | Admitting: Family Medicine

## 2022-06-05 DIAGNOSIS — E1121 Type 2 diabetes mellitus with diabetic nephropathy: Secondary | ICD-10-CM

## 2022-06-05 DIAGNOSIS — E785 Hyperlipidemia, unspecified: Secondary | ICD-10-CM | POA: Insufficient documentation

## 2022-06-05 LAB — CYTOLOGY - PAP
Comment: NEGATIVE
Diagnosis: NEGATIVE
High risk HPV: NEGATIVE

## 2022-06-07 ENCOUNTER — Encounter: Payer: Self-pay | Admitting: Family Medicine

## 2022-06-08 ENCOUNTER — Other Ambulatory Visit (HOSPITAL_BASED_OUTPATIENT_CLINIC_OR_DEPARTMENT_OTHER): Payer: Self-pay

## 2022-06-08 MED ORDER — COMIRNATY 30 MCG/0.3ML IM SUSY
PREFILLED_SYRINGE | INTRAMUSCULAR | 0 refills | Status: DC
  Filled 2022-06-08: qty 0.3, 1d supply, fill #0

## 2022-06-09 ENCOUNTER — Encounter: Payer: Self-pay | Admitting: Allergy and Immunology

## 2022-06-09 ENCOUNTER — Other Ambulatory Visit: Payer: Self-pay

## 2022-06-09 ENCOUNTER — Ambulatory Visit: Payer: Medicare Other | Admitting: Allergy and Immunology

## 2022-06-09 VITALS — BP 150/70 | HR 83 | Temp 98.0°F | Resp 18 | Ht 69.0 in | Wt 161.9 lb

## 2022-06-09 DIAGNOSIS — J324 Chronic pansinusitis: Secondary | ICD-10-CM

## 2022-06-09 DIAGNOSIS — J339 Nasal polyp, unspecified: Secondary | ICD-10-CM | POA: Diagnosis not present

## 2022-06-09 DIAGNOSIS — J4550 Severe persistent asthma, uncomplicated: Secondary | ICD-10-CM

## 2022-06-09 DIAGNOSIS — J3089 Other allergic rhinitis: Secondary | ICD-10-CM

## 2022-06-09 DIAGNOSIS — K219 Gastro-esophageal reflux disease without esophagitis: Secondary | ICD-10-CM | POA: Diagnosis not present

## 2022-06-09 MED ORDER — PANTOPRAZOLE SODIUM 40 MG PO TBEC: 40.0000 mg | DELAYED_RELEASE_TABLET | Freq: Every day | ORAL | 3 refills | Status: DC

## 2022-06-09 NOTE — Progress Notes (Unsigned)
Ashley Villarreal   Follow-up Note  Referring Provider: Darreld Mclean, MD Primary Provider: Darreld Mclean, MD Date of Office Visit: 06/09/2022  Subjective:   Ashley Villarreal (DOB: August 11, 1955) is a 66 y.o. female who returns to the [facility] on 06/09/2022 in re-evaluation of the following:  HPI: Ashley Villarreal returns to this clinic in evaluation of severe asthma, chronic sinusitis, nasal polyposis, and reflux.  I last saw her in this clinic 10 June 2021.  She is using dupilumab to treat her severe asthma and upper airway disease which has worked great and she has gone through the entire year without the need for systemic steroid or antibiotic and rare use of a short acting bronchodilator.  The only other additional controller agents she uses besides for dupilumab is a nasal steroid.  However, her insurance company has made the use of her nasal steroid just too expensive as it is now causing her $100 to get generic mometasone.  Her reflux is under very good control with a proton pump inhibitor.  She has obtained a flu vaccine and COVID-vaccine.  Allergies as of 06/09/2022       Reactions   Doxycycline Nausea Only   Loratadine Other (See Comments)   dizziness   Other Other (See Comments)   Magic mouthwash  Patient get blisters on her lips        Medication List    Accu-Chek Guide test strip Generic drug: glucose blood USE TO CHECK BLOOD SUGAR ONCE DAILY   Accu-Chek Softclix Lancets lancets Check blood sugar once daily   Alphaquin HP 4 % Crea APPLY TO DARK SPOTS DAILY.   amLODipine-valsartan 5-320 MG tablet Commonly known as: EXFORGE Take 1 tablet by mouth daily.   blood glucose meter kit and supplies Accucheck test strips and meter. Use up to four times daily as directed. (FOR ICD-10 E10.9, E11.9).   Comirnaty syringe Generic drug: COVID-19 mRNA vaccine 2023-2024 Inject into the muscle.    Dupixent 300 MG/2ML prefilled syringe Generic drug: dupilumab INJECT 1 SYRINGE UNDER THE SKIN EVERY OTHER WEEK   EPINEPHrine 0.3 mg/0.3 mL Soaj injection Commonly known as: EpiPen 2-Pak Inject 0.3 mg into the muscle as needed for anaphylaxis.   Flovent HFA 110 MCG/ACT inhaler Generic drug: fluticasone Inhale 2 puffs into the lungs 2 (two) times daily.   levalbuterol 45 MCG/ACT inhaler Commonly known as: XOPENEX HFA TAKE 2 PUFFS BY MOUTH EVERY 6 HOURS AS NEEDED FOR WHEEZE   metFORMIN 500 MG tablet Commonly known as: GLUCOPHAGE Take 1 tablet (500 mg total) by mouth daily.   mometasone 50 MCG/ACT nasal spray Commonly known as: NASONEX Place 1-2 sprays into the nose 2 (two) times daily.   NASAL SALINE NA Place 1 spray into the nose daily. Neilmed Nasal Saline Rinse to use 1-2 times daily.   pantoprazole 40 MG tablet Commonly known as: PROTONIX Take 1 tablet (40 mg total) by mouth daily.   rosuvastatin 20 MG tablet Commonly known as: CRESTOR Take 1 tablet (20 mg total) by mouth daily.   Vitamin D3 1.25 MG (50000 UT) Caps Take 1 weekly for 12 weeks    Past Medical History:  Diagnosis Date   Allergy    Asthma    COPD (chronic obstructive pulmonary disease) ([place])    Diabetes mellitus without complication ([place])    Hypertension    Sleep apnea    uses CPAP    Past Surgical History:  Procedure Laterality Date   COLONOSCOPY     DILATION AND CURETTAGE OF UTERUS     NASAL SINUS SURGERY     TONSILLECTOMY  1975   TYMPANOSTOMY TUBE PLACEMENT      Review of systems negative except as noted in HPI / PMHx or noted below:  Review of Systems  Constitutional: Negative.   HENT: Negative.    Eyes: Negative.   Respiratory: Negative.    Cardiovascular: Negative.   Gastrointestinal: Negative.   Genitourinary: Negative.   Musculoskeletal: Negative.   Skin: Negative.   Neurological: Negative.   Endo/Heme/Allergies: Negative.   Psychiatric/Behavioral: Negative.        Objective:   Vitals:   06/09/22 1132  BP: (!) 150/70  Pulse: 83  Resp: 18  Temp: 98 F (36.7 C)  SpO2: 99%   Height: 5' 9" (175.3 cm)  Weight: 161 lb 14.4 oz (73.4 kg)   Physical Exam Constitutional:      Appearance: She is not diaphoretic.  HENT:     Head: Normocephalic.     Right Ear: Tympanic membrane, ear canal and external ear normal.     Left Ear: Tympanic membrane, ear canal and external ear normal.     Nose: Nose normal. No mucosal edema or rhinorrhea.     Mouth/Throat:     Pharynx: Uvula midline. No oropharyngeal exudate.  Eyes:     Conjunctiva/sclera: Conjunctivae normal.  Neck:     Thyroid: No thyromegaly.     Trachea: Trachea normal. No tracheal tenderness or tracheal deviation.  Cardiovascular:     Rate and Rhythm: Normal rate and regular rhythm.     Heart sounds: Normal heart sounds, S1 normal and S2 normal. No murmur heard. Pulmonary:     Effort: No respiratory distress.     Breath sounds: Normal breath sounds. No stridor. No wheezing or rales.  Lymphadenopathy:     Head:     Right side of head: No tonsillar adenopathy.     Left side of head: No tonsillar adenopathy.     Cervical: No cervical adenopathy.  Skin:    Findings: No erythema or rash.     Nails: There is no clubbing.  Neurological:     Mental Status: She is alert.     Diagnostics:    Spirometry was performed and demonstrated an FEV1 of 1.75 at 72 % of predicted.  The patient had an Asthma Control Test with the following results: ACT Total Score: 25.    Assessment and Plan:   1. Asthma, severe persistent, well-controlled   2. Other allergic rhinitis   3. Chronic pansinusitis   4. Nasal polyposis   5. Gastroesophageal reflux disease without esophagitis     1.  Start OTC Nasacort - 1-2 sprays each nostril 1-2 times per day  (replaces mometasone)  2.  Continue Dupilumab injections    3.  Continue pantoprazole 40 mg daily  4.  Continue Xopenex HFA if needed  5.   Consider obtaining RSV vaccine   6. Return to clinic in 12 months or earlier if problem  Irianna appears to be doing very well on her current therapy which includes anti-IL-4/IL-13 biologic agent which is really made the most significant impact regarding her multiorgan respiratory tract disease.  She will continue on this agent as well as nasal steroid and she will also address her reflux disease with pantoprazole and we will see her back in this clinic in 1 year or earlier if there is a problem.  Allena Katz, MD Allergy / Immunology [place]

## 2022-06-09 NOTE — Patient Instructions (Signed)
  1.  Start OTC Nasacort - 1-2 sprays each nostril 1-2 times per day  (replaces mometasone)  2.  Continue Dupilumab injections    3.  Continue pantoprazole 40 mg daily  4.  Continue Xopenex HFA if needed  5.  Consider obtaining RSV vaccine   6. Return to clinic in 12 months or earlier if problem

## 2022-06-10 ENCOUNTER — Encounter: Payer: Self-pay | Admitting: Allergy and Immunology

## 2022-06-19 ENCOUNTER — Ambulatory Visit (INDEPENDENT_AMBULATORY_CARE_PROVIDER_SITE_OTHER): Payer: Medicare Other | Admitting: *Deleted

## 2022-06-19 DIAGNOSIS — G4733 Obstructive sleep apnea (adult) (pediatric): Secondary | ICD-10-CM | POA: Diagnosis not present

## 2022-06-19 DIAGNOSIS — J4550 Severe persistent asthma, uncomplicated: Secondary | ICD-10-CM

## 2022-06-23 ENCOUNTER — Other Ambulatory Visit: Payer: Self-pay | Admitting: Family Medicine

## 2022-06-23 DIAGNOSIS — Z1231 Encounter for screening mammogram for malignant neoplasm of breast: Secondary | ICD-10-CM

## 2022-06-24 ENCOUNTER — Ambulatory Visit: Payer: Medicare Other

## 2022-06-29 NOTE — Progress Notes (Unsigned)
   I, Peterson Lombard, LAT, ATC acting as a scribe for Lynne Leader, MD.  Subjective:    CC: R leg pain  HPI: Pt is a 67 y/o female c/ R leg pain x /. Pt enjoying walking for exercise. Pt locates pain to   Low back pain: Aggravates: Treatments tried:  Pertinent review of Systems: ***  Relevant historical information: ***   Objective:   There were no vitals filed for this visit. General: Well Developed, well nourished, and in no acute distress.   MSK: ***  Lab and Radiology Results No results found for this or any previous visit (from the past 72 hour(s)). No results found.    Impression and Recommendations:    Assessment and Plan: 67 y.o. female with ***.  PDMP not reviewed this encounter. No orders of the defined types were placed in this encounter.  No orders of the defined types were placed in this encounter.   Discussed warning signs or symptoms. Please see discharge instructions. Patient expresses understanding.   ***

## 2022-06-30 ENCOUNTER — Ambulatory Visit (INDEPENDENT_AMBULATORY_CARE_PROVIDER_SITE_OTHER): Payer: Medicare Other | Admitting: Family Medicine

## 2022-06-30 ENCOUNTER — Ambulatory Visit (INDEPENDENT_AMBULATORY_CARE_PROVIDER_SITE_OTHER): Payer: Medicare Other

## 2022-06-30 VITALS — BP 138/82 | HR 77 | Ht 69.0 in | Wt 157.0 lb

## 2022-06-30 DIAGNOSIS — G8929 Other chronic pain: Secondary | ICD-10-CM | POA: Diagnosis not present

## 2022-06-30 DIAGNOSIS — M25551 Pain in right hip: Secondary | ICD-10-CM

## 2022-06-30 NOTE — Patient Instructions (Addendum)
Thank you for coming in today.   Please get an Xray today before you leave   Keep me updated.   Use tylenol arthritis.   If the pain worsens we can do more including an injection.   I fear a hip replacement may be in your future.

## 2022-07-01 NOTE — Progress Notes (Signed)
Right hip x-ray shows arthritis rated as medium.  This explains her pain.  Eventually this will get bad enough that an injection makes sense or even a hip replacement.

## 2022-07-03 ENCOUNTER — Ambulatory Visit (INDEPENDENT_AMBULATORY_CARE_PROVIDER_SITE_OTHER): Payer: Medicare Other

## 2022-07-03 DIAGNOSIS — J4550 Severe persistent asthma, uncomplicated: Secondary | ICD-10-CM | POA: Diagnosis not present

## 2022-07-15 ENCOUNTER — Ambulatory Visit (INDEPENDENT_AMBULATORY_CARE_PROVIDER_SITE_OTHER): Payer: Medicare Other

## 2022-07-15 ENCOUNTER — Encounter: Payer: Self-pay | Admitting: Family Medicine

## 2022-07-15 DIAGNOSIS — J4550 Severe persistent asthma, uncomplicated: Secondary | ICD-10-CM | POA: Diagnosis not present

## 2022-07-17 ENCOUNTER — Ambulatory Visit: Payer: Medicare Other

## 2022-07-21 IMAGING — MG MM DIGITAL SCREENING BILAT W/ TOMO AND CAD
8 series · 8 of 24 positions shown · non-contrast
Comparison: Previous exam(s).

CLINICAL DATA: Screening.

EXAM:
DIGITAL SCREENING BILATERAL MAMMOGRAM WITH TOMOSYNTHESIS AND CAD
TECHNIQUE: Bilateral screening digital craniocaudal and mediolateral oblique
mammograms were obtained. Bilateral screening digital breast
tomosynthesis was performed. The images were evaluated with
computer-aided detection.

[L MLO synth-2D]
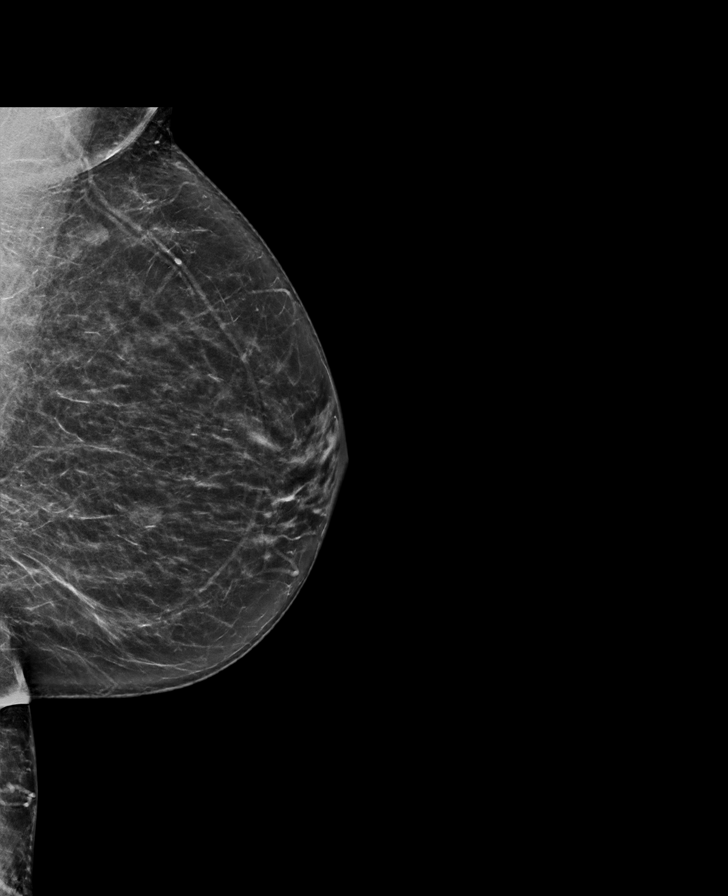

[L CC synth-2D]
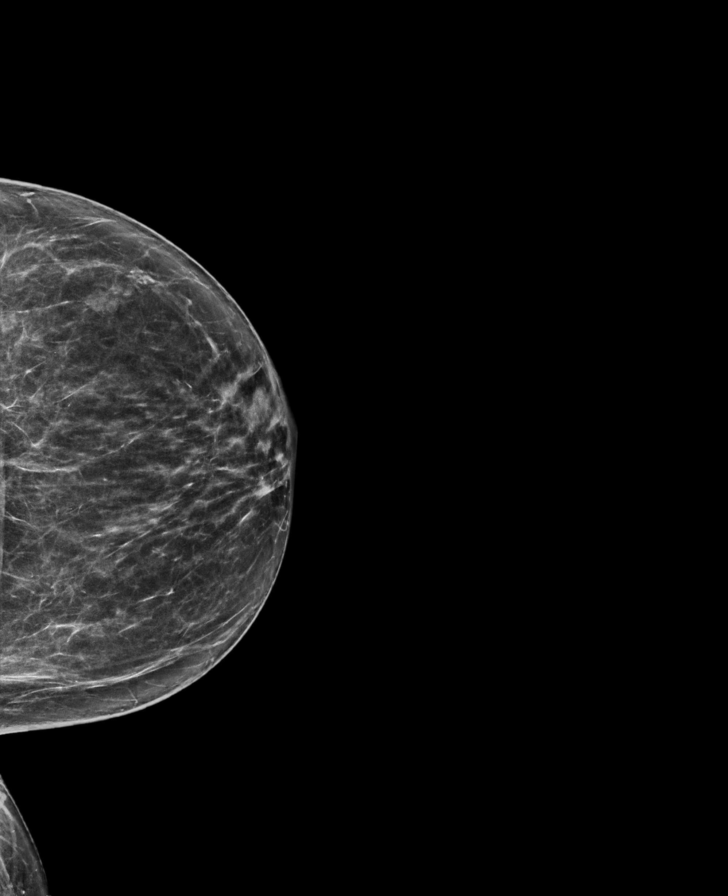

[R CC synth-2D]
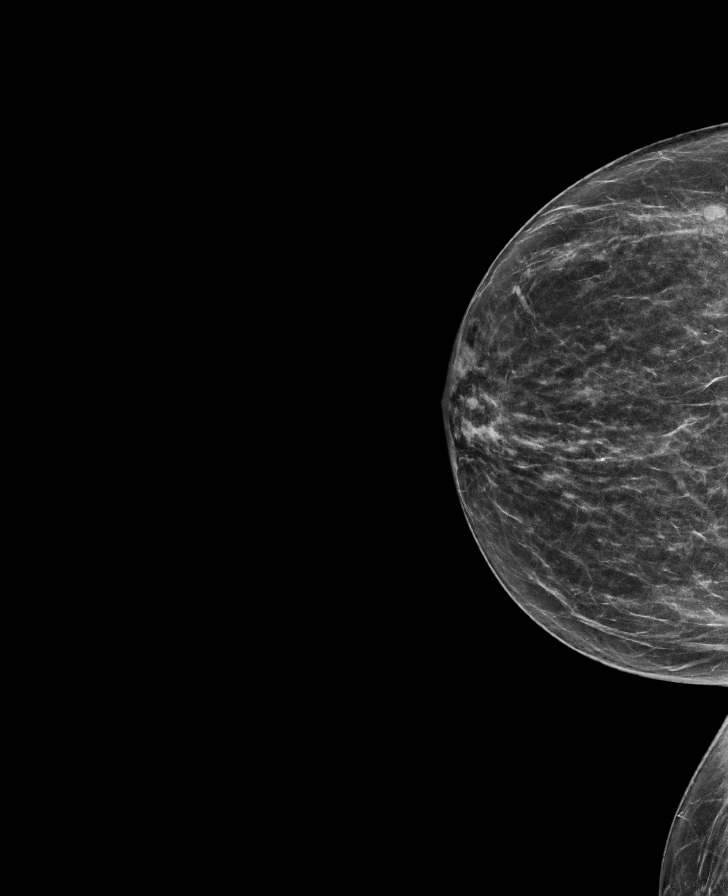

[R MLO synth-2D]
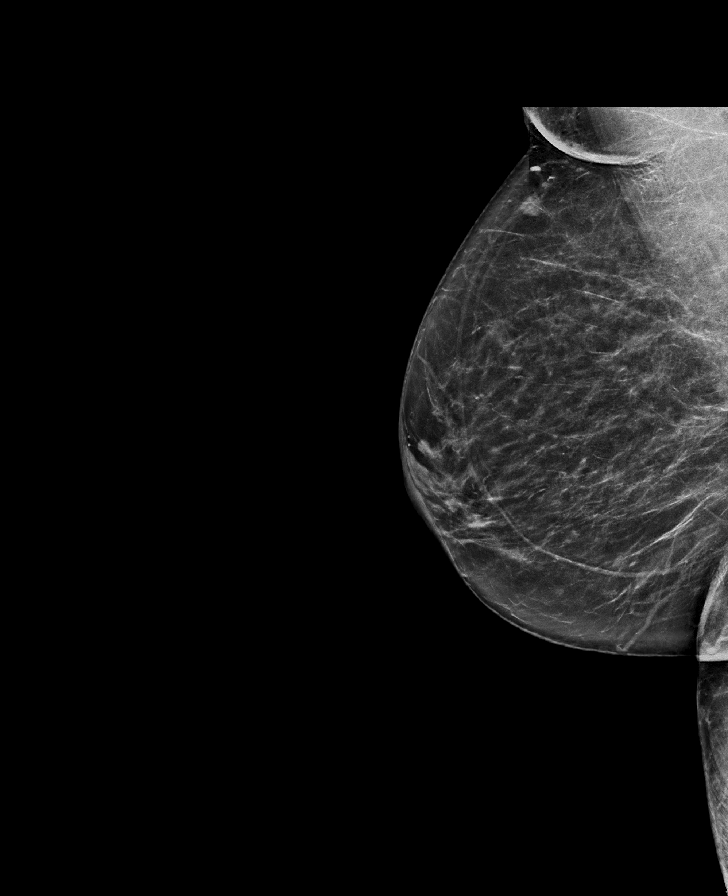

[L CC tomo · tomo slice 34/67.0]
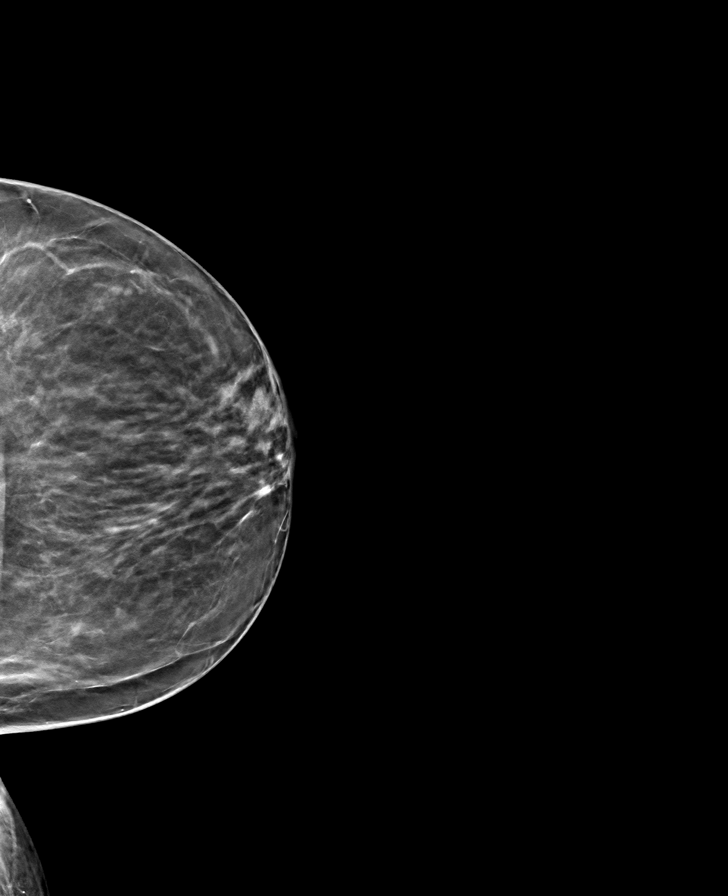

[L MLO tomo · tomo slice 35/70.0]
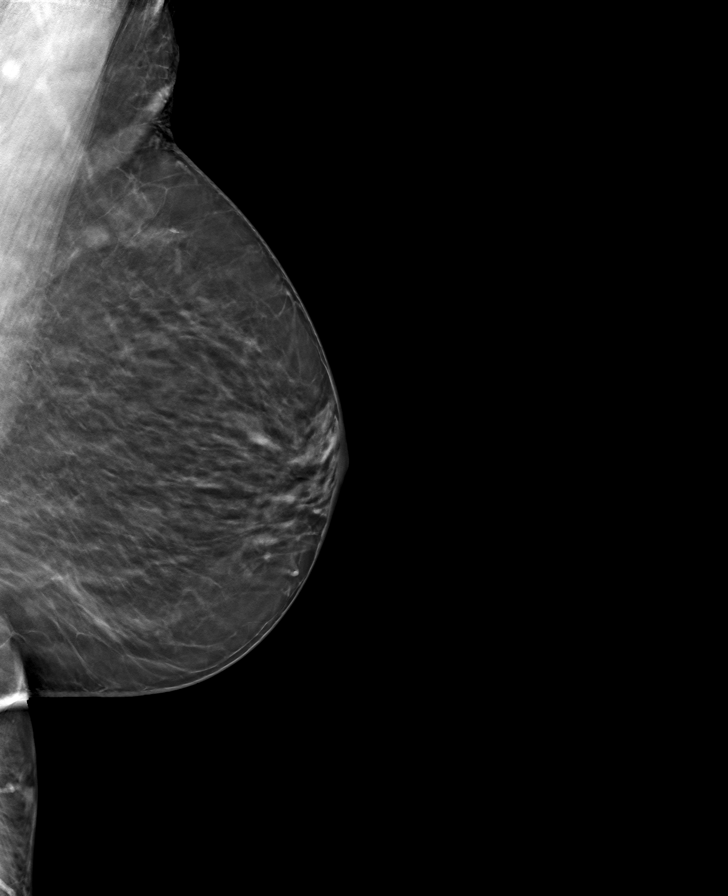

[R MLO tomo · tomo slice 36/71.0]
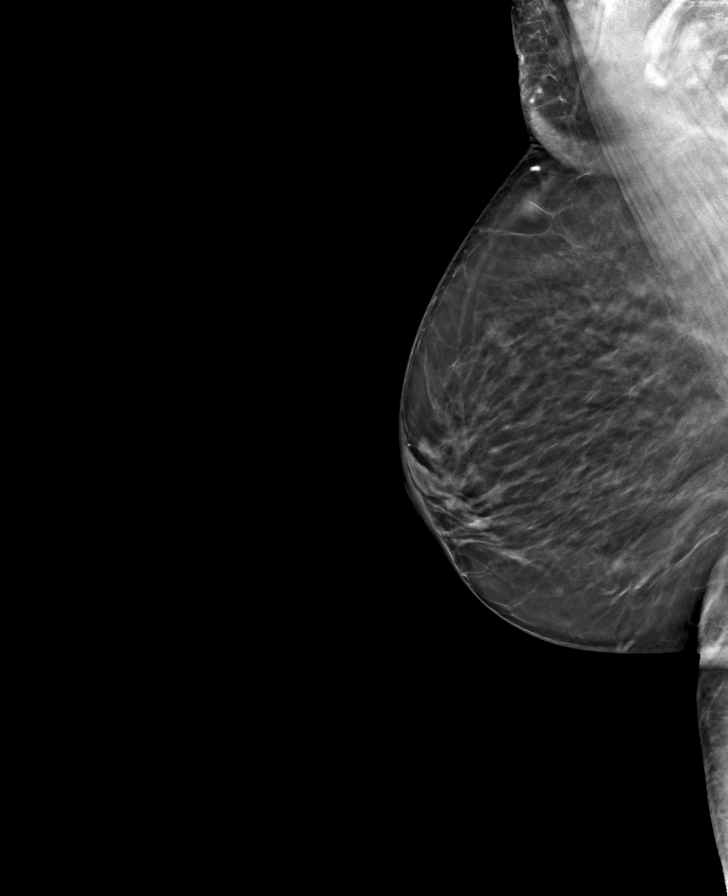

[R CC tomo · tomo slice 31/61.0]
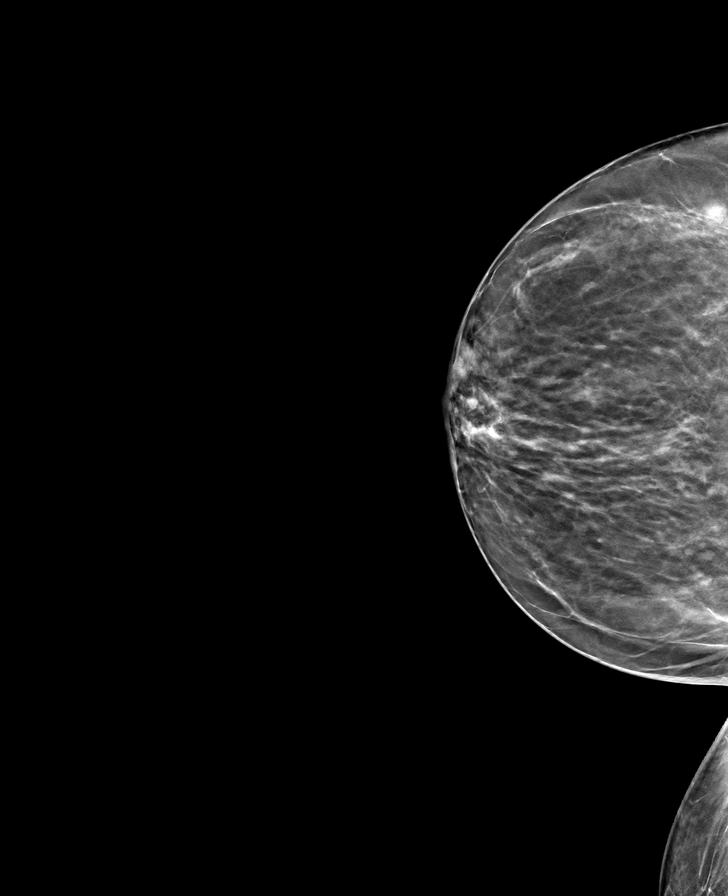

[8 of 24 positions shown; findings below may reference images not displayed]

ACR Breast Density Category b: There are scattered areas of
fibroglandular density.
FINDINGS: There are no findings suspicious for malignancy.
IMPRESSION: No mammographic evidence of malignancy. A result letter of this
screening mammogram will be mailed directly to the patient.

RECOMMENDATION:
Screening mammogram in one year. (Code:51-O-LD2)

BI-RADS CATEGORY  1: Negative.

## 2022-07-28 ENCOUNTER — Ambulatory Visit (INDEPENDENT_AMBULATORY_CARE_PROVIDER_SITE_OTHER): Payer: Medicare Other

## 2022-07-28 DIAGNOSIS — J4540 Moderate persistent asthma, uncomplicated: Secondary | ICD-10-CM | POA: Diagnosis not present

## 2022-07-30 ENCOUNTER — Ambulatory Visit: Payer: Medicare Other

## 2022-08-11 ENCOUNTER — Ambulatory Visit (INDEPENDENT_AMBULATORY_CARE_PROVIDER_SITE_OTHER): Payer: Medicare Other

## 2022-08-11 DIAGNOSIS — J4540 Moderate persistent asthma, uncomplicated: Secondary | ICD-10-CM

## 2022-08-14 ENCOUNTER — Ambulatory Visit
Admission: RE | Admit: 2022-08-14 | Discharge: 2022-08-14 | Disposition: A | Discharge status: Home / Self Care | Payer: Medicare Other | Source: Ambulatory Visit | Attending: Family Medicine | Admitting: Family Medicine

## 2022-08-14 DIAGNOSIS — Z1231 Encounter for screening mammogram for malignant neoplasm of breast: Secondary | ICD-10-CM | POA: Diagnosis not present

## 2022-08-25 ENCOUNTER — Ambulatory Visit (INDEPENDENT_AMBULATORY_CARE_PROVIDER_SITE_OTHER): Payer: Medicare Other

## 2022-08-25 DIAGNOSIS — J4540 Moderate persistent asthma, uncomplicated: Secondary | ICD-10-CM | POA: Diagnosis not present

## 2022-09-08 ENCOUNTER — Ambulatory Visit (INDEPENDENT_AMBULATORY_CARE_PROVIDER_SITE_OTHER): Payer: Medicare Other

## 2022-09-08 DIAGNOSIS — J4540 Moderate persistent asthma, uncomplicated: Secondary | ICD-10-CM | POA: Diagnosis not present

## 2022-09-17 DIAGNOSIS — G4733 Obstructive sleep apnea (adult) (pediatric): Secondary | ICD-10-CM | POA: Diagnosis not present

## 2022-09-23 ENCOUNTER — Ambulatory Visit (INDEPENDENT_AMBULATORY_CARE_PROVIDER_SITE_OTHER): Payer: Medicare Other

## 2022-09-23 DIAGNOSIS — J4540 Moderate persistent asthma, uncomplicated: Secondary | ICD-10-CM | POA: Diagnosis not present

## 2022-10-07 ENCOUNTER — Ambulatory Visit (INDEPENDENT_AMBULATORY_CARE_PROVIDER_SITE_OTHER): Payer: Medicare Other

## 2022-10-07 DIAGNOSIS — J4540 Moderate persistent asthma, uncomplicated: Secondary | ICD-10-CM | POA: Diagnosis not present

## 2022-10-21 ENCOUNTER — Ambulatory Visit (INDEPENDENT_AMBULATORY_CARE_PROVIDER_SITE_OTHER): Payer: Medicare Other | Admitting: *Deleted

## 2022-10-21 DIAGNOSIS — J4540 Moderate persistent asthma, uncomplicated: Secondary | ICD-10-CM

## 2022-11-02 ENCOUNTER — Ambulatory Visit (INDEPENDENT_AMBULATORY_CARE_PROVIDER_SITE_OTHER): Payer: Medicare Other | Admitting: *Deleted

## 2022-11-02 DIAGNOSIS — J4540 Moderate persistent asthma, uncomplicated: Secondary | ICD-10-CM

## 2022-11-17 DIAGNOSIS — L919 Hypertrophic disorder of the skin, unspecified: Secondary | ICD-10-CM | POA: Diagnosis not present

## 2022-11-17 DIAGNOSIS — L818 Other specified disorders of pigmentation: Secondary | ICD-10-CM | POA: Diagnosis not present

## 2022-11-17 DIAGNOSIS — L821 Other seborrheic keratosis: Secondary | ICD-10-CM | POA: Diagnosis not present

## 2022-11-19 ENCOUNTER — Ambulatory Visit (INDEPENDENT_AMBULATORY_CARE_PROVIDER_SITE_OTHER): Payer: Medicare Other

## 2022-11-19 DIAGNOSIS — J4550 Severe persistent asthma, uncomplicated: Secondary | ICD-10-CM | POA: Diagnosis not present

## 2022-11-20 DIAGNOSIS — H0015 Chalazion left lower eyelid: Secondary | ICD-10-CM | POA: Diagnosis not present

## 2022-11-29 NOTE — Progress Notes (Signed)
[facility] [address] [place], [place] [address] [phone]  Date:  12/03/2022   Name:  Ashley Villarreal   DOB:  [birth year]   MRN:  [id number]  PCP:  Pearline Cables, MD    Chief Complaint: No chief complaint on file.   History of Present Illness:  Ashley Villarreal is a 67 y.o. very pleasant female patient who presents with the following:  Patient seen today for periodic follow-up Most recent visit with myself was in December History of hypertension, asthma, sleep apnea, well controlled diabetes, hyperlipidemia, chronic kidney disease, vitamin D deficiency  She was previously seeing nephrology, but they released her due to stable renal function  Enjoys walking for exercise Colonoscopy due next year Tetanus can be updated Eye exam Foot exam needs update Labs done in December-CMP, lipid, vitamin D, CBC, A1c, TSH, urine microalbumin  Lab Results  Component Value Date   HGBA1C 7.5 (H) 06/03/2022   Amlodipine/valsartan Dupixent every other week Flovent inhaler Metformin 500 daily Pantoprazole Crestor 20 Patient Active Problem List   Diagnosis Date Noted   Otorrhagia, left 06/30/2021   Granuloma of tympanic membrane of left ear 06/30/2021   Osteopenia 06/05/2021   Myringotomy tube status 06/27/2018   CKD (chronic kidney disease) stage 3, GFR 30-59 ml/min (HCC) 04/13/2018   Chronic mucoid otitis media of left ear 03/11/2018   Chronic pansinusitis 01/07/2018   Nasal polyposis 01/07/2018   Bilateral sensorineural hearing loss 12/08/2017   Gastroesophageal reflux disease without esophagitis 06/09/2017   Anosmia 06/09/2017   Asthma, severe persistent, well-controlled 06/12/2015   LPRD (laryngopharyngeal reflux disease) 06/12/2015   Allergic rhinoconjunctivitis 06/12/2015   Allergy with anaphylaxis due to food 06/12/2015   OSA (obstructive sleep apnea)    Uterine leiomyoma 11/08/2011   Hyperlipemia  08/10/2011   Hypertensive heart disease    Diabetes mellitus type 2, noninsulin dependent (HCC)     Past Medical History:  Diagnosis Date   Allergy    Asthma    COPD (chronic obstructive pulmonary disease) (HCC)    Diabetes mellitus without complication (HCC)    Hypertension    Sleep apnea    uses CPAP    Past Surgical History:  Procedure Laterality Date   COLONOSCOPY     DILATION AND CURETTAGE OF UTERUS     NASAL SINUS SURGERY     TONSILLECTOMY  1975   TYMPANOSTOMY TUBE PLACEMENT      Social History   Tobacco Use   Smoking status: Never   Smokeless tobacco: Never  Vaping Use   Vaping Use: Never used  Substance Use Topics   Alcohol use: No    Alcohol/week: 0.0 standard drinks of alcohol   Drug use: No    Family History  Problem Relation Age of Onset   Stroke Father    Heart disease Father    Diabetes Mother    Heart disease Mother    Stroke Mother    Hyperlipidemia Sister    Hyperlipidemia Brother    Diabetes Paternal Grandmother    Hypertension Brother    Colon cancer Neg Hx    Allergic rhinitis Neg Hx    Angioedema Neg Hx    Asthma Neg Hx    Atopy Neg Hx    Eczema Neg Hx    Immunodeficiency Neg Hx    Urticaria Neg Hx     Allergies  Allergen Reactions   Doxycycline Nausea Only   Loratadine  Other (See Comments)    dizziness   Other Other (See Comments)    Magic mouthwash  Patient get blisters on her lips    Medication list has been reviewed and updated.  Current Outpatient Medications on File Prior to Visit  Medication Sig Dispense Refill   Accu-Chek Softclix Lancets lancets Check blood sugar once daily 100 each 12   amLODipine-valsartan (EXFORGE) 5-320 MG tablet Take 1 tablet by mouth daily.     blood glucose meter kit and supplies Accucheck test strips and meter. Use up to four times daily as directed. (FOR ICD-10 E10.9, E11.9). 1 each 0   Cholecalciferol (VITAMIN D3) 1.25 MG (50000 UT) CAPS Take 1 weekly for 12 weeks 12 capsule 0    COVID-19 mRNA vaccine 2023-2024 (COMIRNATY) syringe Inject into the muscle. 0.3 mL 0   dupilumab (DUPIXENT) 300 MG/2ML prefilled syringe INJECT 1 SYRINGE UNDER THE SKIN EVERY OTHER WEEK 4 mL 11   EPINEPHrine (EPIPEN 2-PAK) 0.3 mg/0.3 mL IJ SOAJ injection Inject 0.3 mg into the muscle as needed for anaphylaxis. 1 each 1   FLOVENT HFA 110 MCG/ACT inhaler Inhale 2 puffs into the lungs 2 (two) times daily. 1 each 5   glucose blood (ACCU-CHEK GUIDE) test strip USE TO CHECK BLOOD SUGAR ONCE DAILY 50 strip 12   Hydroquinone-Sunscreens (ALPHAQUIN HP) 4 % CREA APPLY TO DARK SPOTS DAILY.  3   levalbuterol (XOPENEX HFA) 45 MCG/ACT inhaler TAKE 2 PUFFS BY MOUTH EVERY 6 HOURS AS NEEDED FOR WHEEZE 15 each 1   metFORMIN (GLUCOPHAGE) 500 MG tablet Take 1 tablet (500 mg total) by mouth daily. 90 tablet 3   mometasone (NASONEX) 50 MCG/ACT nasal spray Place 1-2 sprays into the nose 2 (two) times daily. 17 g 5   NASAL SALINE NA Place 1 spray into the nose daily. Neilmed Nasal Saline Rinse to use 1-2 times daily.     pantoprazole (PROTONIX) 40 MG tablet Take 1 tablet (40 mg total) by mouth daily. 90 tablet 3   rosuvastatin (CRESTOR) 20 MG tablet Take 1 tablet (20 mg total) by mouth daily. 90 tablet 3   Current Facility-Administered Medications on File Prior to Visit  Medication Dose Route Frequency Provider Last Rate Last Admin   dupilumab (DUPIXENT) prefilled syringe 300 mg  300 mg Subcutaneous Q14 Days Kozlow, Alvira Philips, MD   300 mg at 11/19/22 1335    Review of Systems:  As per HPI- otherwise negative.   Physical Examination: There were no vitals filed for this visit. There were no vitals filed for this visit. There is no height or weight on file to calculate BMI. Ideal Body Weight:    GEN: no acute distress. HEENT: Atraumatic, Normocephalic.  Ears and Nose: No external deformity. CV: RRR, No M/G/R. No JVD. No thrill. No extra heart sounds. PULM: CTA B, no wheezes, crackles, rhonchi. No retractions. No  resp. distress. No accessory muscle use. ABD: S, NT, ND, +BS. No rebound. No HSM. EXTR: No c/c/e PSYCH: Normally interactive. Conversant.    Assessment and Plan: ***  Signed Abbe Amsterdam, MD

## 2022-11-29 NOTE — Patient Instructions (Incomplete)
It was great to see you again today, I will be in touch with your labs Tetanus booster today Let me know how you do with the rx anti-persperant  Assuming all is well please see me in 6 months

## 2022-12-01 ENCOUNTER — Ambulatory Visit (INDEPENDENT_AMBULATORY_CARE_PROVIDER_SITE_OTHER): Payer: Medicare Other

## 2022-12-01 DIAGNOSIS — J4540 Moderate persistent asthma, uncomplicated: Secondary | ICD-10-CM

## 2022-12-03 ENCOUNTER — Encounter: Payer: Self-pay | Admitting: Family Medicine

## 2022-12-03 ENCOUNTER — Ambulatory Visit (INDEPENDENT_AMBULATORY_CARE_PROVIDER_SITE_OTHER): Payer: Medicare Other | Admitting: Family Medicine

## 2022-12-03 VITALS — BP 122/64 | HR 72 | Temp 98.8°F | Resp 18 | Ht 69.0 in | Wt 151.6 lb

## 2022-12-03 DIAGNOSIS — R61 Generalized hyperhidrosis: Secondary | ICD-10-CM | POA: Diagnosis not present

## 2022-12-03 DIAGNOSIS — D649 Anemia, unspecified: Secondary | ICD-10-CM

## 2022-12-03 DIAGNOSIS — E785 Hyperlipidemia, unspecified: Secondary | ICD-10-CM | POA: Diagnosis not present

## 2022-12-03 DIAGNOSIS — E1121 Type 2 diabetes mellitus with diabetic nephropathy: Secondary | ICD-10-CM

## 2022-12-03 DIAGNOSIS — Z23 Encounter for immunization: Secondary | ICD-10-CM

## 2022-12-03 DIAGNOSIS — S91301A Unspecified open wound, right foot, initial encounter: Secondary | ICD-10-CM

## 2022-12-03 DIAGNOSIS — I10 Essential (primary) hypertension: Secondary | ICD-10-CM | POA: Diagnosis not present

## 2022-12-03 LAB — BASIC METABOLIC PANEL
BUN: 13 mg/dL (ref 6–23)
CO2: 31 mEq/L (ref 19–32)
Calcium: 9.7 mg/dL (ref 8.4–10.5)
Chloride: 102 mEq/L (ref 96–112)
Creatinine, Ser: 1.17 mg/dL (ref 0.40–1.20)
GFR: 48.91 mL/min — ABNORMAL LOW (ref >60.00)
Glucose, Bld: 101 mg/dL — ABNORMAL HIGH (ref 70–99)
Potassium: 4.3 mEq/L (ref 3.5–5.1)
Sodium: 140 mEq/L (ref 135–145)

## 2022-12-03 LAB — CBC
HCT: 36.8 % (ref 36.0–46.0)
Hemoglobin: 11.6 g/dL — ABNORMAL LOW (ref 12.0–15.0)
MCHC: 31.6 g/dL (ref 30.0–36.0)
MCV: 83.3 fl (ref 78.0–100.0)
Platelets: 248 10*3/uL (ref 150.0–400.0)
RBC: 4.42 Mil/uL (ref 3.87–5.11)
RDW: 15.6 % — ABNORMAL HIGH (ref 11.5–15.5)
WBC: 6.8 10*3/uL (ref 4.0–10.5)

## 2022-12-03 LAB — HEMOGLOBIN A1C: Hgb A1c MFr Bld: 6.6 % — ABNORMAL HIGH (ref 4.6–6.5)

## 2022-12-03 MED ORDER — DRYSOL 20 % EX SOLN
Freq: Every day | CUTANEOUS | 2 refills | Status: AC
Start: 2022-12-03 — End: ?

## 2022-12-07 ENCOUNTER — Other Ambulatory Visit: Payer: Self-pay | Admitting: Family Medicine

## 2022-12-07 DIAGNOSIS — E1121 Type 2 diabetes mellitus with diabetic nephropathy: Secondary | ICD-10-CM

## 2022-12-15 ENCOUNTER — Ambulatory Visit (INDEPENDENT_AMBULATORY_CARE_PROVIDER_SITE_OTHER): Payer: Medicare Other | Admitting: *Deleted

## 2022-12-15 DIAGNOSIS — J4540 Moderate persistent asthma, uncomplicated: Secondary | ICD-10-CM

## 2022-12-16 DIAGNOSIS — G4733 Obstructive sleep apnea (adult) (pediatric): Secondary | ICD-10-CM | POA: Diagnosis not present

## 2023-01-06 ENCOUNTER — Ambulatory Visit (INDEPENDENT_AMBULATORY_CARE_PROVIDER_SITE_OTHER): Payer: Medicare Other | Admitting: *Deleted

## 2023-01-06 DIAGNOSIS — J4540 Moderate persistent asthma, uncomplicated: Secondary | ICD-10-CM | POA: Diagnosis not present

## 2023-01-07 ENCOUNTER — Other Ambulatory Visit: Payer: Self-pay | Admitting: *Deleted

## 2023-01-07 MED ORDER — DUPIXENT 300 MG/2ML ~~LOC~~ SOSY: PREFILLED_SYRINGE | SUBCUTANEOUS | 3 refills | Status: AC

## 2023-01-20 ENCOUNTER — Ambulatory Visit: Payer: Medicare Other

## 2023-01-25 ENCOUNTER — Ambulatory Visit (INDEPENDENT_AMBULATORY_CARE_PROVIDER_SITE_OTHER): Payer: Medicare Other

## 2023-01-25 DIAGNOSIS — J4540 Moderate persistent asthma, uncomplicated: Secondary | ICD-10-CM

## 2023-01-30 NOTE — Progress Notes (Unsigned)
HPI female never smoker followed for OSA, complicated by HBP, asthma/Xolair (Dr. Lucie Leather) NPSG 02/11/15- AHI 12.5 per hour, CPAP to 9   --------------------------------------------------------------------------------   01/29/22- 67 year old female never smoker followed for OSA, complicated by HBP, asthma/ Dupixent (Dr. Lucie Leather), DM2, GERD, CKD3,  CPAP 5-15/[employer] Download- compliance -100%, AHI 4.5/ hr Body weight today- 158 lbs Covid vax-3 Phizer Download reviewed. Some mask leak with nasal pillows. Allergy control of rhinitis is doing well- Dr Kozlow/ Dupixent.  02/01/23-67 year old female never smoker followed for OSA, complicated by HBP, Asthma/ Dupixent (Dr. Lucie Leather), DM2, GERD, CKD3,  CPAP 5-15/[employer] Download- compliance - Body weight today-     ROS-see HPI   + = positive Constitutional:    weight loss, night sweats, fevers, chills, fatigue, lassitude. HEENT:    headaches, difficulty swallowing, tooth/dental problems, sore throat,       sneezing, itching, ear ache, + nasal congestion, post nasal drip, snoring CV:    chest pain, orthopnea, PND, swelling in lower extremities, anasarca,                                                      dizziness, palpitations Resp:   shortness of breath with exertion or at rest.                productive cough,   non-productive cough, coughing up of blood.              change in color of mucus.  wheezing.   Skin:    rash or lesions. GI:  No-   heartburn, indigestion, abdominal pain, nausea, vomiting,  GU: . MS:   joint pain, stiffness, decreased range of motion, back pain. Neuro-     nothing unusual Psych:  change in mood or affect.  depression or anxiety.   memory loss.  OBJ- Physical Exam General- Alert, Oriented, Affect-appropriate, Distress- none acute, normal weight/ trim fit appearing Skin- rash-none, lesions- none, excoriation- none Lymphadenopathy- none Head- atraumatic            Eyes- Gross vision intact, PERRLA, conjunctivae and  secretions clear            Ears- Hearing, canals-normal            Nose- + turbinate edema, no-Septal dev, mucus, polyps, erosion, perforation             Throat- Mallampati III-IV , mucosa clear , drainage- none, tonsils- atrophic Neck- flexible , trachea midline, no stridor , thyroid nl, carotid no bruit Chest - symmetrical excursion , unlabored           Heart/CV- RRR , no murmur , no gallop  , no rub, nl s1 s2                           - JVD- none , edema- none, stasis changes- none, varices- none           Lung- clear to P&A, wheeze- none, cough- none , dullness-none, rub- none           Chest wall-  Abd-  Br/ Gen/ Rectal- Not done, not indicated Extrem- cyanosis- none, clubbing, none, atrophy- none, strength- nl Neuro- grossly intact to observation

## 2023-02-01 ENCOUNTER — Encounter: Payer: Self-pay | Admitting: Internal Medicine

## 2023-02-01 ENCOUNTER — Ambulatory Visit: Payer: Medicare Other | Admitting: Internal Medicine

## 2023-02-01 VITALS — BP 134/66 | HR 84 | Ht 68.0 in | Wt 155.2 lb

## 2023-02-01 DIAGNOSIS — J339 Nasal polyp, unspecified: Secondary | ICD-10-CM

## 2023-02-01 DIAGNOSIS — G4733 Obstructive sleep apnea (adult) (pediatric): Secondary | ICD-10-CM

## 2023-02-01 NOTE — Assessment & Plan Note (Signed)
She reports big help from Dupixent

## 2023-02-01 NOTE — Patient Instructions (Signed)
Order- DME [employer]- please change auto range to 5-15  Please call if we can help

## 2023-02-01 NOTE — Assessment & Plan Note (Signed)
Benefits from CPAP Plan- try going back to auto 5-12 to see if we can get AHI < 5 without excessive leak.

## 2023-02-08 ENCOUNTER — Ambulatory Visit (INDEPENDENT_AMBULATORY_CARE_PROVIDER_SITE_OTHER): Payer: Medicare Other | Admitting: *Deleted

## 2023-02-08 DIAGNOSIS — J4540 Moderate persistent asthma, uncomplicated: Secondary | ICD-10-CM

## 2023-02-09 DIAGNOSIS — H43813 Vitreous degeneration, bilateral: Secondary | ICD-10-CM | POA: Diagnosis not present

## 2023-02-09 DIAGNOSIS — H524 Presbyopia: Secondary | ICD-10-CM | POA: Diagnosis not present

## 2023-02-09 DIAGNOSIS — H52223 Regular astigmatism, bilateral: Secondary | ICD-10-CM | POA: Diagnosis not present

## 2023-02-09 DIAGNOSIS — H2513 Age-related nuclear cataract, bilateral: Secondary | ICD-10-CM | POA: Diagnosis not present

## 2023-02-09 DIAGNOSIS — E119 Type 2 diabetes mellitus without complications: Secondary | ICD-10-CM | POA: Diagnosis not present

## 2023-02-09 LAB — HM DIABETES EYE EXAM

## 2023-02-23 ENCOUNTER — Ambulatory Visit (INDEPENDENT_AMBULATORY_CARE_PROVIDER_SITE_OTHER): Payer: Medicare Other | Admitting: *Deleted

## 2023-02-23 DIAGNOSIS — J4540 Moderate persistent asthma, uncomplicated: Secondary | ICD-10-CM | POA: Diagnosis not present

## 2023-03-08 ENCOUNTER — Ambulatory Visit (INDEPENDENT_AMBULATORY_CARE_PROVIDER_SITE_OTHER): Payer: Medicare Other | Admitting: *Deleted

## 2023-03-08 DIAGNOSIS — J4540 Moderate persistent asthma, uncomplicated: Secondary | ICD-10-CM | POA: Diagnosis not present

## 2023-03-22 ENCOUNTER — Ambulatory Visit (INDEPENDENT_AMBULATORY_CARE_PROVIDER_SITE_OTHER): Payer: Medicare Other

## 2023-03-22 DIAGNOSIS — J4540 Moderate persistent asthma, uncomplicated: Secondary | ICD-10-CM | POA: Diagnosis not present

## 2023-04-05 ENCOUNTER — Ambulatory Visit: Payer: Medicare Other

## 2023-04-05 DIAGNOSIS — J4540 Moderate persistent asthma, uncomplicated: Secondary | ICD-10-CM

## 2023-04-20 ENCOUNTER — Ambulatory Visit (INDEPENDENT_AMBULATORY_CARE_PROVIDER_SITE_OTHER): Payer: Medicare Other | Admitting: *Deleted

## 2023-04-20 DIAGNOSIS — J4540 Moderate persistent asthma, uncomplicated: Secondary | ICD-10-CM

## 2023-05-03 ENCOUNTER — Ambulatory Visit (INDEPENDENT_AMBULATORY_CARE_PROVIDER_SITE_OTHER): Payer: Medicare Other

## 2023-05-03 DIAGNOSIS — J4540 Moderate persistent asthma, uncomplicated: Secondary | ICD-10-CM

## 2023-05-17 ENCOUNTER — Ambulatory Visit (INDEPENDENT_AMBULATORY_CARE_PROVIDER_SITE_OTHER): Payer: Medicare Other | Admitting: *Deleted

## 2023-05-17 DIAGNOSIS — J4540 Moderate persistent asthma, uncomplicated: Secondary | ICD-10-CM | POA: Diagnosis not present

## 2023-05-18 ENCOUNTER — Ambulatory Visit (INDEPENDENT_AMBULATORY_CARE_PROVIDER_SITE_OTHER): Payer: Medicare Other

## 2023-05-18 VITALS — Ht 68.0 in | Wt 150.0 lb

## 2023-05-18 DIAGNOSIS — Z0000 Encounter for general adult medical examination without abnormal findings: Secondary | ICD-10-CM

## 2023-05-18 NOTE — Progress Notes (Signed)
Subjective:   Ashley Villarreal is a 67 y.o. female who presents for Medicare Annual (Subsequent) preventive examination.  Visit Complete: Virtual I connected with  Jaynee Eagles on 05/18/23 by a audio enabled telemedicine application and verified that I am speaking with the correct person using two identifiers.  Patient Location: Home  Provider Location: Home Office  I discussed the limitations of evaluation and management by telemedicine. The patient expressed understanding and agreed to proceed.  Vital Signs: Because this visit was a virtual/telehealth visit, some criteria may be missing or patient reported. Any vitals not documented were not able to be obtained and vitals that have been documented are patient reported.  Patient Medicare AWV questionnaire was completed by the patient on 05/17/23; I have confirmed that all information answered by patient is correct and no changes since this date.  Cardiac Risk Factors include: advanced age (>14men, >4 women);diabetes mellitus;hypertension     Objective:    Today's Vitals   05/18/23 1433  Weight: 150 lb (68 kg)  Height: 5\' 8"  (1.727 m)   Body mass index is 22.81 kg/m.     05/18/2023    2:41 PM 07/30/2016    8:16 AM 07/16/2016    4:03 PM 02/11/2015    9:12 PM  Advanced Directives  Does Patient Have a Medical Advance Directive? Yes No No No  Type of Estate agent of [place];Living will     Copy of Healthcare Power of Attorney in Chart? No - copy requested     Would patient like information on creating a medical advance directive?    No - patient declined information    Current Medications (verified) Outpatient Encounter Medications as of 05/18/2023  Medication Sig   Accu-Chek Softclix Lancets lancets Check blood sugar once daily   aluminum chloride (DRYSOL) 20 % external solution Apply topically at bedtime.   amLODipine-valsartan (EXFORGE) 5-320 MG tablet Take 1 tablet by mouth daily.   blood  glucose meter kit and supplies Accucheck test strips and meter. Use up to four times daily as directed. (FOR ICD-10 E10.9, E11.9).   Cholecalciferol (VITAMIN D3) 1.25 MG (50000 UT) CAPS Take 1 weekly for 12 weeks   COVID-19 mRNA vaccine 2023-2024 (COMIRNATY) syringe Inject into the muscle.   dupilumab (DUPIXENT) 300 MG/2ML prefilled syringe INJECT 1 SYRINGE UNDER THE SKIN EVERY OTHER WEEK   EPINEPHrine (EPIPEN 2-PAK) 0.3 mg/0.3 mL IJ SOAJ injection Inject 0.3 mg into the muscle as needed for anaphylaxis.   FLOVENT HFA 110 MCG/ACT inhaler Inhale 2 puffs into the lungs 2 (two) times daily.   glucose blood (ACCU-CHEK GUIDE) test strip USE TO CHECK BLOOD SUGAR ONCE DAILY   Hydroquinone-Sunscreens (ALPHAQUIN HP) 4 % CREA APPLY TO DARK SPOTS DAILY.   levalbuterol (XOPENEX HFA) 45 MCG/ACT inhaler TAKE 2 PUFFS BY MOUTH EVERY 6 HOURS AS NEEDED FOR WHEEZE   metFORMIN (GLUCOPHAGE) 500 MG tablet Take 1 tablet (500 mg total) by mouth daily.   mometasone (NASONEX) 50 MCG/ACT nasal spray Place 1-2 sprays into the nose 2 (two) times daily.   NASAL SALINE NA Place 1 spray into the nose daily. Neilmed Nasal Saline Rinse to use 1-2 times daily.   pantoprazole (PROTONIX) 40 MG tablet Take 1 tablet (40 mg total) by mouth daily.   rosuvastatin (CRESTOR) 20 MG tablet Take 1 tablet (20 mg total) by mouth daily.   Facility-Administered Encounter Medications as of 05/18/2023  Medication   dupilumab (DUPIXENT) prefilled syringe 300 mg    Allergies (  verified) Doxycycline, Loratadine, and Other   History: Past Medical History:  Diagnosis Date   Allergy    Asthma    COPD (chronic obstructive pulmonary disease) (HCC)    Diabetes mellitus without complication (HCC)    Hypertension    Sleep apnea    uses CPAP   Past Surgical History:  Procedure Laterality Date   COLONOSCOPY     DILATION AND CURETTAGE OF UTERUS     NASAL SINUS SURGERY     TONSILLECTOMY  1975   TYMPANOSTOMY TUBE PLACEMENT     Family History   Problem Relation Age of Onset   Stroke Father    Heart disease Father    Diabetes Mother    Heart disease Mother    Stroke Mother    Hyperlipidemia Sister    Hyperlipidemia Brother    Diabetes Paternal Grandmother    Hypertension Brother    Colon cancer Neg Hx    Allergic rhinitis Neg Hx    Angioedema Neg Hx    Asthma Neg Hx    Atopy Neg Hx    Eczema Neg Hx    Immunodeficiency Neg Hx    Urticaria Neg Hx    Social History   Socioeconomic History   Marital status: Married    Spouse name: Not on file   Number of children: Not on file   Years of education: Not on file   Highest education level: Not on file  Occupational History   Occupation: Agricultural engineer: [employer]  Tobacco Use   Smoking status: Never   Smokeless tobacco: Never  Vaping Use   Vaping status: Never Used  Substance and Sexual Activity   Alcohol use: No    Alcohol/week: 0.0 standard drinks of alcohol   Drug use: No   Sexual activity: Yes    Partners: Male    Birth control/protection: Condom  Other Topics Concern   Not on file  Social History Narrative   Married. Education: college.   Social Determinants of Health   Financial Resource Strain: Low Risk  (05/17/2023)   Overall Financial Resource Strain (CARDIA)    Difficulty of Paying Living Expenses: Not hard at all  Food Insecurity: No Food Insecurity (05/17/2023)   Hunger Vital Sign    Worried About Running Out of Food in the Last Year: Never true    Ran Out of Food in the Last Year: Never true  Transportation Needs: No Transportation Needs (05/17/2023)   PRAPARE - Administrator, Civil Service (Medical): No    Lack of Transportation (Non-Medical): No  Physical Activity: Sufficiently Active (05/17/2023)   Exercise Vital Sign    Days of Exercise per Week: 7 days    Minutes of Exercise per Session: 60 min  Stress: No Stress Concern Present (05/17/2023)   [employer] of Occupational Health - Occupational Stress  Questionnaire    Feeling of Stress : Not at all  Social Connections: Unknown (05/17/2023)   Social Connection and Isolation Panel [NHANES]    Frequency of Communication with Friends and Family: Three times a week    Frequency of Social Gatherings with Friends and Family: Twice a week    Attends Religious Services: Not on Marketing executive or Organizations: Yes    Attends Engineer, structural: More than 4 times per year    Marital Status: Married    Tobacco Counseling Counseling given: Not Answered   Clinical Intake:  Pre-visit preparation completed: Yes  Pain : No/denies pain     BMI - recorded: 22.81 Nutritional Status: BMI of 19-24  Normal Nutritional Risks: None Diabetes: Yes CBG done?: No Did pt. bring in CBG monitor from home?: No  How often do you need to have someone help you when you read instructions, pamphlets, or other written materials from your doctor or pharmacy?: 1 - Never  Interpreter Needed?: No  Information entered by :: Theresa Mulligan LPN   Activities of Daily Living    05/17/2023   12:22 PM  In your present state of health, do you have any difficulty performing the following activities:  Hearing? 0  Vision? 0  Difficulty concentrating or making decisions? 0  Walking or climbing stairs? 0  Dressing or bathing? 0  Doing errands, shopping? 0  Preparing Food and eating ? N  Using the Toilet? N  In the past six months, have you accidently leaked urine? N  Do you have problems with loss of bowel control? N  Managing your Medications? N  Managing your Finances? N  Housekeeping or managing your Housekeeping? N    Patient Care Team: Copland, Gwenlyn Found, MD as PCP - General (Family Medicine) Waymon Budge, MD as Consulting Physician (Pulmonary Disease)  Indicate any recent Medical Services you may have received from other than [facility] providers in the past year (date may be approximate).     Assessment:   This is a  routine wellness examination for Hila.  Hearing/Vision screen Hearing Screening - Comments:: Denies hearing difficulties   Vision Screening - Comments:: Wears rx glasses - up to date with routine eye exams with  [facility]   Goals Addressed               This Visit's Progress     [place]  exercise program (pt-stated)         Depression Screen    05/18/2023    2:47 PM 12/03/2022    9:05 AM 12/01/2021    9:35 AM 06/02/2021    9:13 AM 05/22/2019    9:58 AM 05/18/2018    9:09 AM 11/17/2017    8:57 AM  PHQ 2/9 Scores  PHQ - 2 Score 0 0 0 0 0 0 1    Fall Risk    05/18/2023    2:39 PM 05/17/2023   12:22 PM 12/03/2022    9:05 AM 12/01/2021    9:35 AM 06/02/2021    9:13 AM  Fall Risk   Falls in the past year? 0 0 0 0 0  Number falls in past yr: 0 0 0 0 0  Injury with Fall? 0 0 0 0 0  Risk for fall due to : No Fall Risks  No Fall Risks    Follow up Falls prevention discussed  Falls evaluation completed      MEDICARE RISK AT HOME: Medicare Risk at Home Any stairs in or around the home?: Yes If so, are there any without handrails?: No Home free of loose throw rugs in walkways, pet beds, electrical cords, etc?: Yes Adequate lighting in your home to reduce risk of falls?: Yes Life alert?: No Use of a cane, walker or w/c?: No Grab bars in the bathroom?: No Shower chair or bench in shower?: No Elevated toilet seat or a handicapped toilet?: No  TIMED UP AND GO:  Was the test performed?  No    Cognitive Function:        05/18/2023    2:42 PM  6CIT Screen  What Year? 0 points  What month? 0 points  What time? 0 points  Count back from 20 0 points  Months in reverse 0 points  Repeat phrase 0 points  Total Score 0 points    Immunizations Immunization History  Administered Date(s) Administered   Fluad Quad(high Dose 65+) 04/14/2022   Influenza,inj,Quad PF,6+ Mos 05/01/2015, 05/06/2016, 05/17/2017, 05/31/2018, 04/24/2019, 04/16/2020, 04/15/2021    PFIZER(Purple Top)SARS-COV-2 Vaccination 08/25/2019, 09/20/2019, 04/20/2020   PNEUMOCOCCAL CONJUGATE-20 11/27/2020   Pfizer Covid-19 Vaccine Bivalent Booster 27yrs & up 04/28/2021   Pfizer(Comirnaty)Fall Seasonal Vaccine 12 years and older 06/08/2022   Pneumococcal Polysaccharide-23 06/23/2007   Td 12/03/2022   Tdap 10/03/2012   Zoster Recombinant(Shingrix) 11/27/2019, 05/29/2020    TDAP status: Up to date  Flu Vaccine status: Due, Education has been provided regarding the importance of this vaccine. Advised may receive this vaccine at local pharmacy or Health Dept. Aware to provide a copy of the vaccination record if obtained from local pharmacy or Health Dept. Verbalized acceptance and understanding.  Pneumococcal vaccine status: Up to date  Covid-19 vaccine status: Declined, Education has been provided regarding the importance of this vaccine but patient still declined. Advised may receive this vaccine at local pharmacy or Health Dept.or vaccine clinic. Aware to provide a copy of the vaccination record if obtained from local pharmacy or Health Dept. Verbalized acceptance and understanding.  Qualifies for Shingles Vaccine? Yes   Zostavax completed Yes   Shingrix Completed?: Yes  Screening Tests Health Maintenance  Topic Date Due   INFLUENZA VACCINE  01/21/2023   COVID-19 Vaccine (6 - 2023-24 season) 02/21/2023   Diabetic kidney evaluation - Urine ACR  06/04/2023   Colonoscopy  08/07/2023   HEMOGLOBIN A1C  06/04/2023   Diabetic kidney evaluation - eGFR measurement  12/03/2023   FOOT EXAM  12/03/2023   OPHTHALMOLOGY EXAM  02/09/2024   Medicare Annual Wellness (AWV)  05/17/2024   MAMMOGRAM  08/14/2024   DTaP/Tdap/Td (3 - Td or Tdap) 12/02/2032   Pneumonia Vaccine 44+ Years old  Completed   DEXA SCAN  Completed   Hepatitis C Screening  Completed   Zoster Vaccines- Shingrix  Completed   HPV VACCINES  Aged Out    Health Maintenance  Health Maintenance Due  Topic Date Due    INFLUENZA VACCINE  01/21/2023   COVID-19 Vaccine (6 - 2023-24 season) 02/21/2023   Diabetic kidney evaluation - Urine ACR  06/04/2023   Colonoscopy  08/07/2023    Colorectal cancer screening: Type of screening: Colonoscopy. Completed 08/06/16. Repeat every 7 years  Mammogram status: Completed 08/14/22. Repeat every year  Bone Density status: Ordered 06/05/21. Pt provided with contact info and advised to call to schedule appt.   Additional Screening:  Hepatitis C Screening: does qualify; Completed 05/01/15  Vision Screening: Recommended annual ophthalmology exams for early detection of glaucoma and other disorders of the eye. Is the patient up to date with their annual eye exam?  Yes  Who is the provider or what is the name of the office in which the patient attends annual eye exams? [facility] If pt is not established with a provider, would they like to be referred to a provider to establish care? No .   Dental Screening: Recommended annual dental exams for proper oral hygiene  Diabetic Foot Exam: Diabetic Foot Exam: Completed 12/03/22  Community Resource Referral / Chronic Care Management:  CRR required this visit?  No   CCM required this visit?  No     Plan:  I have personally reviewed and noted the following in the patient's chart:   Medical and social history Use of alcohol, tobacco or illicit drugs  Current medications and supplements including opioid prescriptions. Patient is not currently taking opioid prescriptions. Functional ability and status Nutritional status Physical activity Advanced directives List of other physicians Hospitalizations, surgeries, and ER visits in previous 12 months Vitals Screenings to include cognitive, depression, and falls Referrals and appointments  In addition, I have reviewed and discussed with patient certain preventive protocols, quality metrics, and best practice recommendations. A written personalized care plan for  preventive services as well as general preventive health recommendations were provided to patient.     Tillie Rung, LPN   [id number]   After Visit Summary: (MyChart) Due to this being a telephonic visit, the after visit summary with patients personalized plan was offered to patient via MyChart   Nurse Notes: None

## 2023-05-18 NOTE — Patient Instructions (Addendum)
Ashley Villarreal , Thank you for taking time to come for your Medicare Wellness Visit. I appreciate your ongoing commitment to your health goals. Please review the following plan we discussed and let me know if I can assist you in the future.   Referrals/Orders/Follow-Ups/Clinician Recommendations:   This is a list of the screening recommended for you and due dates:  Health Maintenance  Topic Date Due   Flu Shot  01/21/2023   COVID-19 Vaccine (6 - 2023-24 season) 02/21/2023   Yearly kidney health urinalysis for diabetes  06/04/2023   Colon Cancer Screening  08/07/2023   Hemoglobin A1C  06/04/2023   Yearly kidney function blood test for diabetes  12/03/2023   Complete foot exam   12/03/2023   Eye exam for diabetics  02/09/2024   Medicare Annual Wellness Visit  05/17/2024   Mammogram  08/14/2024   DTaP/Tdap/Td vaccine (3 - Td or Tdap) 12/02/2032   Pneumonia Vaccine  Completed   DEXA scan (bone density measurement)  Completed   Hepatitis C Screening  Completed   Zoster (Shingles) Vaccine  Completed   HPV Vaccine  Aged Out    Advanced directives: (Copy Requested) Please bring a copy of your health care power of attorney and living will to the office to be added to your chart at your convenience.  Next Medicare Annual Wellness Visit scheduled for next year: Yes

## 2023-05-31 ENCOUNTER — Ambulatory Visit (INDEPENDENT_AMBULATORY_CARE_PROVIDER_SITE_OTHER): Payer: Medicare Other

## 2023-05-31 DIAGNOSIS — J4540 Moderate persistent asthma, uncomplicated: Secondary | ICD-10-CM | POA: Diagnosis not present

## 2023-06-01 ENCOUNTER — Other Ambulatory Visit: Payer: Self-pay

## 2023-06-01 ENCOUNTER — Ambulatory Visit: Payer: Medicare Other | Admitting: Allergy and Immunology

## 2023-06-01 VITALS — BP 130/76 | HR 82 | Temp 98.1°F | Resp 18 | Wt 159.4 lb

## 2023-06-01 DIAGNOSIS — J3089 Other allergic rhinitis: Secondary | ICD-10-CM

## 2023-06-01 DIAGNOSIS — J4550 Severe persistent asthma, uncomplicated: Secondary | ICD-10-CM | POA: Diagnosis not present

## 2023-06-01 DIAGNOSIS — J324 Chronic pansinusitis: Secondary | ICD-10-CM

## 2023-06-01 DIAGNOSIS — K219 Gastro-esophageal reflux disease without esophagitis: Secondary | ICD-10-CM | POA: Diagnosis not present

## 2023-06-01 DIAGNOSIS — J339 Nasal polyp, unspecified: Secondary | ICD-10-CM | POA: Diagnosis not present

## 2023-06-01 MED ORDER — PANTOPRAZOLE SODIUM 40 MG PO TBEC: 40.0000 mg | DELAYED_RELEASE_TABLET | Freq: Every day | ORAL | 3 refills | Status: DC

## 2023-06-01 NOTE — Progress Notes (Unsigned)
[place] [facility] [place] [facility] [place] - [place] - [place]   Follow-up Note  Referring Provider: Pearline Cables, MD Primary Provider: Pearline Cables, MD Date of Office Visit: 06/01/2023  Subjective:   Ashley Villarreal (DOB: September 29, 1955) is a 67 y.o. female who [facility] in re-evaluation of the following:  HPI: Ashley Villarreal returns to this clinic in evaluation of severe asthma, chronic sinusitis, nasal polyposis, reflux.  I last saw her in this clinic 09 June 2022.  She has really done well with her airway while using dupilumab on a consistent basis along with some over-the-counter Nasonex.  She has not required a systemic steroid or an antibiotic for any type of airway issue and she rarely uses a short acting bronchodilator and she can exercise without any problem.  Her reflux is under excellent control while using a proton pump inhibitor.  Allergies as of 06/01/2023       Reactions   Doxycycline Nausea Only   Loratadine Other (See Comments)   dizziness   Other Other (See Comments)   Magic mouthwash  Patient get blisters on her lips        Medication List    Accu-Chek Guide test strip Generic drug: glucose blood USE TO CHECK BLOOD SUGAR ONCE DAILY   Accu-Chek Softclix Lancets lancets Check blood sugar once daily   Alphaquin HP 4 % Crea APPLY TO DARK SPOTS DAILY.   amLODipine-valsartan 5-320 MG tablet Commonly known as: EXFORGE Take 1 tablet by mouth daily.   blood glucose meter kit and supplies Accucheck test strips and meter. Use up to four times daily as directed. (FOR ICD-10 E10.9, E11.9).   Comirnaty syringe Generic drug: COVID-19 mRNA vaccine (Pfizer) Inject into the muscle.   Drysol 20 % external solution Generic drug: aluminum chloride Apply topically at bedtime.   Dupixent 300 MG/2ML prefilled syringe Generic drug: dupilumab INJECT 1 SYRINGE UNDER THE SKIN EVERY OTHER WEEK   EPINEPHrine 0.3  mg/0.3 mL Soaj injection Commonly known as: EpiPen 2-Pak Inject 0.3 mg into the muscle as needed for anaphylaxis.   Flovent HFA 110 MCG/ACT inhaler Generic drug: fluticasone Inhale 2 puffs into the lungs 2 (two) times daily.   levalbuterol 45 MCG/ACT inhaler Commonly known as: XOPENEX HFA TAKE 2 PUFFS BY MOUTH EVERY 6 HOURS AS NEEDED FOR WHEEZE   metFORMIN 500 MG tablet Commonly known as: GLUCOPHAGE Take 1 tablet (500 mg total) by mouth daily.   mometasone 50 MCG/ACT nasal spray Commonly known as: NASONEX Place 1-2 sprays into the nose 2 (two) times daily.   NASAL SALINE NA Place 1 spray into the nose daily. Neilmed Nasal Saline Rinse to use 1-2 times daily.   pantoprazole 40 MG tablet Commonly known as: PROTONIX Take 1 tablet (40 mg total) by mouth daily.   rosuvastatin 20 MG tablet Commonly known as: CRESTOR Take 1 tablet (20 mg total) by mouth daily.   Vitamin D3 1.25 MG (50000 UT) Caps Take 1 weekly for 12 weeks    Past Medical History:  Diagnosis Date   Allergy    Asthma    COPD (chronic obstructive pulmonary disease) (HCC)    Diabetes mellitus without complication (HCC)    Hypertension    Sleep apnea    uses CPAP    Past Surgical History:  Procedure Laterality Date   COLONOSCOPY     DILATION AND CURETTAGE OF UTERUS     NASAL SINUS SURGERY     TONSILLECTOMY  1975   TYMPANOSTOMY TUBE PLACEMENT      Review of systems negative except as noted in HPI / PMHx or noted below:  Review of Systems  Constitutional: Negative.   HENT: Negative.    Eyes: Negative.   Respiratory: Negative.    Cardiovascular: Negative.   Gastrointestinal: Negative.   Genitourinary: Negative.   Musculoskeletal: Negative.   Skin: Negative.   Neurological: Negative.   Endo/Heme/Allergies: Negative.   Psychiatric/Behavioral: Negative.       Objective:   Vitals:   06/01/23 1504  BP: 130/76  Pulse: 82  Resp: 18  Temp: 98.1 F (36.7 C)  SpO2: 98%      Weight: 159 lb  6.4 oz (72.3 kg)   Physical Exam Constitutional:      Appearance: She is not diaphoretic.  HENT:     Head: Normocephalic.     Right Ear: Tympanic membrane, ear canal and external ear normal. A PE tube is present.     Left Ear: Tympanic membrane, ear canal and external ear normal.     Nose: Nose normal. No mucosal edema or rhinorrhea.     Mouth/Throat:     Pharynx: Uvula midline. No oropharyngeal exudate.  Eyes:     Conjunctiva/sclera: Conjunctivae normal.  Neck:     Thyroid: No thyromegaly.     Trachea: Trachea normal. No tracheal tenderness or tracheal deviation.  Cardiovascular:     Rate and Rhythm: Normal rate and regular rhythm.     Heart sounds: Normal heart sounds, S1 normal and S2 normal. No murmur heard. Pulmonary:     Effort: No respiratory distress.     Breath sounds: Normal breath sounds. No stridor. No wheezing or rales.  Lymphadenopathy:     Head:     Right side of head: No tonsillar adenopathy.     Left side of head: No tonsillar adenopathy.     Cervical: No cervical adenopathy.  Skin:    Findings: No erythema or rash.     Nails: There is no clubbing.  Neurological:     Mental Status: She is alert.     Diagnostics: Spirometry was performed and demonstrated an FEV1 of 1.76 at 77 % of predicted.  Assessment and Plan:   1. Asthma, severe persistent, well-controlled   2. Other allergic rhinitis   3. Chronic pansinusitis   4. Nasal polyposis   5. Gastroesophageal reflux disease without esophagitis    1.  Continue OTC Nasonex - 1-2 sprays each nostril 1-2 times per day     2.  Continue Dupilumab injections    3.  Continue pantoprazole 40 mg daily  4.  Continue Xopenex HFA if needed  5.  Obtain flu vaccine  6. Return to clinic in 12 months or earlier if problem  [name] is doing wonderful while using dupilumab regarding her multiorgan atopic inflammatory disease of her respiratory tract and she will continue on this biologic agent along with some Nasonex  and also continue to address the issue of her reflux with a proton pump inhibitor.  Assuming she does well with this plan I will see her back in this clinic in 1 year or earlier if there is a problem.  [name], MD Allergy / Immunology [facility]

## 2023-06-01 NOTE — Patient Instructions (Signed)
  1.  Continue OTC Nasonex - 1-2 sprays each nostril 1-2 times per day     2.  Continue Dupilumab injections    3.  Continue pantoprazole 40 mg daily  4.  Continue Xopenex HFA if needed  5.  Obtain flu vaccine  6. Return to clinic in 12 months or earlier if problem

## 2023-06-02 ENCOUNTER — Encounter: Payer: Self-pay | Admitting: Allergy and Immunology

## 2023-06-05 NOTE — Patient Instructions (Incomplete)
It was great to see you again today, I will be in touch with your labs as soon as possible Recommend a COVID booster if not done in the last 6 months or so.  Remember your colon cancer screening appears to be due next year-give them a call in January Your last colonoscopy was done by Dr. Gasper Lloyd at [facility] Address: [address], [place], [place] [address] Phone: [phone]  Assuming all is well please see me in about 6 months After your lab work, you can stop by imaging on the ground floor and do your bone density scan

## 2023-06-05 NOTE — Progress Notes (Unsigned)
[facility] [address], [address] [place], [place] [address] [phone]  Date:  06/07/2023   Name:  Ashley Villarreal   DOB:  1956-01-24   MRN:  [id number]  PCP:  Pearline Cables, MD    Chief Complaint: No chief complaint on file.   History of Present Illness:  Ashley Villarreal is a 67 y.o. very pleasant female patient who presents with the following:  Patient seen today for periodic follow-up Most recent visit with myself was in June of this year  History of hypertension, asthma, sleep apnea, well controlled diabetes, hyperlipidemia, chronic kidney disease, vitamin D deficiency. She was previously seeing nephrology, but they released her due to stable renal function  Her asthma and allergy specialist is Dr. Tonye Royalty recent visit last week: HPI: Ashley Villarreal returns to this clinic in evaluation of severe asthma, chronic sinusitis, nasal polyposis, reflux. She has really done well with her airway while using dupilumab on a consistent basis along with some over-the-counter Nasonex.  She has not required a systemic steroid or an antibiotic for any type of airway issue and she rarely uses a short acting bronchodilator and she can exercise without any problem. Her reflux is under excellent control while using a proton pump inhibitor.  She enjoys walking for exercise, does this regularly Never smoker, no alcohol  Flu vaccine COVID booster Due for urine micro Due for A1c Colon cancer screening-7-year colonoscopy due this coming February I got labs in June, BMP, CBC, A1c 6.6% Mammogram up-to-date Can update DEXA scan Pap last year, normal  Amlodipine valsartan Dupixent Flovent Metformin 500 daily Crestor 20 Pantoprazole 40  Patient Active Problem List   Diagnosis Date Noted   Otorrhagia, left 06/30/2021   Granuloma of tympanic membrane of left ear 06/30/2021   Osteopenia 06/05/2021   Myringotomy tube status 06/27/2018    CKD (chronic kidney disease) stage 3, GFR 30-59 ml/min (HCC) 04/13/2018   Chronic mucoid otitis media of left ear 03/11/2018   Chronic pansinusitis 01/07/2018   Nasal polyposis 01/07/2018   Bilateral sensorineural hearing loss 12/08/2017   Gastroesophageal reflux disease without esophagitis 06/09/2017   Anosmia 06/09/2017   Asthma, severe persistent, well-controlled 06/12/2015   LPRD (laryngopharyngeal reflux disease) 06/12/2015   Allergic rhinoconjunctivitis 06/12/2015   Allergy with anaphylaxis due to food 06/12/2015   OSA (obstructive sleep apnea)    Uterine leiomyoma 11/08/2011   Hyperlipemia 08/10/2011   Hypertensive heart disease    Diabetes mellitus type 2, noninsulin dependent (HCC)     Past Medical History:  Diagnosis Date   Allergy    Asthma    COPD (chronic obstructive pulmonary disease) (HCC)    Diabetes mellitus without complication (HCC)    Hypertension    Sleep apnea    uses CPAP    Past Surgical History:  Procedure Laterality Date   COLONOSCOPY     DILATION AND CURETTAGE OF UTERUS     NASAL SINUS SURGERY     TONSILLECTOMY  1975   TYMPANOSTOMY TUBE PLACEMENT      Social History   Tobacco Use   Smoking status: Never   Smokeless tobacco: Never  Vaping Use   Vaping status: Never Used  Substance Use Topics   Alcohol use: No    Alcohol/week: 0.0 standard drinks of alcohol   Drug use: No    Family History  Problem Relation Age of Onset   Stroke Father    Heart disease Father  Diabetes Mother    Heart disease Mother    Stroke Mother    Hyperlipidemia Sister    Hyperlipidemia Brother    Diabetes Paternal Grandmother    Hypertension Brother    Colon cancer Neg Hx    Allergic rhinitis Neg Hx    Angioedema Neg Hx    Asthma Neg Hx    Atopy Neg Hx    Eczema Neg Hx    Immunodeficiency Neg Hx    Urticaria Neg Hx     Allergies  Allergen Reactions   Doxycycline Nausea Only   Loratadine Other (See Comments)    dizziness   Other Other (See  Comments)    Magic mouthwash  Patient get blisters on her lips    Medication list has been reviewed and updated.  Current Outpatient Medications on File Prior to Visit  Medication Sig Dispense Refill   Accu-Chek Softclix Lancets lancets Check blood sugar once daily 100 each 12   aluminum chloride (DRYSOL) 20 % external solution Apply topically at bedtime. 60 mL 2   amLODipine-valsartan (EXFORGE) 5-320 MG tablet Take 1 tablet by mouth daily.     blood glucose meter kit and supplies Accucheck test strips and meter. Use up to four times daily as directed. (FOR ICD-10 E10.9, E11.9). 1 each 0   Cholecalciferol (VITAMIN D3) 1.25 MG (50000 UT) CAPS Take 1 weekly for 12 weeks 12 capsule 0   COVID-19 mRNA vaccine 2023-2024 (COMIRNATY) syringe Inject into the muscle. 0.3 mL 0   dupilumab (DUPIXENT) 300 MG/2ML prefilled syringe INJECT 1 SYRINGE UNDER THE SKIN EVERY OTHER WEEK 12 mL 3   EPINEPHrine (EPIPEN 2-PAK) 0.3 mg/0.3 mL IJ SOAJ injection Inject 0.3 mg into the muscle as needed for anaphylaxis. 1 each 1   FLOVENT HFA 110 MCG/ACT inhaler Inhale 2 puffs into the lungs 2 (two) times daily. 1 each 5   glucose blood (ACCU-CHEK GUIDE) test strip USE TO CHECK BLOOD SUGAR ONCE DAILY 50 strip 12   Hydroquinone-Sunscreens (ALPHAQUIN HP) 4 % CREA APPLY TO DARK SPOTS DAILY.  3   levalbuterol (XOPENEX HFA) 45 MCG/ACT inhaler TAKE 2 PUFFS BY MOUTH EVERY 6 HOURS AS NEEDED FOR WHEEZE 15 each 1   metFORMIN (GLUCOPHAGE) 500 MG tablet Take 1 tablet (500 mg total) by mouth daily. 90 tablet 1   mometasone (NASONEX) 50 MCG/ACT nasal spray Place 1-2 sprays into the nose 2 (two) times daily. 17 g 5   NASAL SALINE NA Place 1 spray into the nose daily. Neilmed Nasal Saline Rinse to use 1-2 times daily.     pantoprazole (PROTONIX) 40 MG tablet Take 1 tablet (40 mg total) by mouth daily. 90 tablet 3   rosuvastatin (CRESTOR) 20 MG tablet Take 1 tablet (20 mg total) by mouth daily. 90 tablet 3   Current Facility-Administered  Medications on File Prior to Visit  Medication Dose Route Frequency Provider Last Rate Last Admin   dupilumab (DUPIXENT) prefilled syringe 300 mg  300 mg Subcutaneous Q14 Days Kozlow, Alvira Philips, MD   300 mg at 05/31/23 1432    Review of Systems:  As per HPI- otherwise negative.   Physical Examination: There were no vitals filed for this visit. There were no vitals filed for this visit. There is no height or weight on file to calculate BMI. Ideal Body Weight:    GEN: no acute distress. HEENT: Atraumatic, Normocephalic.  Ears and Nose: No external deformity. CV: RRR, No M/G/R. No JVD. No thrill. No extra heart sounds. PULM:  CTA B, no wheezes, crackles, rhonchi. No retractions. No resp. distress. No accessory muscle use. ABD: S, NT, ND, +BS. No rebound. No HSM. EXTR: No c/c/e PSYCH: Normally interactive. Conversant.    Assessment and Plan: ***  Signed Abbe Amsterdam, MD

## 2023-06-07 ENCOUNTER — Encounter: Payer: Self-pay | Admitting: Family Medicine

## 2023-06-07 ENCOUNTER — Ambulatory Visit (HOSPITAL_BASED_OUTPATIENT_CLINIC_OR_DEPARTMENT_OTHER)
Admission: RE | Admit: 2023-06-07 | Discharge: 2023-06-07 | Disposition: A | Discharge status: Home / Self Care | Payer: Medicare Other | Source: Ambulatory Visit | Attending: Family Medicine | Admitting: Family Medicine

## 2023-06-07 ENCOUNTER — Ambulatory Visit (INDEPENDENT_AMBULATORY_CARE_PROVIDER_SITE_OTHER): Payer: Medicare Other | Admitting: Family Medicine

## 2023-06-07 VITALS — BP 124/80 | HR 80 | Temp 98.1°F | Resp 18 | Ht 69.0 in | Wt 155.2 lb

## 2023-06-07 DIAGNOSIS — Z7984 Long term (current) use of oral hypoglycemic drugs: Secondary | ICD-10-CM

## 2023-06-07 DIAGNOSIS — E2839 Other primary ovarian failure: Secondary | ICD-10-CM | POA: Insufficient documentation

## 2023-06-07 DIAGNOSIS — R5383 Other fatigue: Secondary | ICD-10-CM | POA: Diagnosis not present

## 2023-06-07 DIAGNOSIS — Z1329 Encounter for screening for other suspected endocrine disorder: Secondary | ICD-10-CM

## 2023-06-07 DIAGNOSIS — D649 Anemia, unspecified: Secondary | ICD-10-CM

## 2023-06-07 DIAGNOSIS — E559 Vitamin D deficiency, unspecified: Secondary | ICD-10-CM | POA: Diagnosis not present

## 2023-06-07 DIAGNOSIS — Z23 Encounter for immunization: Secondary | ICD-10-CM

## 2023-06-07 DIAGNOSIS — J4550 Severe persistent asthma, uncomplicated: Secondary | ICD-10-CM

## 2023-06-07 DIAGNOSIS — E785 Hyperlipidemia, unspecified: Secondary | ICD-10-CM

## 2023-06-07 DIAGNOSIS — E1121 Type 2 diabetes mellitus with diabetic nephropathy: Secondary | ICD-10-CM | POA: Diagnosis not present

## 2023-06-07 DIAGNOSIS — M85852 Other specified disorders of bone density and structure, left thigh: Secondary | ICD-10-CM | POA: Diagnosis not present

## 2023-06-07 DIAGNOSIS — Z780 Asymptomatic menopausal state: Secondary | ICD-10-CM | POA: Diagnosis not present

## 2023-06-07 DIAGNOSIS — R931 Abnormal findings on diagnostic imaging of heart and coronary circulation: Secondary | ICD-10-CM

## 2023-06-07 DIAGNOSIS — I10 Essential (primary) hypertension: Secondary | ICD-10-CM

## 2023-06-07 DIAGNOSIS — M85851 Other specified disorders of bone density and structure, right thigh: Secondary | ICD-10-CM | POA: Diagnosis not present

## 2023-06-07 LAB — COMPREHENSIVE METABOLIC PANEL
ALT: 15 U/L (ref 0–35)
AST: 19 U/L (ref 0–37)
Albumin: 4.3 g/dL (ref 3.5–5.2)
Alkaline Phosphatase: 71 U/L (ref 39–117)
BUN: 16 mg/dL (ref 6–23)
CO2: 30 meq/L (ref 19–32)
Calcium: 10 mg/dL (ref 8.4–10.5)
Chloride: 104 meq/L (ref 96–112)
Creatinine, Ser: 1.1 mg/dL (ref 0.40–1.20)
GFR: 52.48 mL/min — ABNORMAL LOW (ref >60.00)
Glucose, Bld: 100 mg/dL — ABNORMAL HIGH (ref 70–99)
Potassium: 4.1 meq/L (ref 3.5–5.1)
Sodium: 142 meq/L (ref 135–145)
Total Bilirubin: 0.4 mg/dL (ref 0.2–1.2)
Total Protein: 7.3 g/dL (ref 6.0–8.3)

## 2023-06-07 LAB — LIPID PANEL
Cholesterol: 167 mg/dL (ref 0–200)
HDL: 47.2 mg/dL (ref >39.00)
LDL Cholesterol: 103 mg/dL — ABNORMAL HIGH (ref 0–99)
NonHDL: 119.85
Total CHOL/HDL Ratio: 4
Triglycerides: 86 mg/dL (ref 0.0–149.0)
VLDL: 17.2 mg/dL (ref 0.0–40.0)

## 2023-06-07 LAB — TSH: TSH: 0.92 u[IU]/mL (ref 0.35–5.50)

## 2023-06-07 LAB — MICROALBUMIN / CREATININE URINE RATIO
Creatinine,U: 176.4 mg/dL
Microalb Creat Ratio: 56.5 mg/g — ABNORMAL HIGH (ref 0.0–30.0)
Microalb, Ur: 99.7 mg/dL — ABNORMAL HIGH (ref 0.0–1.9)

## 2023-06-07 LAB — VITAMIN D 25 HYDROXY (VIT D DEFICIENCY, FRACTURES): VITD: 41.19 ng/mL (ref 30.00–100.00)

## 2023-06-07 LAB — CBC
HCT: 37.4 % (ref 36.0–46.0)
Hemoglobin: 12 g/dL (ref 12.0–15.0)
MCHC: 32 g/dL (ref 30.0–36.0)
MCV: 83.3 fL (ref 78.0–100.0)
Platelets: 235 10*3/uL (ref 150.0–400.0)
RBC: 4.48 Mil/uL (ref 3.87–5.11)
RDW: 15.2 % (ref 11.5–15.5)
WBC: 6.7 10*3/uL (ref 4.0–10.5)

## 2023-06-07 LAB — HEMOGLOBIN A1C: Hgb A1c MFr Bld: 7.1 % — ABNORMAL HIGH (ref 4.6–6.5)

## 2023-06-07 MED ORDER — ROSUVASTATIN CALCIUM 20 MG PO TABS: 20.0000 mg | ORAL_TABLET | Freq: Every day | ORAL | 3 refills | Status: DC

## 2023-06-07 MED ORDER — METFORMIN HCL 500 MG PO TABS: 500.0000 mg | ORAL_TABLET | Freq: Every day | ORAL | 3 refills | Status: DC

## 2023-06-08 NOTE — Addendum Note (Signed)
Addended by: Abbe Amsterdam C on: 06/08/2023 01:02 PM   Modules accepted: Orders

## 2023-06-14 ENCOUNTER — Ambulatory Visit (INDEPENDENT_AMBULATORY_CARE_PROVIDER_SITE_OTHER): Payer: Medicare Other

## 2023-06-14 DIAGNOSIS — J4550 Severe persistent asthma, uncomplicated: Secondary | ICD-10-CM

## 2023-06-25 ENCOUNTER — Telehealth: Payer: Self-pay | Admitting: Allergy and Immunology

## 2023-06-25 ENCOUNTER — Other Ambulatory Visit: Payer: Self-pay | Admitting: *Deleted

## 2023-06-25 MED ORDER — LEVALBUTEROL TARTRATE 45 MCG/ACT IN AERO: INHALATION_SPRAY | RESPIRATORY_TRACT | 1 refills | Status: AC

## 2023-06-25 NOTE — Telephone Encounter (Signed)
 Ashley Villarreal came into the office and is requesting that Dr. Maurilio call in Xopenex .  Per Dr. Rowan recent note from visit 05/26/23, he stated she can continue Xopenex  if needed.  Zane states she wasn't using it much before so she never had him fill it, but now she wants some called in to [facility] on [address]

## 2023-06-25 NOTE — Telephone Encounter (Signed)
Refills have been sent in. Called and informed patient, patient verbalized understanding.  °

## 2023-06-30 ENCOUNTER — Ambulatory Visit: Payer: Medicare Other | Admitting: *Deleted

## 2023-06-30 DIAGNOSIS — J4540 Moderate persistent asthma, uncomplicated: Secondary | ICD-10-CM

## 2023-07-13 ENCOUNTER — Ambulatory Visit: Payer: Medicare Other | Admitting: *Deleted

## 2023-07-13 DIAGNOSIS — J4540 Moderate persistent asthma, uncomplicated: Secondary | ICD-10-CM

## 2023-07-20 ENCOUNTER — Other Ambulatory Visit: Payer: Self-pay | Admitting: Family Medicine

## 2023-07-20 DIAGNOSIS — Z1231 Encounter for screening mammogram for malignant neoplasm of breast: Secondary | ICD-10-CM

## 2023-07-28 ENCOUNTER — Ambulatory Visit: Payer: Medicare Other | Admitting: *Deleted

## 2023-07-28 DIAGNOSIS — J4540 Moderate persistent asthma, uncomplicated: Secondary | ICD-10-CM | POA: Diagnosis not present

## 2023-08-05 NOTE — Progress Notes (Signed)
CARDIOLOGY CONSULT NOTE       Patient ID: CRISTLE JARED MRN: [id number] DOB/AGE: [birth year] 68 y.o.  Referring Physician: Copland Primary Physician: Pearline Cables, MD Primary Cardiologist: New Reason for Consultation: CAD     HPI:  68 y.o. referred by Dr Patsy Lager for sub clinical CAD History of Asthma, COPD, DM, CKD, HTN, OSA/CPAP. Reviewed calcium score from 06/07/22 Total score 92 which was 85 th percentile for age/sex Despite this and DM she is not on ASA and crestor just started 06/07/23 LDL prior to starting was 846 .  A1c is elevated at 7.1.  No longer seeing nephrology as her Cr is stable at 1.1   She is a non smoker Enjoys walking She is from [place]. 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 Married 38 years no kids   Denies chest pain, dyspnea palpitations   ROS All other systems reviewed and negative except as noted above  Past Medical History:  Diagnosis Date   Allergic rhinoconjunctivitis    Allergy    Allergy with anaphylaxis due to food    Asthma    CKD (chronic kidney disease)    COPD (chronic obstructive pulmonary disease) (HCC)    Diabetes mellitus without complication (HCC)    DM (diabetes mellitus) (HCC)    Granuloma of tympanic membrane of left ear    Hypertension    Myringotomy tube status    Nasal polyposis    OSA (obstructive sleep apnea)    Osteopenia    Otorrhagia    Pansinusitis    Sleep apnea    uses CPAP   Uterine leiomyoma     Family History  Problem Relation Age of Onset   Stroke Father    Heart disease Father    Diabetes Mother    Heart disease Mother    Stroke Mother    Hyperlipidemia Sister    Hyperlipidemia Brother    Diabetes Paternal Grandmother    Hypertension Brother    Colon cancer Neg Hx    Allergic rhinitis Neg Hx    Angioedema Neg Hx    Asthma Neg Hx    Atopy Neg Hx    Eczema Neg Hx    Immunodeficiency Neg Hx    Urticaria Neg Hx     Social History   Socioeconomic History   Marital  status: Married    Spouse name: Not on file   Number of children: Not on file   Years of education: Not on file   Highest education level: Bachelor's degree (e.g., BA, AB, BS)  Occupational History   Occupation: Agricultural engineer: [employer]  Tobacco Use   Smoking status: Never   Smokeless tobacco: Never  Vaping Use   Vaping status: Never Used  Substance and Sexual Activity   Alcohol use: No    Alcohol/week: 0.0 standard drinks of alcohol   Drug use: No   Sexual activity: Yes    Partners: Male    Birth control/protection: Condom  Other Topics Concern   Not on file  Social History Narrative   Married. Education: college.   Social Drivers of Corporate investment banker Strain: Low Risk  (06/06/2023)   Overall Financial Resource Strain (CARDIA)    Difficulty of Paying Living Expenses: Not hard at all  Food Insecurity: No Food Insecurity (06/06/2023)   Hunger Vital Sign    Worried About Running Out of Food in the Last Year: Never true    Ran Out of Food in  the Last Year: Never true  Transportation Needs: No Transportation Needs (06/06/2023)   PRAPARE - Administrator, Civil Service (Medical): No    Lack of Transportation (Non-Medical): No  Physical Activity: Sufficiently Active (06/06/2023)   Exercise Vital Sign    Days of Exercise per Week: 7 days    Minutes of Exercise per Session: 90 min  Stress: No Stress Concern Present (06/06/2023)   [employer] of Occupational Health - Occupational Stress Questionnaire    Feeling of Stress : Not at all  Social Connections: Socially Integrated (06/06/2023)   Social Connection and Isolation Panel [NHANES]    Frequency of Communication with Friends and Family: More than three times a week    Frequency of Social Gatherings with Friends and Family: More than three times a week    Attends Religious Services: More than 4 times per year    Active Member of [employer] or Organizations: Yes    Attends Hospital doctor: More than 4 times per year    Marital Status: Married  Catering manager Violence: Not At Risk (05/18/2023)   Humiliation, Afraid, Rape, and Kick questionnaire    Fear of Current or Ex-Partner: No    Emotionally Abused: No    Physically Abused: No    Sexually Abused: No    Past Surgical History:  Procedure Laterality Date   COLONOSCOPY     DILATION AND CURETTAGE OF UTERUS     NASAL SINUS SURGERY     TONSILLECTOMY  1975   TYMPANOSTOMY TUBE PLACEMENT        Current Outpatient Medications:    Accu-Chek Softclix Lancets lancets, Check blood sugar once daily, Disp: 100 each, Rfl: 12   aluminum chloride (DRYSOL) 20 % external solution, Apply topically at bedtime., Disp: 60 mL, Rfl: 2   amLODipine-valsartan (EXFORGE) 5-320 MG tablet, Take 1 tablet by mouth daily., Disp: , Rfl:    blood glucose meter kit and supplies, Accucheck test strips and meter. Use up to four times daily as directed. (FOR ICD-10 E10.9, E11.9)., Disp: 1 each, Rfl: 0   Cholecalciferol (VITAMIN D3) 1.25 MG (50000 UT) CAPS, Take 1 weekly for 12 weeks, Disp: 12 capsule, Rfl: 0   dupilumab (DUPIXENT) 300 MG/2ML prefilled syringe, INJECT 1 SYRINGE UNDER THE SKIN EVERY OTHER WEEK, Disp: 12 mL, Rfl: 3   EPINEPHrine (EPIPEN 2-PAK) 0.3 mg/0.3 mL IJ SOAJ injection, Inject 0.3 mg into the muscle as needed for anaphylaxis., Disp: 1 each, Rfl: 1   FLOVENT HFA 110 MCG/ACT inhaler, Inhale 2 puffs into the lungs 2 (two) times daily., Disp: 1 each, Rfl: 5   glucose blood (ACCU-CHEK GUIDE) test strip, USE TO CHECK BLOOD SUGAR ONCE DAILY, Disp: 50 strip, Rfl: 12   Hydroquinone-Sunscreens (ALPHAQUIN HP) 4 % CREA, APPLY TO DARK SPOTS DAILY., Disp: , Rfl: 3   levalbuterol (XOPENEX HFA) 45 MCG/ACT inhaler, TAKE 2 PUFFS BY MOUTH EVERY 6 HOURS AS NEEDED FOR WHEEZE, Disp: 15 g, Rfl: 1   metFORMIN (GLUCOPHAGE) 500 MG tablet, Take 1 tablet (500 mg total) by mouth daily., Disp: 90 tablet, Rfl: 3   mometasone (NASONEX) 50 MCG/ACT nasal spray,  Place 1-2 sprays into the nose 2 (two) times daily., Disp: 17 g, Rfl: 5   NASAL SALINE NA, Place 1 spray into the nose daily. Neilmed Nasal Saline Rinse to use 1-2 times daily., Disp: , Rfl:    pantoprazole (PROTONIX) 40 MG tablet, Take 1 tablet (40 mg total) by mouth daily., Disp: 90 tablet,  Rfl: 3   rosuvastatin (CRESTOR) 20 MG tablet, Take 1 tablet (20 mg total) by mouth daily., Disp: 90 tablet, Rfl: 3  Current Facility-Administered Medications:    dupilumab (DUPIXENT) prefilled syringe 300 mg, 300 mg, Subcutaneous, Q14 Days, Kozlow, Alvira Philips, MD, 300 mg at 08/10/23 1506  dupilumab  300 mg Subcutaneous Q14 Days     Physical Exam: Blood pressure (!) 152/70, pulse 73, height 5\' 9"  (1.753 m), weight 153 lb 9.6 oz (69.7 kg), SpO2 99%.   Affect appropriate Healthy:  appears stated age HEENT: normal Neck supple with no adenopathy JVP normal no bruits no thyromegaly Lungs clear with no wheezing and good diaphragmatic motion Heart:  S1/S2 no murmur, no rub, gallop or click PMI normal Abdomen: benighn, BS positve, no tenderness, no AAA no bruit.  No HSM or HJR Distal pulses intact with no bruits No edema Neuro non-focal Skin warm and dry No muscular weakness   Labs:   Lab Results  Component Value Date   WBC 6.7 06/07/2023   HGB 12.0 06/07/2023   HCT 37.4 06/07/2023   MCV 83.3 06/07/2023   PLT 235.0 06/07/2023   No results for input(s): "NA", "K", "CL", "CO2", "BUN", "CREATININE", "CALCIUM", "PROT", "BILITOT", "ALKPHOS", "ALT", "AST", "GLUCOSE" in the last 168 hours.  Invalid input(s): "LABALBU" No results found for: "CKTOTAL", "CKMB", "CKMBINDEX", "TROPONINI"  Lab Results  Component Value Date   CHOL 167 06/07/2023   CHOL 173 06/03/2022   CHOL 206 (H) 06/02/2021   Lab Results  Component Value Date   HDL 47.20 06/07/2023   HDL 51.10 06/03/2022   HDL 52.60 06/02/2021   Lab Results  Component Value Date   LDLCALC 103 (H) 06/07/2023   LDLCALC 104 (H) 06/03/2022    LDLCALC 139 (H) 06/02/2021   Lab Results  Component Value Date   TRIG 86.0 06/07/2023   TRIG 88.0 06/03/2022   TRIG 73.0 06/02/2021   Lab Results  Component Value Date   CHOLHDL 4 06/07/2023   CHOLHDL 3 06/03/2022   CHOLHDL 4 06/02/2021   No results found for: "LDLDIRECT"    Radiology: No results found.  EKG: SR rate 70 normal    ASSESSMENT AND PLAN:   CAD: sub clinical with elevated calcium score for age. CRF;s include DM, HTN and HLD. ECG normal Shared decision making favor baseline ETT Start ASA 81 mg  HTN:  weight loss, DASH diet continue Exforge DM:  Needs less carbs ? Add Jardiance to glucophage per primary HLDL  needs repeat labs on crestor March per primary Asthma:  no active wheezing using Nasonex, xopenex and Dupixent GERD:  continue protonix OSA:  continue CPAP use   ETT  F/U in a year pending testing   Signed: Charlton Haws 08/13/2023, 3:08 PM

## 2023-08-10 ENCOUNTER — Ambulatory Visit: Payer: Medicare Other | Admitting: *Deleted

## 2023-08-10 DIAGNOSIS — J4540 Moderate persistent asthma, uncomplicated: Secondary | ICD-10-CM

## 2023-08-13 ENCOUNTER — Encounter: Payer: Self-pay | Admitting: Cardiovascular Disease

## 2023-08-13 ENCOUNTER — Ambulatory Visit: Payer: Medicare Other | Attending: Cardiovascular Disease | Admitting: Cardiovascular Disease

## 2023-08-13 VITALS — BP 152/70 | HR 73 | Ht 69.0 in | Wt 153.6 lb

## 2023-08-13 DIAGNOSIS — I2510 Atherosclerotic heart disease of native coronary artery without angina pectoris: Secondary | ICD-10-CM

## 2023-08-13 DIAGNOSIS — E782 Mixed hyperlipidemia: Secondary | ICD-10-CM

## 2023-08-13 DIAGNOSIS — I10 Essential (primary) hypertension: Secondary | ICD-10-CM | POA: Diagnosis not present

## 2023-08-13 DIAGNOSIS — R931 Abnormal findings on diagnostic imaging of heart and coronary circulation: Secondary | ICD-10-CM

## 2023-08-13 MED ORDER — ASPIRIN 81 MG PO TBEC: 81.0000 mg | DELAYED_RELEASE_TABLET | Freq: Every day | ORAL | Status: AC

## 2023-08-13 NOTE — Patient Instructions (Addendum)
Medication Instructions:  Your physician has recommended you make the following change in your medication:  1-START Aspirin 81 mg by mouth daily.  *If you need a refill on your cardiac medications before your next appointment, please call your pharmacy*  Lab Work: If you have labs (blood work) drawn today and your tests are completely normal, you will receive your results only by: MyChart Message (if you have MyChart) OR A paper copy in the mail If you have any lab test that is abnormal or we need to change your treatment, we will call you to review the results.   Testing/Procedures: Your physician has requested that you have an exercise tolerance test. For further information please visit [email or web address] Please also follow instruction sheet, as given.  Follow-Up: At [facility], you and your health needs are our priority.  As part of our continuing mission to provide you with exceptional heart care, we have created designated Provider Care Teams.  These Care Teams include your primary Cardiologist (physician) and Advanced Practice Providers (APPs -  Physician Assistants and Nurse Practitioners) who all work together to provide you with the care you need, when you need it.  We recommend signing up for the patient portal called "MyChart".  Sign up information is provided on this After Visit Summary.  MyChart is used to connect with patients for Virtual Visits (Telemedicine).  Patients are able to view lab/test results, encounter notes, upcoming appointments, etc.  Non-urgent messages can be sent to your provider as well.   To learn more about what you can do with MyChart, go to [email or web address].    Your next appointment:   1 year(s)  Provider:   Charlton Haws, MD     Other Instructions                       Patient Instructions for Stress Test  Medication instructions:   Do NOT take your _________N/A_______________________  Do not eat, drink or use tobacco  products four hours prior to the test.  Water is ok.  3.  Dress prepared to exercise in a comfortable, two piece clothing outfit and walking shoes.  4.  Bring any current prescription medications with you the day of the test.  5.  Notify the office 24 hours in advance if you cannot keep this appointment.  6.  If you have any questions, please call [phone].        1st Floor: - Lobby - Registration  - Pharmacy  - Lab - Cafe  2nd Floor: - PV Lab - Diagnostic Testing (echo, CT, nuclear med)  3rd Floor: - Vacant  4th Floor: - TCTS (cardiothoracic surgery) - AFib Clinic - Structural Heart Clinic - Vascular Surgery  - Vascular Ultrasound  5th Floor: - [facility] Cardiology (general and EP) - Clinical Pharmacy for coumadin, hypertension, lipid, weight-loss medications, and med management appointments    Valet parking services will be available as well.

## 2023-08-17 ENCOUNTER — Ambulatory Visit
Admission: RE | Admit: 2023-08-17 | Discharge: 2023-08-17 | Disposition: A | Discharge status: Home / Self Care | Payer: Medicare Other | Source: Ambulatory Visit

## 2023-08-17 DIAGNOSIS — Z1231 Encounter for screening mammogram for malignant neoplasm of breast: Secondary | ICD-10-CM | POA: Diagnosis not present

## 2023-08-19 ENCOUNTER — Ambulatory Visit: Payer: Medicare Other | Attending: Cardiovascular Disease

## 2023-08-19 DIAGNOSIS — I2510 Atherosclerotic heart disease of native coronary artery without angina pectoris: Secondary | ICD-10-CM

## 2023-08-19 DIAGNOSIS — R931 Abnormal findings on diagnostic imaging of heart and coronary circulation: Secondary | ICD-10-CM

## 2023-08-19 LAB — EXERCISE TOLERANCE TEST
Angina Index: 0
Duke Treadmill Score: 2
Estimated workload: 8.4
Exercise duration (min): 6 min
Exercise duration (sec): 57 s
MPHR: 154 {beats}/min
Peak HR: 139 {beats}/min
Percent HR: 90 %
RPE: 15
Rest HR: 74 {beats}/min
ST Depression (mm): 1 mm

## 2023-08-23 ENCOUNTER — Telehealth: Payer: Self-pay

## 2023-08-23 DIAGNOSIS — I2510 Atherosclerotic heart disease of native coronary artery without angina pectoris: Secondary | ICD-10-CM

## 2023-08-23 DIAGNOSIS — R9439 Abnormal result of other cardiovascular function study: Secondary | ICD-10-CM

## 2023-08-23 DIAGNOSIS — E782 Mixed hyperlipidemia: Secondary | ICD-10-CM

## 2023-08-23 DIAGNOSIS — I10 Essential (primary) hypertension: Secondary | ICD-10-CM

## 2023-08-23 DIAGNOSIS — R931 Abnormal findings on diagnostic imaging of heart and coronary circulation: Secondary | ICD-10-CM

## 2023-08-23 NOTE — Telephone Encounter (Signed)
-----   Message from Charlton Haws sent at 08/19/2023  4:53 PM EST ----- ETT abnormal but not high risk. Order f/u PET CT

## 2023-08-23 NOTE — Telephone Encounter (Signed)
 Called patient with Dr. Fabio Bering advisement. Will order cardiac PET.

## 2023-08-24 ENCOUNTER — Ambulatory Visit: Payer: Medicare Other

## 2023-08-24 DIAGNOSIS — J4540 Moderate persistent asthma, uncomplicated: Secondary | ICD-10-CM

## 2023-09-07 ENCOUNTER — Ambulatory Visit: Admitting: *Deleted

## 2023-09-07 DIAGNOSIS — J4540 Moderate persistent asthma, uncomplicated: Secondary | ICD-10-CM

## 2023-09-21 ENCOUNTER — Ambulatory Visit: Admitting: *Deleted

## 2023-09-21 DIAGNOSIS — J4540 Moderate persistent asthma, uncomplicated: Secondary | ICD-10-CM

## 2023-10-05 ENCOUNTER — Ambulatory Visit

## 2023-10-05 DIAGNOSIS — J4540 Moderate persistent asthma, uncomplicated: Secondary | ICD-10-CM | POA: Diagnosis not present

## 2023-10-11 ENCOUNTER — Encounter (HOSPITAL_COMMUNITY): Payer: Self-pay

## 2023-10-11 ENCOUNTER — Other Ambulatory Visit (HOSPITAL_COMMUNITY): Payer: Self-pay | Admitting: *Deleted

## 2023-10-11 DIAGNOSIS — R079 Chest pain, unspecified: Secondary | ICD-10-CM

## 2023-10-13 ENCOUNTER — Ambulatory Visit (HOSPITAL_COMMUNITY)
Admission: RE | Admit: 2023-10-13 | Discharge: 2023-10-13 | Disposition: A | Discharge status: Home / Self Care | Source: Ambulatory Visit | Attending: Cardiovascular Disease | Admitting: Cardiovascular Disease

## 2023-10-13 DIAGNOSIS — I10 Essential (primary) hypertension: Secondary | ICD-10-CM | POA: Diagnosis not present

## 2023-10-13 DIAGNOSIS — R9439 Abnormal result of other cardiovascular function study: Secondary | ICD-10-CM | POA: Diagnosis not present

## 2023-10-13 DIAGNOSIS — I2510 Atherosclerotic heart disease of native coronary artery without angina pectoris: Secondary | ICD-10-CM | POA: Diagnosis not present

## 2023-10-13 DIAGNOSIS — R931 Abnormal findings on diagnostic imaging of heart and coronary circulation: Secondary | ICD-10-CM

## 2023-10-13 DIAGNOSIS — E782 Mixed hyperlipidemia: Secondary | ICD-10-CM | POA: Diagnosis not present

## 2023-10-13 LAB — NM PET CT CARDIAC PERFUSION MULTI W/ABSOLUTE BLOODFLOW
LV dias vol: 99 mL (ref 46–106)
MBFR: 2.58
Nuc Rest EF: 59 %
Nuc Stress EF: 67 %
Peak HR: 100 {beats}/min
Rest HR: 69 {beats}/min
Rest MBF: 1.13 ml/g/min
Rest Nuclear Isotope Dose: 17.3 mCi
Rest perfusion cavity size (mL): 99 mL
ST Depression (mm): 0 mm
Stress MBF: 2.91 ml/g/min
Stress Nuclear Isotope Dose: 17.2 mCi
Stress perfusion cavity size (mL): 96 mL

## 2023-10-13 MED ORDER — RUBIDIUM RB82 GENERATOR (RUBYFILL): 17.3000 | PACK | Freq: Once | INTRAVENOUS | Status: DC

## 2023-10-13 MED ORDER — REGADENOSON 0.4 MG/5ML IV SOLN
0.4000 mg | Freq: Once | INTRAVENOUS | Status: AC
  Administered 2023-10-13: 0.4 mg via INTRAVENOUS

## 2023-10-13 MED ORDER — REGADENOSON 0.4 MG/5ML IV SOLN
INTRAVENOUS | Status: AC
  Filled 2023-10-13: qty 5

## 2023-10-20 ENCOUNTER — Ambulatory Visit (INDEPENDENT_AMBULATORY_CARE_PROVIDER_SITE_OTHER)

## 2023-10-20 DIAGNOSIS — J4540 Moderate persistent asthma, uncomplicated: Secondary | ICD-10-CM

## 2023-11-02 ENCOUNTER — Ambulatory Visit

## 2023-11-02 DIAGNOSIS — J4540 Moderate persistent asthma, uncomplicated: Secondary | ICD-10-CM | POA: Diagnosis not present

## 2023-11-16 ENCOUNTER — Telehealth: Payer: Self-pay | Admitting: Internal Medicine

## 2023-11-16 NOTE — Telephone Encounter (Signed)
 Cmn received from [facility] for CPAP supplies.

## 2023-11-17 ENCOUNTER — Ambulatory Visit (INDEPENDENT_AMBULATORY_CARE_PROVIDER_SITE_OTHER)

## 2023-11-17 DIAGNOSIS — L818 Other specified disorders of pigmentation: Secondary | ICD-10-CM | POA: Diagnosis not present

## 2023-11-17 DIAGNOSIS — J4540 Moderate persistent asthma, uncomplicated: Secondary | ICD-10-CM

## 2023-11-17 DIAGNOSIS — L821 Other seborrheic keratosis: Secondary | ICD-10-CM | POA: Diagnosis not present

## 2023-11-19 NOTE — Telephone Encounter (Signed)
 CMN faxed successfully and signed.

## 2023-12-01 ENCOUNTER — Ambulatory Visit

## 2023-12-01 DIAGNOSIS — J4540 Moderate persistent asthma, uncomplicated: Secondary | ICD-10-CM | POA: Diagnosis not present

## 2023-12-06 ENCOUNTER — Ambulatory Visit: Payer: Medicare Other | Admitting: Family Medicine

## 2023-12-06 NOTE — Progress Notes (Addendum)
 [facility] [address] [place], [place] [address] [phone]  Date:  12/13/2023   Name:  Ashley Villarreal   DOB:  April 18, 1956   MRN:  [id number]  PCP:  Watt Harlene BROCKS, MD    Chief Complaint: Follow-up (Pt would like a refill on test strips )   History of Present Illness:  Ashley Villarreal is a 68 y.o. very pleasant female patient who presents with the following:  Pt seen today for a 6 month recheck Last seen by myself in December  History of hypertension, asthma, sleep apnea, well controlled diabetes with microalbuminuria, hyperlipidemia, chronic kidney disease, vitamin D  deficiency. She was previously seeing nephrology, but they released her due to stable renal function. She sees Dr Kozlow for allergy/ asthma-undergoing immunotherapy  Seen by cardiology Dr Delford for elevated coronary calcium  -reviewed his note dated 2/25.  Of note she has been on Crestor  since 2022, prior she was on simvastatin .  If LDL is over 70 we can plan to increase crestor  Daily asa was added- we will monitor her renal function Walking for exercise   Colon cancer screening I believe is due- I will reach out to Dr Shila and get her scheduled  Foot exam can be update Update A1c Lab Results  Component Value Date   HGBA1C 7.1 (H) 06/07/2023   She is exercising- she enjoys walking and some jogging.  No chest pain or shortness of breath with exercise   Patient Active Problem List   Diagnosis Date Noted   Otorrhagia, left 06/30/2021   Granuloma of tympanic membrane of left ear 06/30/2021   Osteopenia 06/05/2021   Myringotomy tube status 06/27/2018   CKD (chronic kidney disease) stage 3, GFR 30-59 ml/min (HCC) 04/13/2018   Chronic mucoid otitis media of left ear 03/11/2018   Chronic pansinusitis 01/07/2018   Nasal polyposis 01/07/2018   Bilateral sensorineural hearing loss 12/08/2017   Gastroesophageal reflux disease without  esophagitis 06/09/2017   Anosmia 06/09/2017   Asthma, severe persistent, well-controlled 06/12/2015   LPRD (laryngopharyngeal reflux disease) 06/12/2015   Allergic rhinoconjunctivitis 06/12/2015   Allergy with anaphylaxis due to food 06/12/2015   OSA (obstructive sleep apnea)    Uterine leiomyoma 11/08/2011   Hyperlipemia 08/10/2011   Hypertensive heart disease    Diabetes mellitus type 2, noninsulin dependent (HCC)     Past Medical History:  Diagnosis Date   Allergic rhinoconjunctivitis    Allergy    Allergy with anaphylaxis due to food    Asthma    CKD (chronic kidney disease)    COPD (chronic obstructive pulmonary disease) (HCC)    Diabetes mellitus without complication (HCC)    DM (diabetes mellitus) (HCC)    Granuloma of tympanic membrane of left ear    Hypertension    Myringotomy tube status    Nasal polyposis    OSA (obstructive sleep apnea)    Osteopenia    Otorrhagia    Pansinusitis    Sleep apnea    uses CPAP   Uterine leiomyoma     Past Surgical History:  Procedure Laterality Date   COLONOSCOPY     DILATION AND CURETTAGE OF UTERUS     NASAL SINUS SURGERY     TONSILLECTOMY  1975   TYMPANOSTOMY TUBE PLACEMENT      Social History   Tobacco Use   Smoking status: Never   Smokeless tobacco: Never  Vaping Use   Vaping status: Never Used  Substance Use Topics   Alcohol use: No    Alcohol/week: 0.0 standard drinks of alcohol   Drug use: No    Family History  Problem Relation Age of Onset   Stroke Father    Heart disease Father    Diabetes Mother    Heart disease Mother    Stroke Mother    Hyperlipidemia Sister    Hyperlipidemia Brother    Diabetes Paternal Grandmother    Hypertension Brother    Colon cancer Neg Hx    Allergic rhinitis Neg Hx    Angioedema Neg Hx    Asthma Neg Hx    Atopy Neg Hx    Eczema Neg Hx    Immunodeficiency Neg Hx    Urticaria Neg Hx     Allergies  Allergen Reactions   Doxycycline  Nausea Only   Loratadine  Other (See Comments)    dizziness   Other Other (See Comments)    Magic mouthwash  Patient get blisters on her lips    Medication list has been reviewed and updated.  Current Outpatient Medications on File Prior to Visit  Medication Sig Dispense Refill   Accu-Chek Softclix Lancets lancets Check blood sugar once daily 100 each 12   aluminum chloride (DRYSOL) 20 % external solution Apply topically at bedtime. 60 mL 2   amLODipine-valsartan (EXFORGE) 5-320 MG tablet Take 1 tablet by mouth daily.     aspirin  EC 81 MG tablet Take 1 tablet (81 mg total) by mouth daily. Swallow whole.     blood glucose meter kit and supplies Accucheck test strips and meter. Use up to four times daily as directed. (FOR ICD-10 E10.9, E11.9). 1 each 0   Cholecalciferol (VITAMIN D3) 1.25 MG (50000 UT) CAPS Take 1 weekly for 12 weeks 12 capsule 0   dupilumab  (DUPIXENT ) 300 MG/2ML prefilled syringe INJECT 1 SYRINGE UNDER THE SKIN EVERY OTHER WEEK 12 mL 3   EPINEPHrine  (EPIPEN  2-PAK) 0.3 mg/0.3 mL IJ SOAJ injection Inject 0.3 mg into the muscle as needed for anaphylaxis. 1 each 1   FLOVENT  HFA 110 MCG/ACT inhaler Inhale 2 puffs into the lungs 2 (two) times daily. 1 each 5   glucose blood (ACCU-CHEK GUIDE) test strip USE TO CHECK BLOOD SUGAR ONCE DAILY 50 strip 12   Hydroquinone-Sunscreens (ALPHAQUIN HP) 4 % CREA APPLY TO DARK SPOTS DAILY.  3   levalbuterol  (XOPENEX  HFA) 45 MCG/ACT inhaler TAKE 2 PUFFS BY MOUTH EVERY 6 HOURS AS NEEDED FOR WHEEZE 15 g 1   metFORMIN  (GLUCOPHAGE ) 500 MG tablet Take 1 tablet (500 mg total) by mouth daily. 90 tablet 3   mometasone  (NASONEX ) 50 MCG/ACT nasal spray Place 1-2 sprays into the nose 2 (two) times daily. 17 g 5   NASAL SALINE NA Place 1 spray into the nose daily. Neilmed Nasal Saline Rinse to use 1-2 times daily.     pantoprazole  (PROTONIX ) 40 MG tablet Take 1 tablet (40 mg total) by mouth daily. 90 tablet 3   rosuvastatin  (CRESTOR ) 20 MG tablet Take 1 tablet (20 mg total) by mouth  daily. 90 tablet 3   Current Facility-Administered Medications on File Prior to Visit  Medication Dose Route Frequency Provider Last Rate Last Admin   dupilumab  (DUPIXENT ) prefilled syringe 300 mg  300 mg Subcutaneous Q14 Days Kozlow, Eric J, MD   300 mg at 12/01/23 1500    Review of Systems:  As per HPI- otherwise negative.   Physical Examination: Vitals:   12/13/23 0854  BP: 130/78  Pulse: 73  SpO2: 98%   Vitals:   12/13/23 0854  Weight: 151 lb 12.8 oz (68.9 kg)  Height: 5' 9 (1.753 m)   Body mass index is 22.42 kg/m. Ideal Body Weight: Weight in (lb) to have BMI = 25: 168.9  GEN: no acute distress.  Normal weight, looks well HEENT: Atraumatic, Normocephalic.  Bilateral TM wnl, oropharynx normal.  PEERL,EOMI.   Ears and Nose: No external deformity. CV: RRR, No M/G/R. No JVD. No thrill. No extra heart sounds. PULM: CTA B, no wheezes, crackles, rhonchi. No retractions. No resp. distress. No accessory muscle use. ABD: S, NT, ND. No rebound. No HSM. EXTR: No c/c/e PSYCH: Normally interactive. Conversant.  Foot exam: Completed today, normal  Assessment and Plan: Elevated coronary artery calcium  score - Plan: Lipid panel  Essential hypertension  Hyperlipidemia, unspecified hyperlipidemia type  Controlled type 2 diabetes mellitus with diabetic nephropathy, without long-term current use of insulin (HCC) - Plan: Hemoglobin A1c, glucose blood (ACCU-CHEK GUIDE TEST) test strip  Screening for colon cancer  Patient seen today for follow-up.  She has history of diabetes, hyperlipidemia, elevated coronary calcium  score.  She has been on statins for some time, cardiology requested a repeat lipid panel.  Will take care of this today  Follow-up on diabetes, she currently checks her blood sugar about once a week.  No low glucose noted On metformin   I reached out to her gastroenterologist and requested screening for patient  Signed Harlene Schroeder, MD  Received labs, message  to pt Heard back from her- she does not wish to increase her crestor  right now, she would like to see how things look in December  Results for orders placed or performed in visit on 12/13/23  Hemoglobin A1c   Collection Time: 12/13/23  9:30 AM  Result Value Ref Range   Hgb A1c MFr Bld 6.8 (H) 4.6 - 6.5 %  Lipid panel   Collection Time: 12/13/23  9:30 AM  Result Value Ref Range   Cholesterol 194 0 - 200 mg/dL   Triglycerides 05.9 0.0 - 149.0 mg/dL   HDL 49.09 >60.99 mg/dL   VLDL 81.1 0.0 - 59.9 mg/dL   LDL Cholesterol 875 (H) 0 - 99 mg/dL   Total CHOL/HDL Ratio 4    NonHDL 142.78

## 2023-12-13 ENCOUNTER — Encounter: Payer: Self-pay | Admitting: Family Medicine

## 2023-12-13 ENCOUNTER — Ambulatory Visit (INDEPENDENT_AMBULATORY_CARE_PROVIDER_SITE_OTHER): Admitting: Family Medicine

## 2023-12-13 VITALS — BP 130/78 | HR 73 | Ht 69.0 in | Wt 151.8 lb

## 2023-12-13 DIAGNOSIS — R931 Abnormal findings on diagnostic imaging of heart and coronary circulation: Secondary | ICD-10-CM

## 2023-12-13 DIAGNOSIS — E785 Hyperlipidemia, unspecified: Secondary | ICD-10-CM

## 2023-12-13 DIAGNOSIS — Z7984 Long term (current) use of oral hypoglycemic drugs: Secondary | ICD-10-CM

## 2023-12-13 DIAGNOSIS — Z1211 Encounter for screening for malignant neoplasm of colon: Secondary | ICD-10-CM

## 2023-12-13 DIAGNOSIS — I10 Essential (primary) hypertension: Secondary | ICD-10-CM

## 2023-12-13 DIAGNOSIS — E1121 Type 2 diabetes mellitus with diabetic nephropathy: Secondary | ICD-10-CM | POA: Diagnosis not present

## 2023-12-13 LAB — LIPID PANEL
Cholesterol: 194 mg/dL (ref 0–200)
HDL: 50.9 mg/dL (ref >39.00)
LDL Cholesterol: 124 mg/dL — ABNORMAL HIGH (ref 0–99)
NonHDL: 142.78
Total CHOL/HDL Ratio: 4
Triglycerides: 94 mg/dL (ref 0.0–149.0)
VLDL: 18.8 mg/dL (ref 0.0–40.0)

## 2023-12-13 LAB — HEMOGLOBIN A1C: Hgb A1c MFr Bld: 6.8 % — ABNORMAL HIGH (ref 4.6–6.5)

## 2023-12-13 MED ORDER — ACCU-CHEK GUIDE TEST VI STRP: ORAL_STRIP | 12 refills | Status: DC

## 2023-12-13 NOTE — Patient Instructions (Signed)
 Good to see you today!  Please see me in about 6 months assuming all is well  I will be in touch with your labs and will reach out to Dr Shila about your colon cancer screening

## 2023-12-14 ENCOUNTER — Ambulatory Visit

## 2023-12-14 DIAGNOSIS — J4540 Moderate persistent asthma, uncomplicated: Secondary | ICD-10-CM | POA: Diagnosis not present

## 2023-12-15 ENCOUNTER — Telehealth: Payer: Self-pay | Admitting: *Deleted

## 2023-12-15 NOTE — Telephone Encounter (Signed)
===  View-only below this line=== ----- Message ----- From: Nandigam, Kavitha V, MD Sent: 12/14/2023   4:42 PM EDT To: Grayce CHRISTELLA Loge, CMA; Harlene JAYSON Schroeder, MD  Will schedule it and reach out to the patient Thank you Wilkie ----- Message ----- From: Schroeder Harlene JAYSON, MD Sent: 12/13/2023   9:19 AM EDT To: Kavitha Nandigam V, MD  Hi Kavitha- I think Syona is due for screening this year if you would have your staff reach out to her? Thanks so much!  Jess   Left message for patient to contact the office for a direct recall colonoscopy. She can schedule with one of our patient care coordinators

## 2023-12-21 ENCOUNTER — Telehealth: Payer: Self-pay

## 2023-12-21 DIAGNOSIS — E1121 Type 2 diabetes mellitus with diabetic nephropathy: Secondary | ICD-10-CM

## 2023-12-21 NOTE — Telephone Encounter (Signed)
 Copied from CRM [id number]. Topic: Clinical - Prescription Issue >> Dec 21, 2023  4:30 PM Chiquita SQUIBB wrote: Reason for CRM: Patient called in stating the pharmacy is unable to fill the prescription for the glucose blood (ACCU-CHEK GUIDE) test strip because it is missing how to use it / dosage on the prescription.

## 2023-12-21 NOTE — Telephone Encounter (Signed)
 Copied from CRM [id number]. Topic: General - Other >> Dec 21, 2023  4:28 PM Ashley Villarreal wrote: Reason for CRM: Patient is requesting to speak to Dr. Watt patient would like to go over her office notes from her appointment on 06/23, as she has a few questions.

## 2023-12-22 MED ORDER — ACCU-CHEK GUIDE TEST VI STRP: ORAL_STRIP | 12 refills | Status: AC

## 2023-12-22 NOTE — Telephone Encounter (Signed)
 Called but did not reach- LMOM.  Please reach out to me on mychart if that would be ok with her

## 2023-12-22 NOTE — Addendum Note (Signed)
 Addended by: WATT RAISIN C on: 12/22/2023 03:57 PM   Modules accepted: Orders

## 2023-12-29 ENCOUNTER — Ambulatory Visit

## 2023-12-29 DIAGNOSIS — J4540 Moderate persistent asthma, uncomplicated: Secondary | ICD-10-CM | POA: Diagnosis not present

## 2024-01-13 ENCOUNTER — Ambulatory Visit

## 2024-01-13 DIAGNOSIS — J4540 Moderate persistent asthma, uncomplicated: Secondary | ICD-10-CM

## 2024-01-25 ENCOUNTER — Ambulatory Visit

## 2024-01-25 DIAGNOSIS — J4540 Moderate persistent asthma, uncomplicated: Secondary | ICD-10-CM

## 2024-02-01 ENCOUNTER — Ambulatory Visit: Admitting: Internal Medicine

## 2024-02-02 ENCOUNTER — Encounter: Payer: Self-pay | Admitting: Gastroenterology

## 2024-02-04 ENCOUNTER — Telehealth: Payer: Self-pay

## 2024-02-04 NOTE — Telephone Encounter (Signed)
 Received CRM fax from [facility]. Placed on Dr. Saundra desk in C Pod for his signature. Will fax back to [facility] at [phone] once signed.

## 2024-02-06 NOTE — Progress Notes (Unsigned)
 HPI female never smoker followed for OSA, complicated by HBP, asthma/Xolair  (Dr. Maurilio) NPSG 02/11/15- AHI 12.5 per hour, CPAP to 9   --------------------------------------------------------------------------------    02/01/23-68 year old female never smoker followed for OSA, complicated by HBP, Asthma/ Dupixent  (Dr. Maurilio), DM2, GERD, CKD3,  CPAP 5-12/[employer] Download- compliance -97%, AHI 5.9/ hr Body weight today- 155 lbs -----Pt is well. No issues with cpap machine Download reviewed. Pressure range was reduced, maybe for leak. Discussed mask fit. We are going to try going back up on PAP to see if we can get AHI back under 5, but not medically a problem if we need to stay at auto 5-12. Dupixent  has really helped her nasal polyps.  02/08/24- 68 year old female never smoker followed for OSA, complicated by HBP, Asthma/ Dupixent  (Dr. Maurilio), DM2, GERD, CKD3,  CPAP 5-12/[employer] Download- compliance -100%, AHI 6.9/hr Body weight today- I think wrong value entered. [facility] discussed. Discussed the use of AI scribe software for clinical note transcription with the patient, who gave verbal consent to proceed.  History of Present Illness   Ashley Villarreal is a 68 year old female with obstructive sleep apnea who presents for CPAP management issues.  She uses her CPAP machine nightly with a pressure range of five to twelve centimeters of water . The CPAP has improved her quality of life. However, she faces significant inconvenience due to unresolved issues with obtaining a new humidifier reservoir, stemming from a prescription mishap and confusion following an insurance transition from [employer] to [place].     Assessment and Plan:    Obstructive sleep apnea Managed with CPAP therapy. Current settings allow a pressure range of 5 to 12 cm H2O, with the machine operating near the upper limit. She experiences 6 to 7 breakthrough apneas per hour, which is medically acceptable, but the goal is to reduce  this to 5 apneas per hour. Transition from [employer] to [employer] due to insurance changes has caused confusion and issues with equipment supply, particularly with obtaining a humidifier reservoir. - Request [employer] to adjust CPAP pressure range to 5-15 cm H2O. - Ensure [employer] addresses equipment supply issues, specifically the humidifier reservoir. - Advise her to monitor for contact from [employer] and notify the office if no response within a week.     ROS-see HPI   + = positive Constitutional:    weight loss, night sweats, fevers, chills, fatigue, lassitude. HEENT:    headaches, difficulty swallowing, tooth/dental problems, sore throat,       sneezing, itching, ear ache, + nasal congestion, post nasal drip, snoring CV:    chest pain, orthopnea, PND, swelling in lower extremities, anasarca,                                                      dizziness, palpitations Resp:   shortness of breath with exertion or at rest.                productive cough,   non-productive cough, coughing up of blood.              change in color of mucus.  wheezing.   Skin:    rash or lesions. GI:  No-   heartburn, indigestion, abdominal pain, nausea, vomiting,  GU: . MS:   joint pain, stiffness, decreased range of motion, back pain. Neuro-  nothing unusual Psych:  change in mood or affect.  depression or anxiety.   memory loss.  OBJ- Physical Exam General- Alert, Oriented, Affect-appropriate, Distress- none acute, normal weight/ trim fit appearing Skin- rash-none, lesions- none, excoriation- none Lymphadenopathy- none Head- atraumatic            Eyes- Gross vision intact, PERRLA, conjunctivae and secretions clear            Ears- Hearing, canals-normal            Nose- + turbinate edema, no-Septal dev, mucus, polyps, erosion, perforation             Throat- Mallampati III-IV , mucosa clear , drainage- none, tonsils- atrophic Neck- flexible , trachea midline, no stridor , thyroid  nl, carotid no bruit Chest -  symmetrical excursion , unlabored           Heart/CV- RRR , no murmur , no gallop  , no rub, nl s1 s2                           - JVD- none , edema- none, stasis changes- none, varices- none           Lung- clear to P&A, wheeze- none, cough- none , dullness-none, rub- none           Chest wall-  Abd-  Br/ Gen/ Rectal- Not done, not indicated Extrem- cyanosis- none, clubbing, none, atrophy- none, strength- nl Neuro- grossly intact to observation

## 2024-02-08 ENCOUNTER — Ambulatory Visit (INDEPENDENT_AMBULATORY_CARE_PROVIDER_SITE_OTHER): Admitting: Internal Medicine

## 2024-02-08 ENCOUNTER — Encounter: Payer: Self-pay | Admitting: Internal Medicine

## 2024-02-08 ENCOUNTER — Ambulatory Visit (INDEPENDENT_AMBULATORY_CARE_PROVIDER_SITE_OTHER)

## 2024-02-08 VITALS — BP 155/73 | HR 70 | Temp 97.5°F | Ht 68.0 in

## 2024-02-08 DIAGNOSIS — G4733 Obstructive sleep apnea (adult) (pediatric): Secondary | ICD-10-CM | POA: Diagnosis not present

## 2024-02-08 DIAGNOSIS — J4540 Moderate persistent asthma, uncomplicated: Secondary | ICD-10-CM | POA: Diagnosis not present

## 2024-02-08 NOTE — Patient Instructions (Signed)
 Order- DME [employer]- please change CPAP auto range to 5-15, continue supplies, [employer]  Please call if we can help

## 2024-02-21 ENCOUNTER — Other Ambulatory Visit: Payer: Self-pay | Admitting: Family Medicine

## 2024-02-22 ENCOUNTER — Other Ambulatory Visit: Payer: Self-pay | Admitting: Family Medicine

## 2024-02-22 DIAGNOSIS — E119 Type 2 diabetes mellitus without complications: Secondary | ICD-10-CM | POA: Diagnosis not present

## 2024-02-22 DIAGNOSIS — H5212 Myopia, left eye: Secondary | ICD-10-CM | POA: Diagnosis not present

## 2024-02-22 DIAGNOSIS — H2513 Age-related nuclear cataract, bilateral: Secondary | ICD-10-CM | POA: Diagnosis not present

## 2024-02-22 DIAGNOSIS — H524 Presbyopia: Secondary | ICD-10-CM | POA: Diagnosis not present

## 2024-02-22 DIAGNOSIS — H43813 Vitreous degeneration, bilateral: Secondary | ICD-10-CM | POA: Diagnosis not present

## 2024-02-22 DIAGNOSIS — H52223 Regular astigmatism, bilateral: Secondary | ICD-10-CM | POA: Diagnosis not present

## 2024-02-22 LAB — HM DIABETES EYE EXAM

## 2024-02-22 NOTE — Telephone Encounter (Signed)
Rx already sent earlier today

## 2024-02-22 NOTE — Telephone Encounter (Signed)
 Copied from CRM [id number]. Topic: Clinical - Medication Refill >> Feb 22, 2024  2:19 PM Hamdi H wrote: Medication: amLODipine-valsartan (EXFORGE) 5-320 MG tablet  Has the patient contacted their pharmacy? Yes (Agent: If no, request that the patient contact the pharmacy for the refill. If patient does not wish to contact the pharmacy document the reason why and proceed with request.) (Agent: If yes, when and what did the pharmacy advise?) They haven't heard back about the refill request so they asked her to call us .   This is the patient's preferred pharmacy:  [id number] - [place], [place] - [address] AT [facility] [address] [place] [address] Phone: [phone] Fax: [phone]  Is this the correct pharmacy for this prescription? Yes If no, delete pharmacy and type the correct one.   Has the prescription been filled recently? No  Is the patient out of the medication? Yes  Has the patient been seen for an appointment in the last year OR does the patient have an upcoming appointment? Yes  Can we respond through MyChart? Yes  Agent: Please be advised that Rx refills may take up to 3 business days. We ask that you follow-up with your pharmacy.

## 2024-02-23 ENCOUNTER — Encounter: Payer: Self-pay | Admitting: Internal Medicine

## 2024-02-23 ENCOUNTER — Ambulatory Visit

## 2024-02-23 DIAGNOSIS — J4540 Moderate persistent asthma, uncomplicated: Secondary | ICD-10-CM | POA: Diagnosis not present

## 2024-03-08 ENCOUNTER — Ambulatory Visit

## 2024-03-08 DIAGNOSIS — J4540 Moderate persistent asthma, uncomplicated: Secondary | ICD-10-CM

## 2024-03-22 ENCOUNTER — Ambulatory Visit

## 2024-03-22 DIAGNOSIS — J4540 Moderate persistent asthma, uncomplicated: Secondary | ICD-10-CM

## 2024-03-27 ENCOUNTER — Ambulatory Visit

## 2024-03-27 ENCOUNTER — Encounter: Payer: Self-pay | Admitting: Gastroenterology

## 2024-03-27 VITALS — Ht 68.0 in | Wt 150.0 lb

## 2024-03-27 DIAGNOSIS — Z860100 Personal history of colon polyps, unspecified: Secondary | ICD-10-CM

## 2024-03-27 MED ORDER — NA SULFATE-K SULFATE-MG SULF 17.5-3.13-1.6 GM/177ML PO SOLN: 1.0000 | Freq: Once | ORAL | 0 refills | Status: AC

## 2024-03-27 NOTE — Progress Notes (Signed)
 No egg or soy allergy known to patient  No issues known to pt with past sedation with any surgeries or procedures Patient denies ever being told they had issues or difficulty with intubation  No FH of Malignant Hyperthermia Pt is not on diet pills Pt is not on  home 02  OSA using CPAP Pt is not on blood thinners  Pt denies issues with constipation  No A fib or A flutter.  Have any cardiac testing pending-- no  LOA: independent  Prep: suprep    PV completed with patient. Prep instructions sent via mychart and home address.

## 2024-04-05 ENCOUNTER — Ambulatory Visit (INDEPENDENT_AMBULATORY_CARE_PROVIDER_SITE_OTHER)

## 2024-04-05 DIAGNOSIS — J4540 Moderate persistent asthma, uncomplicated: Secondary | ICD-10-CM | POA: Diagnosis not present

## 2024-04-17 ENCOUNTER — Encounter: Payer: Self-pay | Admitting: Gastroenterology

## 2024-04-17 ENCOUNTER — Ambulatory Visit: Admitting: Gastroenterology

## 2024-04-17 VITALS — BP 124/65 | HR 68 | Temp 97.0°F | Resp 10 | Ht 68.0 in | Wt 150.0 lb

## 2024-04-17 DIAGNOSIS — K644 Residual hemorrhoidal skin tags: Secondary | ICD-10-CM

## 2024-04-17 DIAGNOSIS — Z1211 Encounter for screening for malignant neoplasm of colon: Secondary | ICD-10-CM

## 2024-04-17 DIAGNOSIS — Z860101 Personal history of adenomatous and serrated colon polyps: Secondary | ICD-10-CM

## 2024-04-17 DIAGNOSIS — K5730 Diverticulosis of large intestine without perforation or abscess without bleeding: Secondary | ICD-10-CM

## 2024-04-17 DIAGNOSIS — K648 Other hemorrhoids: Secondary | ICD-10-CM

## 2024-04-17 DIAGNOSIS — Z860100 Personal history of colon polyps, unspecified: Secondary | ICD-10-CM

## 2024-04-17 MED ORDER — SODIUM CHLORIDE 0.9 % IV SOLN: 500.0000 mL | Freq: Once | INTRAVENOUS | Status: DC

## 2024-04-17 NOTE — Op Note (Signed)
 [facility] Patient Name: Ashley Villarreal Procedure Date: 04/17/2024 9:32 AM MRN: [id number] Endoscopist: Gustav ALONSO Mcgee , MD, [id number] Age: 68 Referring MD:  Date of Birth: Jun 25, 1955 Gender: Female Account #: [id number] Procedure:                Colonoscopy Indications:              High risk colon cancer surveillance: Personal                            history of colonic polyps, High risk colon cancer                            surveillance: Personal history of adenoma less than                            10 mm in size Medicines:                Monitored Anesthesia Care Procedure:                Pre-Anesthesia Assessment:                           - Prior to the procedure, a History and Physical                            was performed, and patient medications and                            allergies were reviewed. The patient's tolerance of                            previous anesthesia was also reviewed. The risks                            and benefits of the procedure and the sedation                            options and risks were discussed with the patient.                            All questions were answered, and informed consent                            was obtained. Prior Anticoagulants: The patient has                            taken no anticoagulant or antiplatelet agents. ASA                            Grade Assessment: II - A patient with mild systemic                            disease. After reviewing the risks and benefits,  the patient was deemed in satisfactory condition to                            undergo the procedure.                           After obtaining informed consent, the colonoscope                            was passed under direct vision. Throughout the                            procedure, the patient's blood pressure, pulse, and                            oxygen saturations were monitored  continuously. The                            [id number]-HQ190L Colonoscope [id number] was introduced                            through the anus and advanced to the the cecum,                            identified by appendiceal orifice and ileocecal                            valve. The colonoscopy was performed without                            difficulty. The patient tolerated the procedure                            well. The quality of the bowel preparation was                            good. The ileocecal valve, appendiceal orifice, and                            rectum were photographed. Scope In: 9:43:10 AM Scope Out: 9:54:48 AM Scope Withdrawal Time: 0 hours 7 minutes 36 seconds  Total Procedure Duration: 0 hours 11 minutes 38 seconds  Findings:                 The perianal and digital rectal examinations were                            normal.                           Scattered small-mouthed diverticula were found in                            the sigmoid colon and ascending colon.  Non-bleeding external and internal hemorrhoids were                            found during retroflexion. The hemorrhoids were                            small. Complications:            No immediate complications. Estimated Blood Loss:     Estimated blood loss was minimal. Impression:               - Diverticulosis in the sigmoid colon and in the                            ascending colon.                           - Non-bleeding external and internal hemorrhoids.                           - No specimens collected. Recommendation:           - Resume previous diet.                           - Continue present medications.                           - Repeat colonoscopy in 10 years for surveillance. Athelene Hursey V. Chirstine Defrain, MD 04/17/2024 10:04:20 AM This report has been signed electronically.

## 2024-04-17 NOTE — Progress Notes (Unsigned)
 [facility] History and Physical   Primary Care Physician:  Copland, Harlene BROCKS, MD   Reason for Procedure:  History of adenomatous colon polyps  Plan:    Surveillance colonoscopy with possible interventions as needed     HPI: Ashley Villarreal is a very pleasant 68 y.o. female here for surveillance colonoscopy. Denies any nausea, vomiting, abdominal pain, melena or bright red blood per rectum  The risks and benefits as well as alternatives of endoscopic procedure(s) have been discussed and reviewed. All questions answered. The patient agrees to proceed.    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 Authorizing Provider  Accu-Chek Softclix Lancets lancets Check blood sugar once daily 12/08/19  Yes Copland, Jessica C, MD  amLODipine-valsartan (EXFORGE) 5-320 MG tablet TAKE 1 TABLET BY MOUTH EVERY DAY 02/22/24  Yes Copland, Harlene BROCKS, MD  aspirin  EC 81 MG tablet Take 1 tablet (81 mg total) by mouth daily. Swallow whole. 08/13/23  Yes Delford Maude BROCKS, MD  blood glucose meter kit and supplies Accucheck test strips and meter. Use up to four times daily as directed. (FOR ICD-10 E10.9, E11.9). 06/21/18  Yes Copland, Jessica  C, MD  Cholecalciferol (VITAMIN D3) 1.25 MG (50000 UT) CAPS Take 1 weekly for 12 weeks 05/29/20  Yes Copland, Harlene BROCKS, MD  glucose blood (ACCU-CHEK GUIDE TEST) test strip Use to check glucose 1 or 2 times daily as needed 12/22/23  Yes Copland, Harlene BROCKS, MD  glucose blood (ACCU-CHEK GUIDE) test strip USE TO CHECK BLOOD SUGAR ONCE DAILY 12/29/21  Yes Copland, Jessica C, MD  metFORMIN  (GLUCOPHAGE ) 500 MG tablet Take 1 tablet (500 mg total) by mouth daily. 06/07/23  Yes Copland, Harlene BROCKS, MD  mometasone  (NASONEX ) 50 MCG/ACT nasal spray Place 1-2 sprays into the nose 2 (two) times daily. 06/10/21  Yes Kozlow, Eric J, MD  NASAL SALINE NA Place 1 spray into the nose daily. Neilmed Nasal Saline Rinse to use 1-2 times daily.   Yes [provider]  pantoprazole  (PROTONIX ) 40 MG tablet Take 1 tablet (40 mg total) by mouth daily. 06/01/23  Yes Kozlow, Camellia PARAS, MD  rosuvastatin  (CRESTOR ) 20 MG tablet Take 1 tablet (20 mg total) by mouth daily. 06/07/23  Yes Copland, Harlene BROCKS, MD  aluminum chloride (DRYSOL) 20 % external solution Apply topically at bedtime. Patient not taking: No sig reported 12/03/22   Copland, Harlene BROCKS, MD  dupilumab  (DUPIXENT ) 300 MG/2ML prefilled syringe INJECT 1 SYRINGE UNDER THE SKIN EVERY OTHER WEEK 01/07/23   Kozlow, Camellia  J, MD  EPINEPHrine  (EPIPEN  2-PAK) 0.3 mg/0.3 mL IJ SOAJ injection Inject 0.3 mg into the muscle as needed for anaphylaxis. 06/10/21   Kozlow, Camellia PARAS, MD  FLOVENT  HFA 110 MCG/ACT inhaler Inhale 2 puffs into the lungs 2 (two) times daily. 12/08/21   Kozlow, Camellia PARAS, MD  Hydroquinone-Sunscreens (ALPHAQUIN HP) 4 % CREA APPLY TO DARK SPOTS DAILY. 12/05/15   [provider]  levalbuterol  (XOPENEX  HFA) 45 MCG/ACT inhaler TAKE 2 PUFFS BY MOUTH EVERY 6 HOURS AS NEEDED FOR WHEEZE 06/25/23   Kozlow, Camellia PARAS, MD    Current Outpatient Medications  Medication Sig Dispense Refill   Accu-Chek Softclix Lancets lancets Check blood sugar once daily 100 each 12    amLODipine-valsartan (EXFORGE) 5-320 MG tablet TAKE 1 TABLET BY MOUTH EVERY DAY 90 tablet 3   aspirin  EC 81 MG tablet Take 1 tablet (81 mg total) by mouth daily. Swallow whole.     blood glucose meter kit and supplies Accucheck test strips and meter. Use up to four times daily as directed. (FOR ICD-10 E10.9, E11.9). 1 each 0   Cholecalciferol (VITAMIN D3) 1.25 MG (50000 UT) CAPS Take 1 weekly for 12 weeks 12 capsule 0   glucose blood (ACCU-CHEK GUIDE TEST) test strip Use to check glucose 1 or 2 times daily as needed 100 each 12   glucose blood (ACCU-CHEK GUIDE) test strip USE TO CHECK BLOOD SUGAR ONCE DAILY 50 strip 12   metFORMIN  (GLUCOPHAGE ) 500 MG tablet Take 1 tablet (500 mg total) by mouth daily. 90 tablet 3   mometasone  (NASONEX ) 50 MCG/ACT nasal spray Place 1-2 sprays into the nose 2 (two) times daily. 17 g 5   NASAL SALINE NA Place 1 spray into the nose daily. Neilmed Nasal Saline Rinse to use 1-2 times daily.     pantoprazole  (PROTONIX ) 40 MG tablet Take 1 tablet (40 mg total) by mouth daily. 90 tablet 3   rosuvastatin  (CRESTOR ) 20 MG tablet Take 1 tablet (20 mg total) by mouth daily. 90 tablet 3   aluminum chloride (DRYSOL) 20 % external solution Apply topically at bedtime. (Patient not taking: No sig reported) 60 mL 2   dupilumab  (DUPIXENT ) 300 MG/2ML prefilled syringe INJECT 1 SYRINGE UNDER THE SKIN EVERY OTHER WEEK 12 mL 3   EPINEPHrine  (EPIPEN  2-PAK) 0.3 mg/0.3 mL IJ SOAJ injection Inject 0.3 mg into the muscle as needed for anaphylaxis. 1 each 1   FLOVENT  HFA 110 MCG/ACT inhaler Inhale 2 puffs into the lungs 2 (two) times daily. 1 each 5   Hydroquinone-Sunscreens (ALPHAQUIN HP) 4 % CREA APPLY TO DARK SPOTS DAILY.  3   levalbuterol  (XOPENEX  HFA) 45 MCG/ACT inhaler TAKE 2 PUFFS BY MOUTH EVERY 6 HOURS AS NEEDED FOR WHEEZE 15 g 1   Current Facility-Administered Medications  Medication Dose Route Frequency Provider Last Rate Last Admin   0.9 %  sodium chloride  infusion  500 mL  Intravenous Once Tashara Suder V, MD       dupilumab  (DUPIXENT ) prefilled syringe 300 mg  300 mg Subcutaneous Q14 Days Kozlow, Eric J, MD   300 mg at 04/05/24 1537    Allergies as of 04/17/2024 - Review Complete 04/17/2024  Allergen Reaction Noted   Doxycycline  Nausea Only 11/24/2014   Loratadine Other (See Comments) 08/10/2011   Other Other (See Comments) 10/03/2012    Family History  Problem Relation Age of Onset   Stroke Father    Heart disease Father    Diabetes Mother    Heart disease  Mother    Stroke Mother    Hyperlipidemia Sister    Hyperlipidemia Brother    Diabetes Paternal Grandmother    Hypertension Brother    Colon cancer Neg Hx    Allergic rhinitis Neg Hx    Angioedema Neg Hx    Asthma Neg Hx    Atopy Neg Hx    Eczema Neg Hx    Immunodeficiency Neg Hx    Urticaria Neg Hx     Social History   Socioeconomic History   Marital status: Married    Spouse name: Not on file   Number of children: Not on file   Years of education: Not on file   Highest education level: Bachelor's degree (e.g., BA, AB, BS)  Occupational History   Occupation: Agricultural Engineer: [employer]  Tobacco Use   Smoking status: Never   Smokeless tobacco: Never  Vaping Use   Vaping status: Never Used  Substance and Sexual Activity   Alcohol use: No    Alcohol/week: 0.0 standard drinks of alcohol   Drug use: No   Sexual activity: Yes    Partners: Male    Birth control/protection: Condom  Other Topics Concern   Not on file  Social History Narrative   Married. Education: college.   Social Drivers of Corporate Investment Banker Strain: Low Risk  (06/06/2023)   Overall Financial Resource Strain (CARDIA)    Difficulty of Paying Living Expenses: Not hard at all  Food Insecurity: No Food Insecurity (06/06/2023)   Hunger Vital Sign    Worried About Running Out of Food in the Last Year: Never true    Ran Out of Food in the Last Year: Never true  Transportation Needs: No  Transportation Needs (06/06/2023)   PRAPARE - Administrator, Civil Service (Medical): No    Lack of Transportation (Non-Medical): No  Physical Activity: Sufficiently Active (06/06/2023)   Exercise Vital Sign    Days of Exercise per Week: 7 days    Minutes of Exercise per Session: 90 min  Stress: No Stress Concern Present (06/06/2023)   [employer] of Occupational Health - Occupational Stress Questionnaire    Feeling of Stress : Not at all  Social Connections: Socially Integrated (06/06/2023)   Social Connection and Isolation Panel    Frequency of Communication with Friends and Family: More than three times a week    Frequency of Social Gatherings with Friends and Family: More than three times a week    Attends Religious Services: More than 4 times per year    Active Member of [employer] or Organizations: Yes    Attends Banker Meetings: More than 4 times per year    Marital Status: Married  Catering Manager Violence: Not At Risk (05/18/2023)   Humiliation, Afraid, Rape, and Kick questionnaire    Fear of Current or Ex-Partner: No    Emotionally Abused: No    Physically Abused: No    Sexually Abused: No    Review of Systems:  All other review of systems negative except as mentioned in the HPI.  Physical Exam: Vital signs in last 24 hours: BP (!) 142/66   Pulse 60   Temp (!) 97 F (36.1 C) (Temporal)   Ht 5' 8 (1.727 m)   Wt 150 lb (68 kg)   SpO2 100%   BMI 22.81 kg/m  General:   Alert, NAD Lungs:  Clear .   Heart:  Regular rate and rhythm Abdomen:  Soft,  nontender and nondistended. Neuro/Psych:  Alert and cooperative. Normal mood and affect. A and O x 3  Reviewed labs, radiology imaging, old records and pertinent past GI work up  Patient is appropriate for planned procedure(s) and anesthesia in an ambulatory setting   K. Veena Sinai Mahany , MD [phone]

## 2024-04-17 NOTE — Progress Notes (Unsigned)
 Pt's states no medical or surgical changes since previsit or office visit.

## 2024-04-17 NOTE — Progress Notes (Unsigned)
 Sedate, gd SR, tolerated procedure well, VSS, report to RN

## 2024-04-17 NOTE — Patient Instructions (Signed)
 Thank you for letting us take care of your healthcare needs today. Please see handouts given to you on Diverticulosis and Hemorrhoids.    YOU HAD AN ENDOSCOPIC PROCEDURE TODAY AT THE [facility]:   Refer to the procedure report that was given to you for any specific questions about what was found during the examination.  If the procedure report does not answer your questions, please call your gastroenterologist to clarify.  If you requested that your care partner not be given the details of your procedure findings, then the procedure report has been included in a sealed envelope for you to review at your convenience later.  YOU SHOULD EXPECT: Some feelings of bloating in the abdomen. Passage of more gas than usual.  Walking can help get rid of the air that was put into your GI tract during the procedure and reduce the bloating. If you had a lower endoscopy (such as a colonoscopy or flexible sigmoidoscopy) you may notice spotting of blood in your stool or on the toilet paper. If you underwent a bowel prep for your procedure, you may not have a normal bowel movement for a few days.  Please Note:  You might notice some irritation and congestion in your nose or some drainage.  This is from the oxygen used during your procedure.  There is no need for concern and it should clear up in a day or so.  SYMPTOMS TO REPORT IMMEDIATELY:  Following lower endoscopy (colonoscopy or flexible sigmoidoscopy):  Excessive amounts of blood in the stool  Significant tenderness or worsening of abdominal pains  Swelling of the abdomen that is new, acute  Fever of 100F or higher   For urgent or emergent issues, a gastroenterologist can be reached at any hour by calling [phone]. Do not use MyChart messaging for urgent concerns.    DIET:  We do recommend a small meal at first, but then you may proceed to your regular diet.  Drink plenty of fluids but you should avoid alcoholic beverages for 24  hours.  ACTIVITY:  You should plan to take it easy for the rest of today and you should NOT DRIVE or use heavy machinery until tomorrow (because of the sedation medicines used during the test).    FOLLOW UP: Our staff will call the number listed on your records the next business day following your procedure.  We will call around 7:15- 8:00 am to check on you and address any questions or concerns that you may have regarding the information given to you following your procedure. If we do not reach you, we will leave a message.     If any biopsies were taken you will be contacted by phone or by letter within the next 1-3 weeks.  Please call us at [phone] if you have not heard about the biopsies in 3 weeks.    SIGNATURES/CONFIDENTIALITY: You and/or your care partner have signed paperwork which will be entered into your electronic medical record.  These signatures attest to the fact that that the information above on your After Visit Summary has been reviewed and is understood.  Full responsibility of the confidentiality of this discharge information lies with you and/or your care-partner.

## 2024-04-18 ENCOUNTER — Telehealth: Payer: Self-pay

## 2024-04-18 NOTE — Telephone Encounter (Signed)
Attempted to reach patient for post-procedure f/u call. No answer. Left message for her to please not hesitate to call if she has questions/concerns regarding her care.

## 2024-04-19 ENCOUNTER — Ambulatory Visit

## 2024-04-19 DIAGNOSIS — J4540 Moderate persistent asthma, uncomplicated: Secondary | ICD-10-CM

## 2024-05-03 ENCOUNTER — Ambulatory Visit

## 2024-05-03 DIAGNOSIS — J4540 Moderate persistent asthma, uncomplicated: Secondary | ICD-10-CM | POA: Diagnosis not present

## 2024-05-03 NOTE — Progress Notes (Addendum)
 Ashley Villarreal                                          MRN: [id number]   05/03/2024   The [facility] Quality Team Specialist reviewed this patient medical record for the purposes of chart review for care gap closure. The following were reviewed: chart review for care gap closure-kidney health evaluation for diabetes:eGFR  and uACR APPT 12/4, needs uACR  05/31/2024- appt 12/14  06/20/2024- ABSTRACTED KED    [facility] Quality Team

## 2024-05-15 ENCOUNTER — Ambulatory Visit (INDEPENDENT_AMBULATORY_CARE_PROVIDER_SITE_OTHER)

## 2024-05-15 DIAGNOSIS — J4540 Moderate persistent asthma, uncomplicated: Secondary | ICD-10-CM

## 2024-05-25 ENCOUNTER — Ambulatory Visit: Payer: Medicare Other

## 2024-05-25 VITALS — Ht 68.0 in | Wt 151.0 lb

## 2024-05-25 DIAGNOSIS — Z0000 Encounter for general adult medical examination without abnormal findings: Secondary | ICD-10-CM | POA: Diagnosis not present

## 2024-05-25 NOTE — Patient Instructions (Addendum)
 Ashley Villarreal,  Thank you for taking the time for your Medicare Wellness Visit. I appreciate your continued commitment to your health goals. Please review the care plan we discussed, and feel free to reach out if I can assist you further.  Please note that Annual Wellness Visits do not include a physical exam. Some assessments may be limited, especially if the visit was conducted virtually. If needed, we may recommend an in-person follow-up with your provider.  Ongoing Care Seeing your primary care provider every 3 to 6 months helps us  monitor your health and provide consistent, personalized care.   Referrals If a referral was made during today's visit and you haven't received any updates within two weeks, please contact the referred provider directly to check on the status.  Recommended Screenings:  Health Maintenance  Topic Date Due   Yearly kidney health urinalysis for diabetes  Never done   Flu Shot  01/21/2024   COVID-19 Vaccine (6 - 2025-26 season) 02/21/2024   Yearly kidney function blood test for diabetes  06/06/2024   Hemoglobin A1C  06/13/2024   Complete foot exam   12/12/2024   Eye exam for diabetics  02/21/2025   Medicare Annual Wellness Visit  05/25/2025   Breast Cancer Screening  08/16/2025   DTaP/Tdap/Td vaccine (3 - Td or Tdap) 12/02/2032   Colon Cancer Screening  04/17/2034   Pneumococcal Vaccine for age over 44  Completed   Osteoporosis screening with Bone Density Scan  Completed   Hepatitis C Screening  Completed   Zoster (Shingles) Vaccine  Completed   Meningitis B Vaccine  Aged Out       05/25/2024    2:51 PM  Advanced Directives  Does Patient Have a Medical Advance Directive? No  Would patient like information on creating a medical advance directive? No - Patient declined    Vision: Annual vision screenings are recommended for early detection of glaucoma, cataracts, and diabetic retinopathy. These exams can also reveal signs of chronic conditions such as  diabetes and high blood pressure.  Dental: Annual dental screenings help detect early signs of oral cancer, gum disease, and other conditions linked to overall health, including heart disease and diabetes.  Please see the attached documents for additional preventive care recommendations.

## 2024-05-25 NOTE — Progress Notes (Signed)
 Chief Complaint  Patient presents with   Medicare Wellness     Subjective:   Ashley Villarreal is a 68 y.o. female who presents for a Medicare Annual Wellness Visit.  Visit info / Clinical Intake: Medicare Wellness Visit Type:: Subsequent Annual Wellness Visit Persons participating in visit and providing information:: patient Medicare Wellness Visit Mode:: Telephone If telephone:: video declined Since this visit was completed virtually, some vitals may be partially provided or unavailable. Missing vitals are due to the limitations of the virtual format.: Documented vitals are patient reported If Telephone or Video please confirm:: I connected with patient using audio/video enable telemedicine. I verified patient identity with two identifiers, discussed telehealth limitations, and patient agreed to proceed. Patient Location:: Home Provider Location:: Office Interpreter Needed?: No Pre-visit prep was completed: no AWV questionnaire completed by patient prior to visit?: no Living arrangements:: lives with spouse/significant other Patient's Overall Health Status Rating: excellent Typical amount of pain: none Does pain affect daily life?: no Are you currently prescribed opioids?: no  Dietary Habits and Nutritional Risks How many meals a day?: 3 Eats fruit and vegetables daily?: yes Most meals are obtained by: preparing own meals In the last 2 weeks, have you had any of the following?: none Diabetic:: (!) yes Any non-healing wounds?: no How often do you check your BS?: 1 (Weekly) Would you like to be referred to a Nutritionist or for Diabetic Management? : no  Functional Status Activities of Daily Living (to include ambulation/medication): Independent Ambulation: Independent with device- listed below Home Assistive Devices/Equipment: Eyeglasses; CPAP Medication Administration: Independent Home Management (perform basic housework or laundry): Independent Manage your own  finances?: yes Primary transportation is: driving Concerns about vision?: no *vision screening is required for WTM* Concerns about hearing?: no  Fall Screening Falls in the past year?: 0 Number of falls in past year: 0 Was there an injury with Fall?: 0 Fall Risk Category Calculator: 0 Patient Fall Risk Level: Low Fall Risk  Fall Risk Patient at Risk for Falls Due to: No Fall Risks  Home and Transportation Safety: All rugs have non-skid backing?: yes All stairs or steps have railings?: yes Grab bars in the bathtub or shower?: (!) no Have non-skid surface in bathtub or shower?: (!) no Good home lighting?: yes Regular seat belt use?: yes Hospital stays in the last year:: no  Cognitive Assessment Difficulty concentrating, remembering, or making decisions? : no Will 6CIT or Mini Cog be Completed: yes What year is it?: 0 points What month is it?: 0 points Give patient an address phrase to remember (5 components): [address] [place] what time is it?: 0 points Count backwards from 20 to 1: 0 points Say the months of the year in reverse: 0 points Repeat the address phrase from earlier: 0 points 6 CIT Score: 0 points  Advance Directives (For Healthcare) Does Patient Have a Medical Advance Directive?: No Would patient like information on creating a medical advance directive?: No - Patient declined  Reviewed/Updated  Reviewed/Updated: Reviewed All (Medical, Surgical, Family, Medications, Allergies, Care Teams, Patient Goals)    Allergies (verified) Doxycycline , Loratadine, and Other   Current Medications (verified) Outpatient Encounter Medications as of 05/25/2024  Medication Sig   Accu-Chek Softclix Lancets lancets Check blood sugar once daily   aluminum chloride (DRYSOL) 20 % external solution Apply topically at bedtime. (Patient not taking: No sig reported)   amLODipine-valsartan (EXFORGE) 5-320 MG tablet TAKE 1 TABLET BY MOUTH EVERY DAY   aspirin  EC 81  MG  tablet Take 1 tablet (81 mg total) by mouth daily. Swallow whole.   blood glucose meter kit and supplies Accucheck test strips and meter. Use up to four times daily as directed. (FOR ICD-10 E10.9, E11.9).   Cholecalciferol (VITAMIN D3) 1.25 MG (50000 UT) CAPS Take 1 weekly for 12 weeks   dupilumab  (DUPIXENT ) 300 MG/2ML prefilled syringe INJECT 1 SYRINGE UNDER THE SKIN EVERY OTHER WEEK   EPINEPHrine  (EPIPEN  2-PAK) 0.3 mg/0.3 mL IJ SOAJ injection Inject 0.3 mg into the muscle as needed for anaphylaxis.   FLOVENT  HFA 110 MCG/ACT inhaler Inhale 2 puffs into the lungs 2 (two) times daily.   glucose blood (ACCU-CHEK GUIDE TEST) test strip Use to check glucose 1 or 2 times daily as needed   glucose blood (ACCU-CHEK GUIDE) test strip USE TO CHECK BLOOD SUGAR ONCE DAILY   Hydroquinone-Sunscreens (ALPHAQUIN HP) 4 % CREA APPLY TO DARK SPOTS DAILY.   levalbuterol  (XOPENEX  HFA) 45 MCG/ACT inhaler TAKE 2 PUFFS BY MOUTH EVERY 6 HOURS AS NEEDED FOR WHEEZE   metFORMIN  (GLUCOPHAGE ) 500 MG tablet Take 1 tablet (500 mg total) by mouth daily.   mometasone  (NASONEX ) 50 MCG/ACT nasal spray Place 1-2 sprays into the nose 2 (two) times daily.   NASAL SALINE NA Place 1 spray into the nose daily. Neilmed Nasal Saline Rinse to use 1-2 times daily.   pantoprazole  (PROTONIX ) 40 MG tablet Take 1 tablet (40 mg total) by mouth daily.   rosuvastatin  (CRESTOR ) 20 MG tablet Take 1 tablet (20 mg total) by mouth daily.   Facility-Administered Encounter Medications as of 05/25/2024  Medication   dupilumab  (DUPIXENT ) prefilled syringe 300 mg    History: Past Medical History:  Diagnosis Date   Allergic rhinoconjunctivitis    Allergy    Allergy with anaphylaxis due to food    Asthma    CKD (chronic kidney disease)    COPD (chronic obstructive pulmonary disease) (HCC)    Diabetes mellitus without complication (HCC)    DM (diabetes mellitus) (HCC)    Granuloma of tympanic membrane of left ear    Hypertension    Myringotomy  tube status    Nasal polyposis    OSA (obstructive sleep apnea)    Osteopenia    Otorrhagia    Pansinusitis    Sleep apnea    uses CPAP   Uterine leiomyoma    Past Surgical History:  Procedure Laterality Date   COLONOSCOPY     DILATION AND CURETTAGE OF UTERUS     NASAL SINUS SURGERY     TONSILLECTOMY  1975   TYMPANOSTOMY TUBE PLACEMENT     Family History  Problem Relation Age of Onset   Stroke Father    Heart disease Father    Diabetes Mother    Heart disease Mother    Stroke Mother    Hyperlipidemia Sister    Hyperlipidemia Brother    Diabetes Paternal Grandmother    Hypertension Brother    Colon cancer Neg Hx    Allergic rhinitis Neg Hx    Angioedema Neg Hx    Asthma Neg Hx    Atopy Neg Hx    Eczema Neg Hx    Immunodeficiency Neg Hx    Urticaria Neg Hx    Social History   Occupational History   Occupation: Agricultural Engineer: [employer]  Tobacco Use   Smoking status: Never   Smokeless tobacco: Never  Vaping Use   Vaping status: Never Used  Substance and Sexual Activity  Alcohol use: No    Alcohol/week: 0.0 standard drinks of alcohol   Drug use: No   Sexual activity: Yes    Partners: Male    Birth control/protection: Condom   Tobacco Counseling Counseling given: No  SDOH Screenings   Food Insecurity: No Food Insecurity (05/25/2024)  Housing: Unknown (05/25/2024)  Transportation Needs: No Transportation Needs (05/25/2024)  Utilities: Not At Risk (05/25/2024)  Alcohol Screen: Low Risk  (05/17/2023)  Depression (PHQ2-9): Low Risk  (05/25/2024)  Financial Resource Strain: Low Risk  (06/06/2023)  Physical Activity: Sufficiently Active (05/25/2024)  Social Connections: Socially Integrated (05/25/2024)  Stress: No Stress Concern Present (05/25/2024)  Tobacco Use: Low Risk  (05/25/2024)  Health Literacy: Adequate Health Literacy (05/25/2024)   See flowsheets for full screening details  Depression Screen PHQ 2 & 9 Depression Scale- Over the past 2 weeks,  how often have you been bothered by any of the following problems? Little interest or pleasure in doing things: 0 Feeling down, depressed, or hopeless (PHQ Adolescent also includes...irritable): 0 PHQ-2 Total Score: 0     Goals Addressed               This Visit's Progress     Continue physical activity (pt-stated)        Remain active             Objective:    Today's Vitals   05/25/24 1448  Weight: 151 lb (68.5 kg)  Height: 5' 8 (1.727 m)   Body mass index is 22.96 kg/m.  Hearing/Vision screen Hearing Screening - Comments:: Denies hearing difficulties   Vision Screening - Comments:: Wears rx glasses - up to date with routine eye exams with  Dr Gaylia Immunizations and Health Maintenance Health Maintenance  Topic Date Due   Diabetic kidney evaluation - Urine ACR  Never done   Influenza Vaccine  01/21/2024   COVID-19 Vaccine (6 - 2025-26 season) 02/21/2024   Diabetic kidney evaluation - eGFR measurement  06/06/2024   HEMOGLOBIN A1C  06/13/2024   FOOT EXAM  12/12/2024   OPHTHALMOLOGY EXAM  02/21/2025   Medicare Annual Wellness (AWV)  05/25/2025   Mammogram  08/16/2025   DTaP/Tdap/Td (3 - Td or Tdap) 12/02/2032   Colonoscopy  04/17/2034   Pneumococcal Vaccine: 50+ Years  Completed   Bone Density Scan  Completed   Hepatitis C Screening  Completed   Zoster Vaccines- Shingrix  Completed   Meningococcal B Vaccine  Aged Out        Assessment/Plan:  This is a routine wellness examination for Carlis.  Patient Care Team: Copland, Harlene BROCKS, MD as PCP - General (Family Medicine) Delford Maude BROCKS, MD as PCP - Cardiology (Cardiology) Neysa Reggy BIRCH, MD as Consulting Physician (Pulmonary Disease)  I have personally reviewed and noted the following in the patient's chart:   Medical and social history Use of alcohol, tobacco or illicit drugs  Current medications and supplements including opioid prescriptions. Functional ability and status Nutritional  status Physical activity Advanced directives List of other physicians Hospitalizations, surgeries, and ER visits in previous 12 months Vitals Screenings to include cognitive, depression, and falls Referrals and appointments  No orders of the defined types were placed in this encounter.  In addition, I have reviewed and discussed with patient certain preventive protocols, quality metrics, and best practice recommendations. A written personalized care plan for preventive services as well as general preventive health recommendations were provided to patient.   Rojelio LELON Blush, LPN   [id number]  Return in 1 year on 05/31/25  After Visit Summary: (MyChart) Due to this being a telephonic visit, the after visit summary with patients personalized plan was offered to patient via MyChart   Nurse Notes: None

## 2024-05-30 ENCOUNTER — Ambulatory Visit

## 2024-05-30 ENCOUNTER — Ambulatory Visit: Payer: Medicare Other | Admitting: Allergy and Immunology

## 2024-05-30 ENCOUNTER — Encounter: Payer: Self-pay | Admitting: Allergy and Immunology

## 2024-05-30 ENCOUNTER — Other Ambulatory Visit: Payer: Self-pay

## 2024-05-30 VITALS — BP 126/82 | HR 72 | Temp 97.8°F | Resp 16 | Ht 68.0 in | Wt 159.2 lb

## 2024-05-30 DIAGNOSIS — J4540 Moderate persistent asthma, uncomplicated: Secondary | ICD-10-CM | POA: Diagnosis not present

## 2024-05-30 DIAGNOSIS — J324 Chronic pansinusitis: Secondary | ICD-10-CM | POA: Diagnosis not present

## 2024-05-30 DIAGNOSIS — J4550 Severe persistent asthma, uncomplicated: Secondary | ICD-10-CM | POA: Diagnosis not present

## 2024-05-30 DIAGNOSIS — J3089 Other allergic rhinitis: Secondary | ICD-10-CM

## 2024-05-30 DIAGNOSIS — K219 Gastro-esophageal reflux disease without esophagitis: Secondary | ICD-10-CM | POA: Diagnosis not present

## 2024-05-30 DIAGNOSIS — J339 Nasal polyp, unspecified: Secondary | ICD-10-CM | POA: Diagnosis not present

## 2024-05-30 MED ORDER — PANTOPRAZOLE SODIUM 40 MG PO TBEC: 40.0000 mg | DELAYED_RELEASE_TABLET | Freq: Every day | ORAL | 3 refills | Status: AC

## 2024-05-30 NOTE — Patient Instructions (Signed)
  1.  Continue OTC Nasonex  - 1-2 sprays each nostril 1-2 times per day     2.  Continue Dupilumab  injections every 2 weeks  3.  Continue pantoprazole  40 mg daily  4.  Continue Xopenex  HFA if needed  5.  Influenza = Tamiflu. Covid = Paxlovid  6.  Return to clinic in 12 months or earlier if problem

## 2024-05-30 NOTE — Progress Notes (Unsigned)
 [place] [facility] [place] - [place] - [place] - [place]   Follow-up Note  Referring Provider: Watt Harlene BROCKS, MD Primary Provider: Watt Harlene BROCKS, MD Date of Office Visit: 05/30/2024  Subjective:   Ashley Villarreal (DOB: [birth year]) is a 68 y.o. female who [facility] in re-evaluation of the following:  HPI: Ashley Villarreal returns to this clinic in evaluation of asthma, chronic sinusitis, nasal polyposis, reflux.  I last saw her in this clinic 01 June 2023.  Using dupilumab  every [redacted] weeks along with a nasal steroid has resulted in significant control of both her upper and lower airway.  She does not really have any symptoms involving her upper airway and she can smell and taste with no problem.  She has no symptoms involving her lower airway and she can exercise and have cold air exposure with no problem and rarely uses a short acting bronchodilator.  She has not required a systemic steroid or an antibiotic for any type of airway issue.  She believes that her reflux is under very good control on her current plan.  Allergies as of 05/30/2024       Reactions   Doxycycline  Nausea Only   Loratadine Other (See Comments)   Dizziness   Other Other (See Comments)   Magic mouthwash  Patient get blisters on her lips        Medication List    Accu-Chek Guide test strip Generic drug: glucose blood USE TO CHECK BLOOD SUGAR ONCE DAILY   Accu-Chek Guide Test test strip Generic drug: glucose blood Use to check glucose 1 or 2 times daily as needed   Accu-Chek Softclix Lancets lancets Check blood sugar once daily   Alphaquin HP 4 % Crea APPLY TO DARK SPOTS DAILY.   amLODipine-valsartan 5-320 MG tablet Commonly known as: EXFORGE TAKE 1 TABLET BY MOUTH EVERY DAY   aspirin  EC 81 MG tablet Take 1 tablet (81 mg total) by mouth daily. Swallow whole.   blood glucose meter kit and supplies Accucheck test strips and meter. Use up to four  times daily as directed. (FOR ICD-10 E10.9, E11.9).   Drysol 20 % external solution Generic drug: aluminum chloride Apply topically at bedtime.   Dupixent  300 MG/2ML prefilled syringe Generic drug: dupilumab  INJECT 1 SYRINGE UNDER THE SKIN EVERY OTHER WEEK   EPINEPHrine  0.3 mg/0.3 mL Soaj injection Commonly known as: EpiPen  2-Pak Inject 0.3 mg into the muscle as needed for anaphylaxis.   Flovent  HFA 110 MCG/ACT inhaler Generic drug: fluticasone  Inhale 2 puffs into the lungs 2 (two) times daily.   levalbuterol  45 MCG/ACT inhaler Commonly known as: XOPENEX  HFA TAKE 2 PUFFS BY MOUTH EVERY 6 HOURS AS NEEDED FOR WHEEZE   metFORMIN  500 MG tablet Commonly known as: GLUCOPHAGE  Take 1 tablet (500 mg total) by mouth daily.   mometasone  50 MCG/ACT nasal spray Commonly known as: NASONEX  Place 1-2 sprays into the nose 2 (two) times daily.   NASAL SALINE NA Place 1 spray into the nose daily. Neilmed Nasal Saline Rinse to use 1-2 times daily.   pantoprazole  40 MG tablet Commonly known as: PROTONIX  Take 1 tablet (40 mg total) by mouth daily.   rosuvastatin  20 MG tablet Commonly known as: CRESTOR  Take 1 tablet (20 mg total) by mouth daily.   Vitamin D3 1.25 MG (50000 UT) Caps Take 1 weekly for 12 weeks    Past Medical History:  Diagnosis Date   Allergic rhinoconjunctivitis    Allergy  Allergy with anaphylaxis due to food    Asthma    CKD (chronic kidney disease)    COPD (chronic obstructive pulmonary disease) (HCC)    Diabetes mellitus without complication (HCC)    DM (diabetes mellitus) (HCC)    Granuloma of tympanic membrane of left ear    Hypertension    Myringotomy tube status    Nasal polyposis    OSA (obstructive sleep apnea)    Osteopenia    Otorrhagia    Pansinusitis    Sleep apnea    uses CPAP   Uterine leiomyoma     Past Surgical History:  Procedure Laterality Date   COLONOSCOPY     DILATION AND CURETTAGE OF UTERUS     NASAL SINUS SURGERY      TONSILLECTOMY  1975   TYMPANOSTOMY TUBE PLACEMENT      Review of systems negative except as noted in HPI / PMHx or noted below:  Review of Systems  Constitutional: Negative.   HENT: Negative.    Eyes: Negative.   Respiratory: Negative.    Cardiovascular: Negative.   Gastrointestinal: Negative.   Genitourinary: Negative.   Musculoskeletal: Negative.   Skin: Negative.   Neurological: Negative.   Endo/Heme/Allergies: Negative.   Psychiatric/Behavioral: Negative.       Objective:   Vitals:   05/30/24 1454  BP: 126/82  Pulse: 72  Resp: 16  Temp: 97.8 F (36.6 C)  SpO2: 98%   Height: 5' 8 (172.7 cm)  Weight: 159 lb 3.2 oz (72.2 kg)   Physical Exam Constitutional:      Appearance: She is not diaphoretic.  HENT:     Head: Normocephalic.     Right Ear: Tympanic membrane, ear canal and external ear normal.     Left Ear: Tympanic membrane, ear canal and external ear normal.     Nose: Nose normal. No mucosal edema or rhinorrhea.     Mouth/Throat:     Pharynx: Uvula midline. No oropharyngeal exudate.  Eyes:     Conjunctiva/sclera: Conjunctivae normal.  Neck:     Thyroid : No thyromegaly.     Trachea: Trachea normal. No tracheal tenderness or tracheal deviation.  Cardiovascular:     Rate and Rhythm: Normal rate and regular rhythm.     Heart sounds: Normal heart sounds, S1 normal and S2 normal. No murmur heard. Pulmonary:     Effort: No respiratory distress.     Breath sounds: Normal breath sounds. No stridor. No wheezing or rales.  Lymphadenopathy:     Head:     Right side of head: No tonsillar adenopathy.     Left side of head: No tonsillar adenopathy.     Cervical: No cervical adenopathy.  Skin:    Findings: No erythema or rash.     Nails: There is no clubbing.  Neurological:     Mental Status: She is alert.     Diagnostics: Spirometry was performed and demonstrated an FEV1 of 1.80 at 80 % of predicted.  Assessment and Plan:   1. Asthma, severe  persistent, well-controlled (HCC)   2. Other allergic rhinitis   3. Chronic pansinusitis   4. Nasal polyposis   5. Gastroesophageal reflux disease without esophagitis     1.  Continue OTC Nasonex  - 1-2 sprays each nostril 1-2 times per day     2.  Continue Dupilumab  injections every 2 weeks  3.  Continue pantoprazole  40 mg daily  4.  Continue Xopenex  HFA if needed  5.  Influenza = Tamiflu.  Covid = Paxlovid  6.  Return to clinic in 12 months or earlier if problem  Minerva is really doing very well on her current plan of using anti-IL-4/13 biologic agent to address her multiorgan inflammatory state including asthma and rhinitis and nasal polyposis.  She will continue on this agent along with a nasal steroid.  And her reflux appears to be doing quite well on a proton pump inhibitor.  Assuming she does well with this plan noted above I will see her back in this clinic in 1 year or earlier if there is a problem.  Camellia Denis, MD Allergy / Immunology  [facility]

## 2024-05-31 ENCOUNTER — Encounter: Payer: Self-pay | Admitting: Allergy and Immunology

## 2024-06-03 NOTE — Patient Instructions (Signed)
 It was good to see you again today, I will be in touch with your labs  Please set up a visit with your GI doc- Dr Katrinka Blazing - for your screening colonoscopy  [phone]  Please check with your insurance about coverage of GLP-1 agonist drugs for weight loss.  These drugs would include Saxenda, Wegovy, Zepbound  Please clarify with your insurance if they cover this for weight loss as opposed to diabetes.  If one of these medications is covered I am more than happy to prescribe you

## 2024-06-03 NOTE — Progress Notes (Unsigned)
 [facility] [address], [address] [place], [place] [address] [phone]  Date:  06/05/2024   Name:  Ashley Villarreal   DOB:  Jun 21, 1956   MRN:  [id number]  PCP:  Watt Harlene BROCKS, MD    Chief Complaint: No chief complaint on file.   History of Present Illness:  Ashley Villarreal is a 68 y.o. very pleasant female patient who presents with the following:  Patient seen today for periodic follow-up I saw her most recently in June of this year History of hypertension, asthma, sleep apnea, well controlled diabetes with microalbuminuria, hyperlipidemia, chronic kidney disease, vitamin D  deficiency. She was previously seeing nephrology, but they released her due to stable renal function. She sees Dr Kozlow for allergy/ asthma-undergoing immunotherapy   She enjoys walking and a little bit of jogging for exercise  She saw her pulmonologist, Dr. Neysa in [place] is doing well with CPAP for sleep apnea She had a cardiac PET scan April 2025-normal, low risk  Flu shot Can get a COVID booster Urine micro, GFR due Can update labs today Shingrix, pneumonia series is complete Mammogram done February 2025 DEXA December 2024 Pap smear 12/23  Amlodipine /valsartan  Metformin  Pantoprazole  Crestor  Dupixent  Flovent  Xopenex  as needed Baby aspirin  Lab Results  Component Value Date   HGBA1C 6.8 (H) 12/13/2023   Discussed the use of AI scribe software for clinical note transcription with the patient, who gave verbal consent to proceed.  History of Present Illness    Patient Active Problem List   Diagnosis Date Noted   Otorrhagia, left 06/30/2021   Granuloma of tympanic membrane of left ear 06/30/2021   Osteopenia 06/05/2021   Myringotomy tube status 06/27/2018   CKD (chronic kidney disease) stage 3, GFR 30-59 ml/min (HCC) 04/13/2018   Chronic mucoid otitis media of left ear 03/11/2018   Chronic pansinusitis 01/07/2018    Nasal polyposis 01/07/2018   Bilateral sensorineural hearing loss 12/08/2017   Gastroesophageal reflux disease without esophagitis 06/09/2017   Anosmia 06/09/2017   Asthma, severe persistent, well-controlled (HCC) 06/12/2015   LPRD (laryngopharyngeal reflux disease) 06/12/2015   Allergic rhinoconjunctivitis 06/12/2015   Allergy with anaphylaxis due to food 06/12/2015   OSA (obstructive sleep apnea)    Uterine leiomyoma 11/08/2011   Hyperlipemia 08/10/2011   Hypertensive heart disease    Diabetes mellitus type 2, noninsulin dependent (HCC)     Past Medical History:  Diagnosis Date   Allergic rhinoconjunctivitis    Allergy    Allergy with anaphylaxis due to food    Asthma    CKD (chronic kidney disease)    COPD (chronic obstructive pulmonary disease) (HCC)    Diabetes mellitus without complication (HCC)    DM (diabetes mellitus) (HCC)    Granuloma of tympanic membrane of left ear    Hypertension    Myringotomy tube status    Nasal polyposis    OSA (obstructive sleep apnea)    Osteopenia    Otorrhagia    Pansinusitis    Sleep apnea    uses CPAP   Uterine leiomyoma     Past Surgical History:  Procedure Laterality Date   COLONOSCOPY     DILATION AND CURETTAGE OF UTERUS     NASAL SINUS SURGERY     TONSILLECTOMY  1975   TYMPANOSTOMY TUBE PLACEMENT      Social History[1]  Family History  Problem Relation Age of Onset   Stroke Father    Heart disease Father  Diabetes Mother    Heart disease Mother    Stroke Mother    Hyperlipidemia Sister    Hyperlipidemia Brother    Diabetes Paternal Grandmother    Hypertension Brother    Colon cancer Neg Hx    Allergic rhinitis Neg Hx    Angioedema Neg Hx    Asthma Neg Hx    Atopy Neg Hx    Eczema Neg Hx    Immunodeficiency Neg Hx    Urticaria Neg Hx     Allergies[2]  Medication list has been reviewed and updated.  Medications Ordered Prior to Encounter[3]  Review of Systems:  As per HPI- otherwise  negative.   Physical Examination: There were no vitals filed for this visit. There were no vitals filed for this visit. There is no height or weight on file to calculate BMI. Ideal Body Weight:    GEN: no acute distress. HEENT: Atraumatic, Normocephalic.  Ears and Nose: No external deformity. CV: RRR, No M/G/R. No JVD. No thrill. No extra heart sounds. PULM: CTA B, no wheezes, crackles, rhonchi. No retractions. No resp. distress. No accessory muscle use. ABD: S, NT, ND, +BS. No rebound. No HSM. EXTR: No c/c/e PSYCH: Normally interactive. Conversant.    Assessment and Plan: No diagnosis found.  Assessment & Plan   Signed Harlene Schroeder, MD    [1]  Social History Tobacco Use   Smoking status: Never   Smokeless tobacco: Never  Vaping Use   Vaping status: Never Used  Substance Use Topics   Alcohol use: No    Alcohol/week: 0.0 standard drinks of alcohol   Drug use: No  [2]  Allergies Allergen Reactions   Doxycycline  Nausea Only   Loratadine Other (See Comments)    Dizziness   Other Other (See Comments)    Magic mouthwash  Patient get blisters on her lips  [3]  Current Outpatient Medications on File Prior to Visit  Medication Sig Dispense Refill   Accu-Chek Softclix Lancets lancets Check blood sugar once daily 100 each 12   aluminum chloride (DRYSOL) 20 % external solution Apply topically at bedtime. (Patient not taking: Reported on 05/30/2024) 60 mL 2   amLODipine -valsartan  (EXFORGE ) 5-320 MG tablet TAKE 1 TABLET BY MOUTH EVERY DAY 90 tablet 3   aspirin  EC 81 MG tablet Take 1 tablet (81 mg total) by mouth daily. Swallow whole.     blood glucose meter kit and supplies Accucheck test strips and meter. Use up to four times daily as directed. (FOR ICD-10 E10.9, E11.9). 1 each 0   Cholecalciferol (VITAMIN D3) 1.25 MG (50000 UT) CAPS Take 1 weekly for 12 weeks 12 capsule 0   dupilumab  (DUPIXENT ) 300 MG/2ML prefilled syringe INJECT 1 SYRINGE UNDER THE SKIN EVERY OTHER  WEEK 12 mL 3   EPINEPHrine  (EPIPEN  2-PAK) 0.3 mg/0.3 mL IJ SOAJ injection Inject 0.3 mg into the muscle as needed for anaphylaxis. 1 each 1   FLOVENT  HFA 110 MCG/ACT inhaler Inhale 2 puffs into the lungs 2 (two) times daily. 1 each 5   glucose blood (ACCU-CHEK GUIDE TEST) test strip Use to check glucose 1 or 2 times daily as needed 100 each 12   glucose blood (ACCU-CHEK GUIDE) test strip USE TO CHECK BLOOD SUGAR ONCE DAILY 50 strip 12   Hydroquinone-Sunscreens (ALPHAQUIN HP) 4 % CREA APPLY TO DARK SPOTS DAILY.  3   levalbuterol  (XOPENEX  HFA) 45 MCG/ACT inhaler TAKE 2 PUFFS BY MOUTH EVERY 6 HOURS AS NEEDED FOR WHEEZE 15 g 1  metFORMIN  (GLUCOPHAGE ) 500 MG tablet Take 1 tablet (500 mg total) by mouth daily. 90 tablet 3   mometasone  (NASONEX ) 50 MCG/ACT nasal spray Place 1-2 sprays into the nose 2 (two) times daily. 17 g 5   NASAL SALINE NA Place 1 spray into the nose daily. Neilmed Nasal Saline Rinse to use 1-2 times daily.     pantoprazole  (PROTONIX ) 40 MG tablet Take 1 tablet (40 mg total) by mouth daily. 90 tablet 3   rosuvastatin  (CRESTOR ) 20 MG tablet Take 1 tablet (20 mg total) by mouth daily. 90 tablet 3   Current Facility-Administered Medications on File Prior to Visit  Medication Dose Route Frequency Provider Last Rate Last Admin   dupilumab  (DUPIXENT ) prefilled syringe 300 mg  300 mg Subcutaneous Q14 Days Kozlow, Eric J, MD   300 mg at 05/30/24 1510

## 2024-06-05 ENCOUNTER — Encounter: Payer: Self-pay | Admitting: Family Medicine

## 2024-06-05 ENCOUNTER — Ambulatory Visit: Admitting: Family Medicine

## 2024-06-05 VITALS — BP 142/70 | HR 81 | Ht 68.0 in | Wt 156.2 lb

## 2024-06-05 DIAGNOSIS — E1121 Type 2 diabetes mellitus with diabetic nephropathy: Secondary | ICD-10-CM

## 2024-06-05 DIAGNOSIS — J4540 Moderate persistent asthma, uncomplicated: Secondary | ICD-10-CM

## 2024-06-05 DIAGNOSIS — D649 Anemia, unspecified: Secondary | ICD-10-CM

## 2024-06-05 DIAGNOSIS — E119 Type 2 diabetes mellitus without complications: Secondary | ICD-10-CM | POA: Diagnosis not present

## 2024-06-05 DIAGNOSIS — I10 Essential (primary) hypertension: Secondary | ICD-10-CM | POA: Diagnosis not present

## 2024-06-05 DIAGNOSIS — E785 Hyperlipidemia, unspecified: Secondary | ICD-10-CM

## 2024-06-05 DIAGNOSIS — Z23 Encounter for immunization: Secondary | ICD-10-CM | POA: Diagnosis not present

## 2024-06-05 DIAGNOSIS — J4550 Severe persistent asthma, uncomplicated: Secondary | ICD-10-CM

## 2024-06-05 DIAGNOSIS — Z1329 Encounter for screening for other suspected endocrine disorder: Secondary | ICD-10-CM

## 2024-06-05 DIAGNOSIS — Z7984 Long term (current) use of oral hypoglycemic drugs: Secondary | ICD-10-CM

## 2024-06-05 LAB — LIPID PANEL
Cholesterol: 151 mg/dL (ref 0–200)
HDL: 53.6 mg/dL (ref >39.00)
LDL Cholesterol: 84 mg/dL (ref 0–99)
NonHDL: 97.81
Total CHOL/HDL Ratio: 3
Triglycerides: 70 mg/dL (ref 0.0–149.0)
VLDL: 14 mg/dL (ref 0.0–40.0)

## 2024-06-05 LAB — COMPREHENSIVE METABOLIC PANEL WITH GFR
ALT: 22 U/L (ref 0–35)
AST: 23 U/L (ref 0–37)
Albumin: 4.5 g/dL (ref 3.5–5.2)
Alkaline Phosphatase: 65 U/L (ref 39–117)
BUN: 14 mg/dL (ref 6–23)
CO2: 30 meq/L (ref 19–32)
Calcium: 9.8 mg/dL (ref 8.4–10.5)
Chloride: 102 meq/L (ref 96–112)
Creatinine, Ser: 1.01 mg/dL (ref 0.40–1.20)
GFR: 57.74 mL/min — ABNORMAL LOW (ref >60.00)
Glucose, Bld: 94 mg/dL (ref 70–99)
Potassium: 3.9 meq/L (ref 3.5–5.1)
Sodium: 141 meq/L (ref 135–145)
Total Bilirubin: 0.4 mg/dL (ref 0.2–1.2)
Total Protein: 7.4 g/dL (ref 6.0–8.3)

## 2024-06-05 LAB — CBC
HCT: 36.4 % (ref 36.0–46.0)
Hemoglobin: 12.1 g/dL (ref 12.0–15.0)
MCHC: 33.3 g/dL (ref 30.0–36.0)
MCV: 80.9 fl (ref 78.0–100.0)
Platelets: 207 K/uL (ref 150.0–400.0)
RBC: 4.5 Mil/uL (ref 3.87–5.11)
RDW: 15.3 % (ref 11.5–15.5)
WBC: 5.5 K/uL (ref 4.0–10.5)

## 2024-06-05 LAB — MICROALBUMIN / CREATININE URINE RATIO
Creatinine,U: 99.5 mg/dL
Microalb Creat Ratio: 874.6 mg/g — ABNORMAL HIGH (ref 0.0–30.0)
Microalb, Ur: 87.1 mg/dL — ABNORMAL HIGH (ref 0.0–1.9)

## 2024-06-05 LAB — HEMOGLOBIN A1C: Hgb A1c MFr Bld: 6.9 % — ABNORMAL HIGH (ref 4.6–6.5)

## 2024-06-05 LAB — TSH: TSH: 1.1 u[IU]/mL (ref 0.35–5.50)

## 2024-06-05 MED ORDER — AMLODIPINE BESYLATE-VALSARTAN 5-320 MG PO TABS: 1.0000 | ORAL_TABLET | Freq: Every day | ORAL | 3 refills | Status: AC

## 2024-06-05 MED ORDER — ROSUVASTATIN CALCIUM 20 MG PO TABS: 20.0000 mg | ORAL_TABLET | Freq: Every day | ORAL | 3 refills | Status: AC

## 2024-06-05 MED ORDER — METFORMIN HCL 500 MG PO TABS: 500.0000 mg | ORAL_TABLET | Freq: Every day | ORAL | 3 refills | Status: AC

## 2024-06-13 ENCOUNTER — Ambulatory Visit

## 2024-06-13 DIAGNOSIS — J4540 Moderate persistent asthma, uncomplicated: Secondary | ICD-10-CM

## 2024-06-19 ENCOUNTER — Ambulatory Visit: Admitting: Family Medicine

## 2024-06-28 ENCOUNTER — Ambulatory Visit

## 2024-06-28 DIAGNOSIS — J4540 Moderate persistent asthma, uncomplicated: Secondary | ICD-10-CM

## 2024-07-05 ENCOUNTER — Other Ambulatory Visit: Payer: Self-pay | Admitting: Family Medicine

## 2024-07-05 DIAGNOSIS — Z1231 Encounter for screening mammogram for malignant neoplasm of breast: Secondary | ICD-10-CM

## 2024-07-11 ENCOUNTER — Ambulatory Visit

## 2024-07-11 DIAGNOSIS — J4540 Moderate persistent asthma, uncomplicated: Secondary | ICD-10-CM | POA: Diagnosis not present

## 2024-07-25 ENCOUNTER — Ambulatory Visit

## 2024-07-25 DIAGNOSIS — J3089 Other allergic rhinitis: Secondary | ICD-10-CM

## 2024-07-25 DIAGNOSIS — J4540 Moderate persistent asthma, uncomplicated: Secondary | ICD-10-CM

## 2024-08-09 ENCOUNTER — Ambulatory Visit

## 2024-08-14 ENCOUNTER — Ambulatory Visit: Admitting: Cardiovascular Disease

## 2024-08-17 ENCOUNTER — Ambulatory Visit

## 2024-12-04 ENCOUNTER — Ambulatory Visit: Admitting: Family Medicine

## 2025-05-29 ENCOUNTER — Ambulatory Visit: Admitting: Allergy and Immunology

## 2025-05-31 ENCOUNTER — Ambulatory Visit

## 2025-06-05 ENCOUNTER — Ambulatory Visit: Admitting: Allergy and Immunology
